# Patient Record
Sex: Female | Born: 1965 | Race: Black or African American | Hispanic: No | Smoking: Never smoker
Health system: Southern US, Community
[De-identification: ages and names within clinical notes are randomized; demographics above are authoritative.]

## PROBLEM LIST (undated history)

## (undated) DIAGNOSIS — M329 Systemic lupus erythematosus, unspecified: Secondary | ICD-10-CM

## (undated) DIAGNOSIS — I10 Essential (primary) hypertension: Secondary | ICD-10-CM

## (undated) DIAGNOSIS — M797 Fibromyalgia: Secondary | ICD-10-CM

## (undated) HISTORY — PX: ABDOMINAL HYSTERECTOMY: SHX81

## (undated) HISTORY — PX: ANKLE SURGERY: SHX546

## (undated) HISTORY — PX: CHOLECYSTECTOMY: SHX55

## (undated) HISTORY — PX: OTHER SURGICAL HISTORY: SHX169

---

## 1988-06-06 DIAGNOSIS — M329 Systemic lupus erythematosus, unspecified: Secondary | ICD-10-CM

## 1988-06-06 DIAGNOSIS — IMO0002 Reserved for concepts with insufficient information to code with codable children: Secondary | ICD-10-CM

## 1988-06-06 HISTORY — DX: Systemic lupus erythematosus, unspecified: M32.9

## 1988-06-06 HISTORY — DX: Reserved for concepts with insufficient information to code with codable children: IMO0002

## 2004-11-11 ENCOUNTER — Emergency Department: Payer: Self-pay | Admitting: Unknown Physician Specialty

## 2005-07-20 ENCOUNTER — Emergency Department: Payer: Self-pay | Admitting: Emergency Medicine

## 2006-01-27 ENCOUNTER — Ambulatory Visit: Payer: Self-pay

## 2006-02-21 ENCOUNTER — Ambulatory Visit: Payer: Self-pay | Admitting: Internal Medicine

## 2006-02-28 ENCOUNTER — Ambulatory Visit: Payer: Self-pay | Admitting: Specialist

## 2006-12-20 ENCOUNTER — Ambulatory Visit: Payer: Self-pay | Admitting: Pain Medicine

## 2007-01-07 ENCOUNTER — Emergency Department: Payer: Self-pay | Admitting: Internal Medicine

## 2007-01-10 ENCOUNTER — Ambulatory Visit: Payer: Self-pay

## 2007-06-16 ENCOUNTER — Emergency Department: Payer: Self-pay | Admitting: Emergency Medicine

## 2008-03-18 ENCOUNTER — Ambulatory Visit: Payer: Self-pay | Admitting: Family Medicine

## 2008-05-20 ENCOUNTER — Emergency Department: Payer: Self-pay | Admitting: Emergency Medicine

## 2008-07-13 ENCOUNTER — Emergency Department: Payer: Self-pay | Admitting: Internal Medicine

## 2008-12-31 ENCOUNTER — Emergency Department: Payer: Self-pay | Admitting: Unknown Physician Specialty

## 2009-02-24 ENCOUNTER — Ambulatory Visit: Payer: Self-pay | Admitting: Family Medicine

## 2010-03-19 ENCOUNTER — Emergency Department: Payer: Self-pay | Admitting: Internal Medicine

## 2010-04-13 ENCOUNTER — Inpatient Hospital Stay: Payer: Self-pay | Admitting: Unknown Physician Specialty

## 2010-04-21 LAB — PATHOLOGY REPORT

## 2011-05-20 ENCOUNTER — Emergency Department: Payer: Self-pay | Admitting: Emergency Medicine

## 2011-07-09 ENCOUNTER — Emergency Department: Payer: Self-pay | Admitting: Emergency Medicine

## 2011-07-29 ENCOUNTER — Emergency Department: Payer: Self-pay | Admitting: Emergency Medicine

## 2011-11-15 ENCOUNTER — Emergency Department: Payer: Self-pay | Admitting: Emergency Medicine

## 2011-12-11 ENCOUNTER — Ambulatory Visit: Payer: Self-pay | Admitting: Obstetrics

## 2012-02-24 ENCOUNTER — Inpatient Hospital Stay: Payer: Self-pay | Admitting: Internal Medicine

## 2012-02-24 LAB — COMPREHENSIVE METABOLIC PANEL
Albumin: 3.3 g/dL — ABNORMAL LOW (ref 3.4–5.0)
Alkaline Phosphatase: 97 U/L (ref 50–136)
Anion Gap: 10 (ref 7–16)
BUN: 7 mg/dL (ref 7–18)
Bilirubin,Total: 1.5 mg/dL — ABNORMAL HIGH (ref 0.2–1.0)
Calcium, Total: 9 mg/dL (ref 8.5–10.1)
Co2: 26 mmol/L (ref 21–32)
Creatinine: 0.77 mg/dL (ref 0.60–1.30)
EGFR (African American): >60
EGFR (Non-African Amer.): >60
Osmolality: 277 (ref 275–301)
Potassium: 3.6 mmol/L (ref 3.5–5.1)
Sodium: 140 mmol/L (ref 136–145)
Total Protein: 8.2 g/dL (ref 6.4–8.2)

## 2012-02-24 LAB — CBC
HCT: 39.4 % (ref 35.0–47.0)
HGB: 13.2 g/dL (ref 12.0–16.0)
MCH: 32.9 pg (ref 26.0–34.0)
MCHC: 33.6 g/dL (ref 32.0–36.0)
MCV: 98 fL (ref 80–100)
Platelet: 190 10*3/uL (ref 150–440)
RBC: 4.01 10*6/uL (ref 3.80–5.20)
WBC: 3.5 10*3/uL — ABNORMAL LOW (ref 3.6–11.0)

## 2012-02-24 LAB — URINALYSIS, COMPLETE
Blood: NEGATIVE
Ketone: NEGATIVE
Nitrite: NEGATIVE
Protein: 100
Specific Gravity: 1.044 (ref 1.003–1.030)
Squamous Epithelial: 21
WBC UR: 18 /HPF (ref 0–5)

## 2012-02-24 LAB — ACETAMINOPHEN LEVEL: Acetaminophen: 23 ug/mL

## 2012-02-24 LAB — LIPASE, BLOOD: Lipase: 389 U/L (ref 73–393)

## 2012-02-25 LAB — CBC WITH DIFFERENTIAL/PLATELET
Eosinophil %: 0 %
HGB: 10.9 g/dL — ABNORMAL LOW (ref 12.0–16.0)
Lymphocyte %: 12.1 %
MCH: 33.8 pg (ref 26.0–34.0)
Monocyte #: 0.1 x10 3/mm — ABNORMAL LOW (ref 0.2–0.9)
Monocyte %: 5.4 %
Neutrophil %: 82.2 %
Platelet: 116 10*3/uL — ABNORMAL LOW (ref 150–440)
WBC: 1.7 10*3/uL — CL (ref 3.6–11.0)

## 2012-02-25 LAB — COMPREHENSIVE METABOLIC PANEL
Albumin: 2.5 g/dL — ABNORMAL LOW (ref 3.4–5.0)
Alkaline Phosphatase: 68 U/L (ref 50–136)
Bilirubin,Total: 0.9 mg/dL (ref 0.2–1.0)
Calcium, Total: 7.7 mg/dL — ABNORMAL LOW (ref 8.5–10.1)
Glucose: 88 mg/dL (ref 65–99)
SGOT(AST): >2000 U/L — ABNORMAL HIGH (ref 15–37)

## 2012-02-25 LAB — RAPID HIV-1/2 QL/CONFIRM: HIV-1/2,Rapid Ql: NEGATIVE

## 2012-02-26 LAB — HEPATIC FUNCTION PANEL A (ARMC)
Albumin: 2 g/dL — ABNORMAL LOW (ref 3.4–5.0)
Alkaline Phosphatase: 64 U/L (ref 50–136)
SGOT(AST): 544 U/L — ABNORMAL HIGH (ref 15–37)
SGPT (ALT): 334 U/L — ABNORMAL HIGH (ref 12–78)
Total Protein: 5.5 g/dL — ABNORMAL LOW (ref 6.4–8.2)

## 2012-02-26 LAB — CBC WITH DIFFERENTIAL/PLATELET
HCT: 29.1 % — ABNORMAL LOW (ref 35.0–47.0)
HGB: 10 g/dL — ABNORMAL LOW (ref 12.0–16.0)
MCH: 33.6 pg (ref 26.0–34.0)
MCV: 98 fL (ref 80–100)
Platelet: 99 10*3/uL — ABNORMAL LOW (ref 150–440)
RBC: 2.97 10*6/uL — ABNORMAL LOW (ref 3.80–5.20)
Segmented Neutrophils: 76 %
WBC: 1 10*3/uL — CL (ref 3.6–11.0)

## 2012-02-26 LAB — PROTIME-INR
INR: 1.2
Prothrombin Time: 15.4 secs — ABNORMAL HIGH (ref 11.5–14.7)

## 2012-02-26 LAB — URINE CULTURE

## 2012-02-26 LAB — POTASSIUM: Potassium: 2.9 mmol/L — ABNORMAL LOW (ref 3.5–5.1)

## 2012-02-27 LAB — CBC WITH DIFFERENTIAL/PLATELET
Basophil #: 0 10*3/uL (ref 0.0–0.1)
Basophil %: 0.2 %
Eosinophil %: 0 %
HCT: 28.7 % — ABNORMAL LOW (ref 35.0–47.0)
HGB: 9.6 g/dL — ABNORMAL LOW (ref 12.0–16.0)
Lymphocyte %: 19.5 %
MCHC: 33.4 g/dL (ref 32.0–36.0)
MCV: 99 fL (ref 80–100)
Monocyte %: 10.9 %
Neutrophil #: 1 10*3/uL — ABNORMAL LOW (ref 1.4–6.5)
Platelet: 88 10*3/uL — ABNORMAL LOW (ref 150–440)
RDW: 15.2 % — ABNORMAL HIGH (ref 11.5–14.5)
WBC: 1.4 10*3/uL — CL (ref 3.6–11.0)

## 2012-02-27 LAB — COMPREHENSIVE METABOLIC PANEL
Albumin: 2.1 g/dL — ABNORMAL LOW (ref 3.4–5.0)
Anion Gap: 9 (ref 7–16)
BUN: 2 mg/dL — ABNORMAL LOW (ref 7–18)
Bilirubin,Total: 0.4 mg/dL (ref 0.2–1.0)
Chloride: 113 mmol/L — ABNORMAL HIGH (ref 98–107)
Creatinine: 0.56 mg/dL — ABNORMAL LOW (ref 0.60–1.30)
EGFR (African American): >60
Potassium: 3.8 mmol/L (ref 3.5–5.1)
SGOT(AST): 204 U/L — ABNORMAL HIGH (ref 15–37)
SGPT (ALT): 233 U/L — ABNORMAL HIGH (ref 12–78)
Sodium: 145 mmol/L (ref 136–145)
Total Protein: 5.7 g/dL — ABNORMAL LOW (ref 6.4–8.2)

## 2012-02-27 LAB — FOLATE: Folic Acid: 10 ng/mL (ref 3.1–100.0)

## 2012-02-27 LAB — PROTIME-INR: Prothrombin Time: 13.1 secs (ref 11.5–14.7)

## 2012-02-28 LAB — CBC WITH DIFFERENTIAL/PLATELET
Eosinophil #: 0 10*3/uL (ref 0.0–0.7)
Eosinophil %: 0 %
HCT: 31.4 % — ABNORMAL LOW (ref 35.0–47.0)
Lymphocyte #: 0.2 10*3/uL — ABNORMAL LOW (ref 1.0–3.6)
Lymphocyte %: 9 %
MCH: 32.8 pg (ref 26.0–34.0)
MCV: 99 fL (ref 80–100)
Monocyte #: 0.1 x10 3/mm — ABNORMAL LOW (ref 0.2–0.9)
Monocyte %: 4.1 %
Neutrophil #: 2 10*3/uL (ref 1.4–6.5)
Neutrophil %: 86.9 %
Platelet: 122 10*3/uL — ABNORMAL LOW (ref 150–440)
RDW: 14.7 % — ABNORMAL HIGH (ref 11.5–14.5)

## 2012-02-28 LAB — HEPATIC FUNCTION PANEL A (ARMC)
Albumin: 2.6 g/dL — ABNORMAL LOW (ref 3.4–5.0)
Alkaline Phosphatase: 116 U/L (ref 50–136)
Bilirubin, Direct: 0.2 mg/dL (ref 0.00–0.20)
Bilirubin,Total: 0.4 mg/dL (ref 0.2–1.0)
Total Protein: 6.3 g/dL — ABNORMAL LOW (ref 6.4–8.2)

## 2012-02-28 LAB — WBCS, STOOL

## 2012-02-29 LAB — COMPREHENSIVE METABOLIC PANEL
Albumin: 2.4 g/dL — ABNORMAL LOW (ref 3.4–5.0)
Alkaline Phosphatase: 93 U/L (ref 50–136)
Anion Gap: 8 (ref 7–16)
BUN: 4 mg/dL — ABNORMAL LOW (ref 7–18)
Creatinine: 0.66 mg/dL (ref 0.60–1.30)
EGFR (Non-African Amer.): >60
Glucose: 114 mg/dL — ABNORMAL HIGH (ref 65–99)
Potassium: 4.1 mmol/L (ref 3.5–5.1)
SGPT (ALT): 146 U/L — ABNORMAL HIGH (ref 12–78)
Sodium: 143 mmol/L (ref 136–145)
Total Protein: 6.1 g/dL — ABNORMAL LOW (ref 6.4–8.2)

## 2012-02-29 LAB — CBC WITH DIFFERENTIAL/PLATELET
Basophil #: 0 10*3/uL (ref 0.0–0.1)
Basophil %: 0.1 %
Eosinophil %: 0 %
HCT: 31 % — ABNORMAL LOW (ref 35.0–47.0)
Lymphocyte %: 5.3 %
MCH: 32.8 pg (ref 26.0–34.0)
Monocyte #: 0.5 x10 3/mm (ref 0.2–0.9)
Monocyte %: 6.8 %
Neutrophil %: 87.8 %
Platelet: 147 10*3/uL — ABNORMAL LOW (ref 150–440)

## 2012-03-02 LAB — CULTURE, BLOOD (SINGLE)

## 2012-03-13 ENCOUNTER — Observation Stay: Payer: Self-pay | Admitting: Internal Medicine

## 2012-03-13 LAB — SALICYLATE LEVEL: Salicylates, Serum: <1.7 mg/dL

## 2012-03-13 LAB — URINALYSIS, COMPLETE
Glucose,UR: NEGATIVE mg/dL (ref 0–75)
Nitrite: NEGATIVE
Ph: 5 (ref 4.5–8.0)
Protein: 30
RBC,UR: <1 /HPF (ref 0–5)
Specific Gravity: 1.046 (ref 1.003–1.030)
WBC UR: 2 /HPF (ref 0–5)

## 2012-03-13 LAB — COMPREHENSIVE METABOLIC PANEL
Albumin: 3.5 g/dL (ref 3.4–5.0)
BUN: 4 mg/dL — ABNORMAL LOW (ref 7–18)
EGFR (African American): >60
Glucose: 83 mg/dL (ref 65–99)
Potassium: 4.1 mmol/L (ref 3.5–5.1)
Sodium: 143 mmol/L (ref 136–145)
Total Protein: 7.8 g/dL (ref 6.4–8.2)

## 2012-03-13 LAB — CBC
HGB: 12.5 g/dL (ref 12.0–16.0)
MCH: 33.2 pg (ref 26.0–34.0)
MCV: 97 fL (ref 80–100)
RBC: 3.76 10*6/uL — ABNORMAL LOW (ref 3.80–5.20)

## 2012-03-13 LAB — DRUG SCREEN, URINE
Amphetamines, Ur Screen: NEGATIVE (ref ?–1000)
Barbiturates, Ur Screen: NEGATIVE (ref ?–200)
Benzodiazepine, Ur Scrn: NEGATIVE (ref ?–200)
Cannabinoid 50 Ng, Ur ~~LOC~~: NEGATIVE (ref ?–50)
Methadone, Ur Screen: NEGATIVE (ref ?–300)
Opiate, Ur Screen: NEGATIVE (ref ?–300)
Phencyclidine (PCP) Ur S: NEGATIVE (ref ?–25)
Tricyclic, Ur Screen: NEGATIVE (ref ?–1000)

## 2012-03-13 LAB — PROTIME-INR: Prothrombin Time: 12.8 secs (ref 11.5–14.7)

## 2012-03-13 LAB — ACETAMINOPHEN LEVEL: Acetaminophen: 18 ug/mL

## 2012-03-14 LAB — CBC WITH DIFFERENTIAL/PLATELET
Eosinophil %: 0.2 %
HGB: 11 g/dL — ABNORMAL LOW (ref 12.0–16.0)
Lymphocyte #: 0.5 10*3/uL — ABNORMAL LOW (ref 1.0–3.6)
Lymphocyte %: 19.6 %
MCV: 99 fL (ref 80–100)
Monocyte %: 11.2 %
Platelet: 168 10*3/uL (ref 150–440)
RBC: 3.34 10*6/uL — ABNORMAL LOW (ref 3.80–5.20)
RDW: 13 % (ref 11.5–14.5)
WBC: 2.6 10*3/uL — ABNORMAL LOW (ref 3.6–11.0)

## 2012-03-14 LAB — LIPASE, BLOOD: Lipase: 247 U/L (ref 73–393)

## 2012-03-14 LAB — COMPREHENSIVE METABOLIC PANEL
Anion Gap: 10 (ref 7–16)
BUN: 2 mg/dL — ABNORMAL LOW (ref 7–18)
Bilirubin,Total: 0.9 mg/dL (ref 0.2–1.0)
Calcium, Total: 7.4 mg/dL — ABNORMAL LOW (ref 8.5–10.1)
Chloride: 108 mmol/L — ABNORMAL HIGH (ref 98–107)
Co2: 23 mmol/L (ref 21–32)
Creatinine: 0.84 mg/dL (ref 0.60–1.30)
EGFR (African American): >60
EGFR (Non-African Amer.): >60
Osmolality: 276 (ref 275–301)
Potassium: 3.1 mmol/L — ABNORMAL LOW (ref 3.5–5.1)

## 2012-03-28 ENCOUNTER — Emergency Department: Payer: Self-pay | Admitting: Emergency Medicine

## 2012-04-03 ENCOUNTER — Ambulatory Visit: Payer: Self-pay | Admitting: Pain Medicine

## 2012-05-23 ENCOUNTER — Ambulatory Visit: Payer: Self-pay | Admitting: Pain Medicine

## 2012-12-11 DIAGNOSIS — M797 Fibromyalgia: Secondary | ICD-10-CM | POA: Insufficient documentation

## 2012-12-11 DIAGNOSIS — M329 Systemic lupus erythematosus, unspecified: Secondary | ICD-10-CM | POA: Insufficient documentation

## 2012-12-11 DIAGNOSIS — D61818 Other pancytopenia: Secondary | ICD-10-CM | POA: Insufficient documentation

## 2013-01-10 DIAGNOSIS — M2550 Pain in unspecified joint: Secondary | ICD-10-CM | POA: Insufficient documentation

## 2013-03-01 ENCOUNTER — Emergency Department: Payer: Self-pay | Admitting: Emergency Medicine

## 2013-03-01 LAB — CBC WITH DIFFERENTIAL/PLATELET
Basophil #: 0 10*3/uL (ref 0.0–0.1)
Basophil %: 0.4 %
Eosinophil #: 0 10*3/uL (ref 0.0–0.7)
Eosinophil %: 0.3 %
HCT: 33.6 % — ABNORMAL LOW (ref 35.0–47.0)
HGB: 11.7 g/dL — ABNORMAL LOW (ref 12.0–16.0)
Lymphocyte #: 0.5 10*3/uL — ABNORMAL LOW (ref 1.0–3.6)
Lymphocyte %: 21 %
MCH: 38 pg — ABNORMAL HIGH (ref 26.0–34.0)
MCHC: 34.8 g/dL (ref 32.0–36.0)
MCV: 109 fL — ABNORMAL HIGH (ref 80–100)
Monocyte #: 0.3 x10 3/mm (ref 0.2–0.9)
Monocyte %: 10.7 %
Neutrophil #: 1.8 10*3/uL (ref 1.4–6.5)
Neutrophil %: 67.6 %
Platelet: 175 10*3/uL (ref 150–440)
RBC: 3.07 10*6/uL — ABNORMAL LOW (ref 3.80–5.20)
RDW: 13.2 % (ref 11.5–14.5)
WBC: 2.6 10*3/uL — ABNORMAL LOW (ref 3.6–11.0)

## 2013-03-01 LAB — COMPREHENSIVE METABOLIC PANEL
Albumin: 3.7 g/dL (ref 3.4–5.0)
Alkaline Phosphatase: 80 U/L (ref 50–136)
Anion Gap: 6 — ABNORMAL LOW (ref 7–16)
BUN: 9 mg/dL (ref 7–18)
Bilirubin,Total: 1.1 mg/dL — ABNORMAL HIGH (ref 0.2–1.0)
Calcium, Total: 8.5 mg/dL (ref 8.5–10.1)
Chloride: 105 mmol/L (ref 98–107)
Co2: 26 mmol/L (ref 21–32)
Creatinine: 0.93 mg/dL (ref 0.60–1.30)
EGFR (African American): >60
EGFR (Non-African Amer.): >60
Glucose: 93 mg/dL (ref 65–99)
Osmolality: 272 (ref 275–301)
Potassium: 3.8 mmol/L (ref 3.5–5.1)
SGOT(AST): 27 U/L (ref 15–37)
SGPT (ALT): 19 U/L (ref 12–78)
Sodium: 137 mmol/L (ref 136–145)
Total Protein: 7.8 g/dL (ref 6.4–8.2)

## 2013-03-01 LAB — LIPASE, BLOOD: Lipase: 256 U/L (ref 73–393)

## 2013-04-29 DIAGNOSIS — E538 Deficiency of other specified B group vitamins: Secondary | ICD-10-CM | POA: Insufficient documentation

## 2013-07-02 IMAGING — CT CT ABD-PELV W/ CM
1 of 2 series · 15 of 32 positions shown, 19 images · non-contrast
Comparison: none

REASON FOR EXAM: (1) severe abd pain, vomiting; (2) severe abd pain,
vomiting
COMMENTS:

[Series 2: soft tissue · axial · 0.69mm/px · z∈[-456,-38]mm · 15 of 153 slices shown, 19 images]
[im 7/153  soft-tissue]
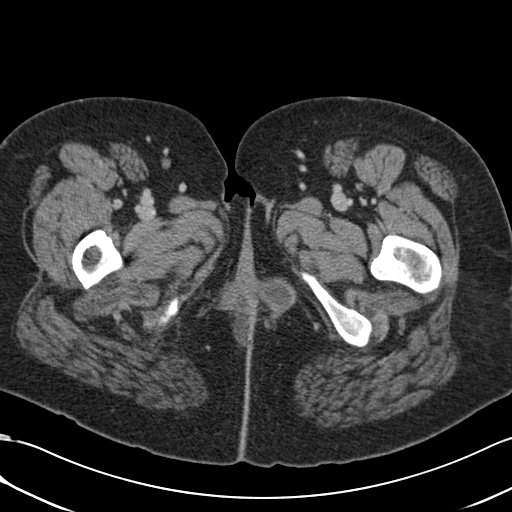
[im 7/153  bone]
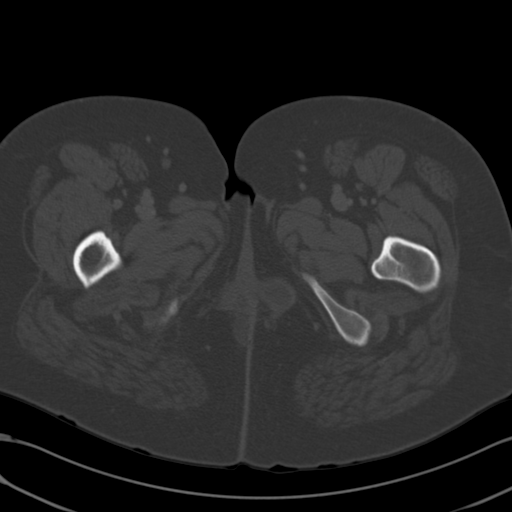
[im 19/153  soft-tissue]
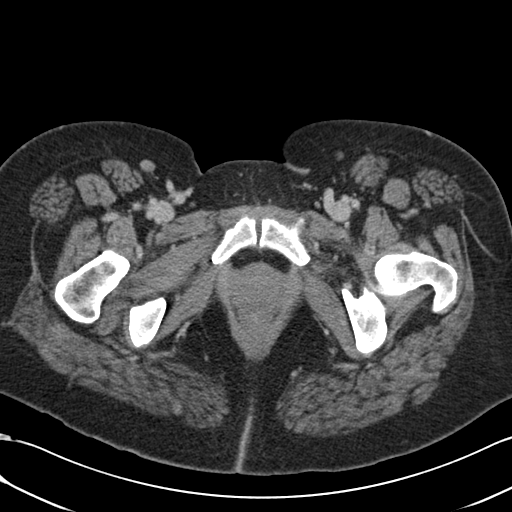
[im 31/153  soft-tissue]
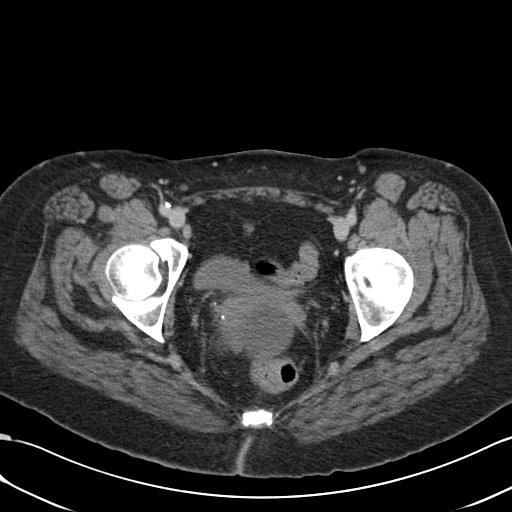
[im 43/153  soft-tissue]
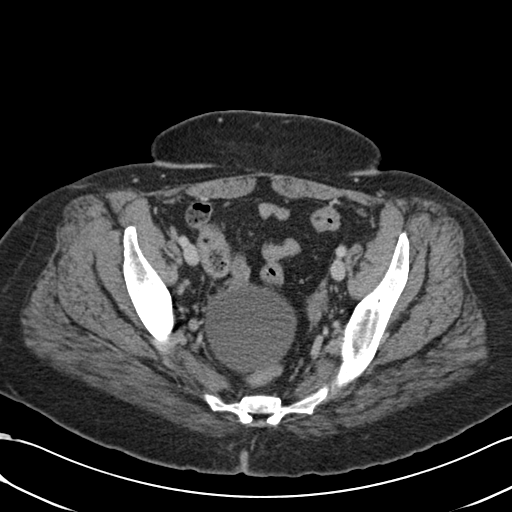
[im 55/153  soft-tissue]
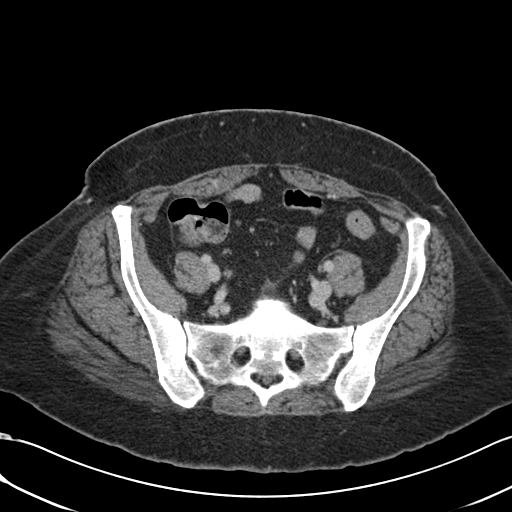
[im 67/153  soft-tissue]
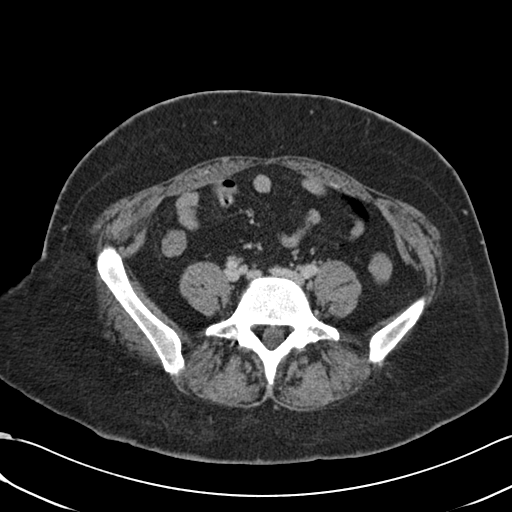
[im 80/153  soft-tissue]
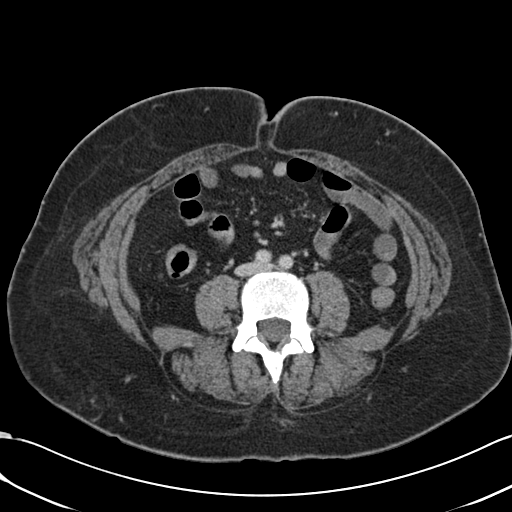
[im 86/153  soft-tissue]
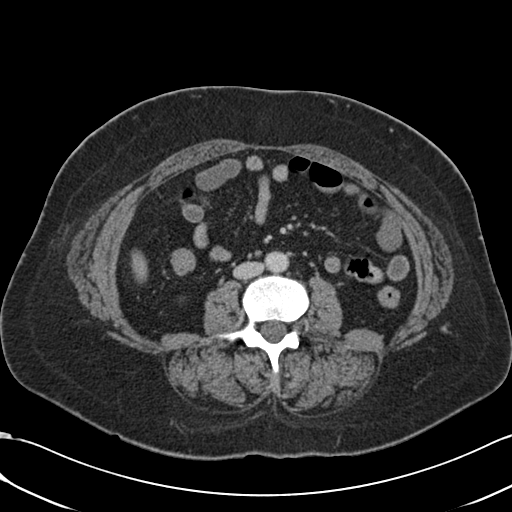
[im 98/153  soft-tissue]
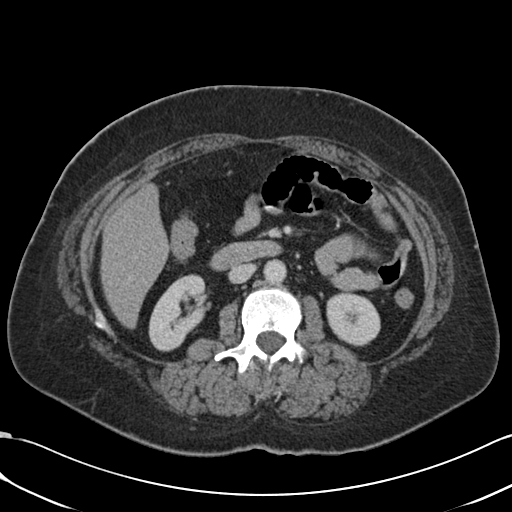
[im 98/153  bone]
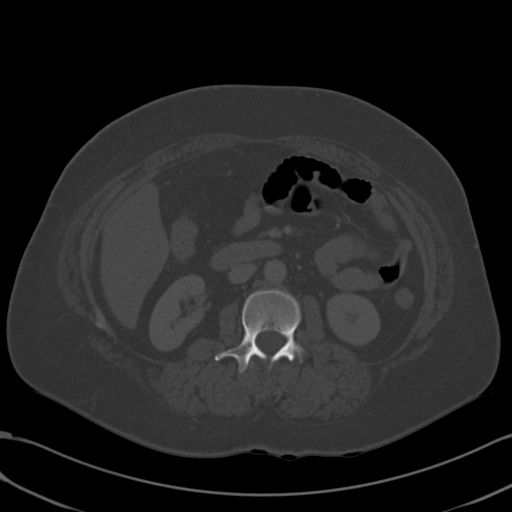
[im 110/153  soft-tissue]
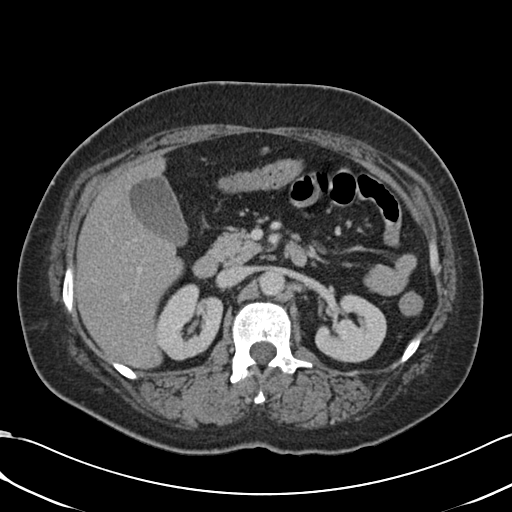
[im 122/153  soft-tissue]
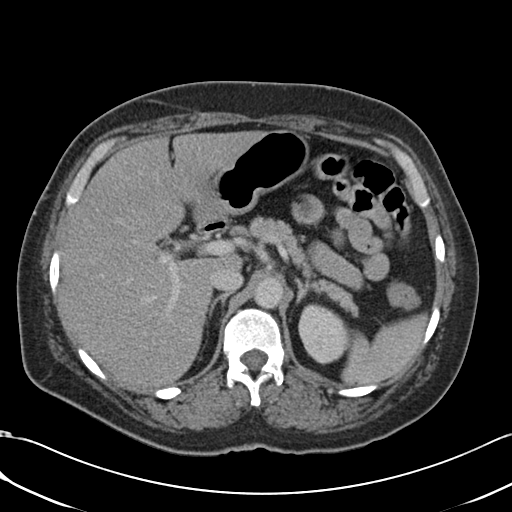
[im 128/153  lung]
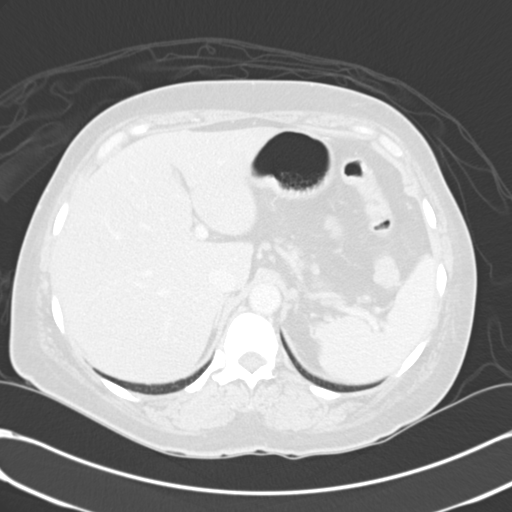
[im 134/153  soft-tissue]
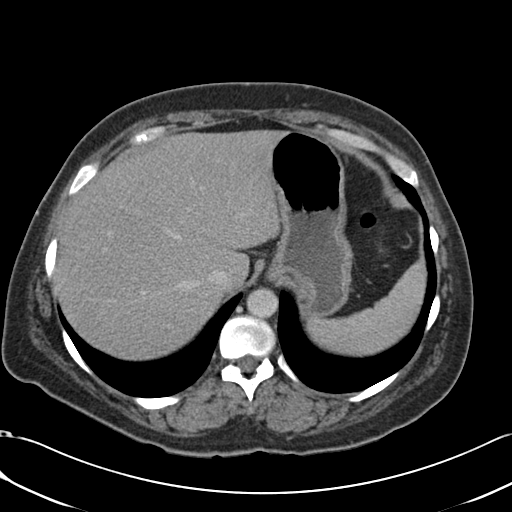
[im 134/153  lung]
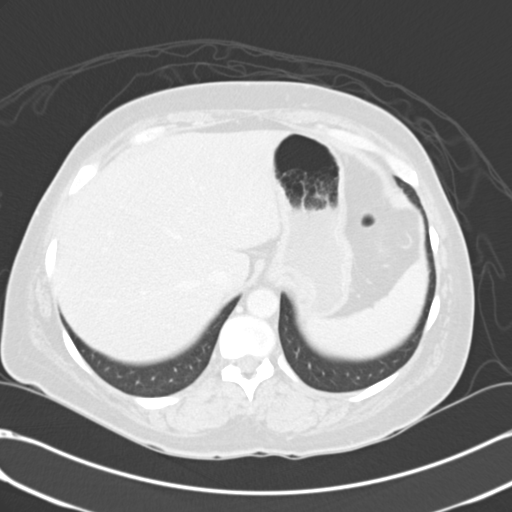
[im 140/153  lung]
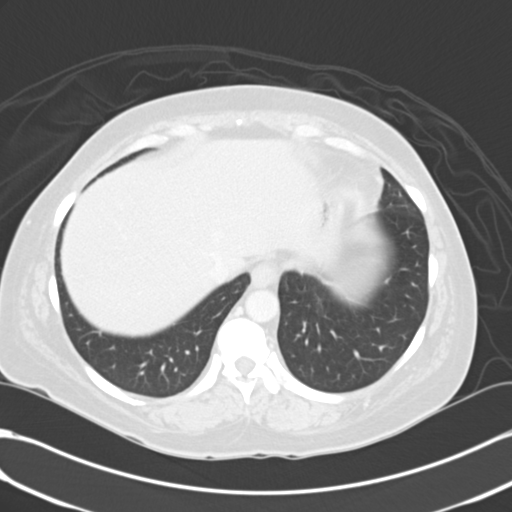
[im 146/153  soft-tissue]
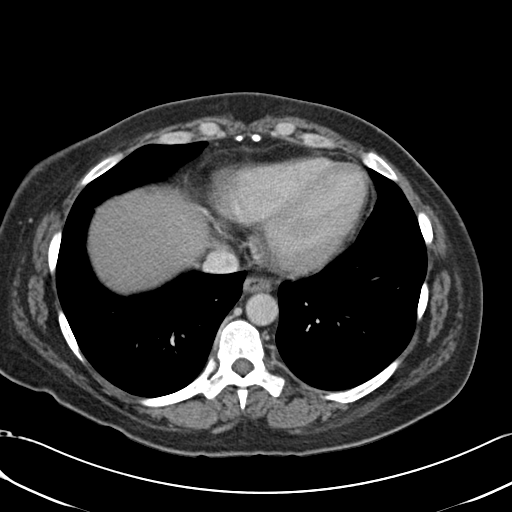
[im 146/153  lung]
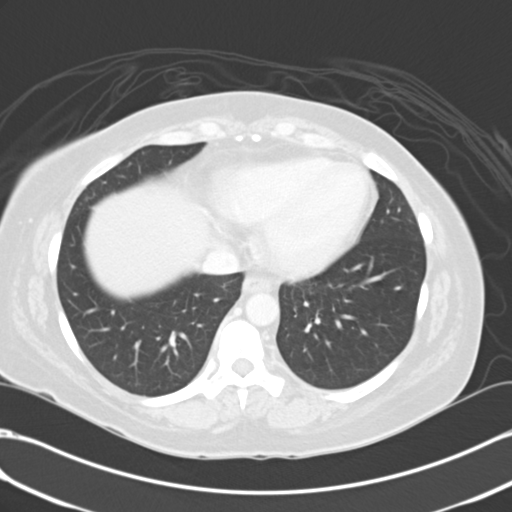

[15 of 32 positions shown; findings below may reference images not displayed]

PROCEDURE:     CT  - CT ABDOMEN / PELVIS  W  - March 13, 2012  [DATE]

RESULT:     CT of the abdomen and pelvis is performed with 85 mL of
Jsovue-WMM iodinated intravenous contrast. Images are reconstructed at 3 mm
slice thickness in the axial plane. Syngo Via is utilized for further
assessment with multiplanar reconstruction. There is previous study dated 13 April, 2010.

There is a fluid collection with a Hounsfield reading of 7 in the region of
the uterus anterior to the rectum measuring 7.75 x 6.52 x 5.71 cm. There is
no definite hematocrit effect. No air is seen within this fluid collection.
The appendix is normal. No abnormal bowel distention is present. Small
stones are seen in the dependent portion the gallbladder. The liver, spleen,
pancreas, adrenal glands and kidneys appear to be unremarkable. No
adenopathy is evident. No pneumoperitoneum or pneumatosis is evident 3 small
fat filled umbilical hernia is present. The urinary bladder contains a
minimal amount of urine. Bony structures appear unremarkable.
IMPRESSION: Midline fluid collection the prerectal region. No air-fluid
level is seen. No thickened wall is seen to suggest an abscess. Pelvic
ultrasound may be beneficial for further clarification. Rib there is
reportedly previous hysterectomy. Normal-appearing appendix is seen adjacent
to this. Small fat filled umbilical hernia present. Cholelithiasis is
demonstrated. The lung bases appear clear.

[REDACTED]

## 2013-07-08 DIAGNOSIS — I10 Essential (primary) hypertension: Secondary | ICD-10-CM | POA: Insufficient documentation

## 2013-07-08 DIAGNOSIS — N644 Mastodynia: Secondary | ICD-10-CM | POA: Insufficient documentation

## 2013-10-09 DIAGNOSIS — F4310 Post-traumatic stress disorder, unspecified: Secondary | ICD-10-CM | POA: Insufficient documentation

## 2013-12-16 ENCOUNTER — Emergency Department: Payer: Self-pay | Admitting: Emergency Medicine

## 2013-12-23 DIAGNOSIS — G4700 Insomnia, unspecified: Secondary | ICD-10-CM | POA: Insufficient documentation

## 2014-05-04 ENCOUNTER — Inpatient Hospital Stay: Payer: Self-pay | Admitting: Orthopedic Surgery

## 2014-05-04 LAB — URINALYSIS, COMPLETE
BILIRUBIN, UR: NEGATIVE
BLOOD: NEGATIVE
Bacteria: NONE SEEN
GLUCOSE, UR: NEGATIVE mg/dL (ref 0–75)
KETONE: NEGATIVE
NITRITE: NEGATIVE
PROTEIN: NEGATIVE
Ph: 7 (ref 4.5–8.0)
RBC,UR: 7 /HPF (ref 0–5)
Specific Gravity: 1.01 (ref 1.003–1.030)
WBC UR: <1 /HPF (ref 0–5)

## 2014-05-04 LAB — BASIC METABOLIC PANEL
ANION GAP: 8 (ref 7–16)
BUN: 13 mg/dL (ref 7–18)
Calcium, Total: 8.4 mg/dL — ABNORMAL LOW (ref 8.5–10.1)
Chloride: 109 mmol/L — ABNORMAL HIGH (ref 98–107)
Co2: 23 mmol/L (ref 21–32)
Creatinine: 0.9 mg/dL (ref 0.60–1.30)
EGFR (African American): >60
EGFR (Non-African Amer.): >60
GLUCOSE: 98 mg/dL (ref 65–99)
OSMOLALITY: 279 (ref 275–301)
Potassium: 4.5 mmol/L (ref 3.5–5.1)
Sodium: 140 mmol/L (ref 136–145)

## 2014-05-04 LAB — CBC
HCT: 33.9 % — ABNORMAL LOW (ref 35.0–47.0)
HGB: 11.2 g/dL — ABNORMAL LOW (ref 12.0–16.0)
MCH: 30.1 pg (ref 26.0–34.0)
MCHC: 33 g/dL (ref 32.0–36.0)
MCV: 91 fL (ref 80–100)
Platelet: 222 10*3/uL (ref 150–440)
RBC: 3.72 10*6/uL — ABNORMAL LOW (ref 3.80–5.20)
RDW: 12.3 % (ref 11.5–14.5)
WBC: 6.1 10*3/uL (ref 3.6–11.0)

## 2014-05-04 LAB — ETHANOL: Ethanol: <3 mg/dL

## 2014-05-04 LAB — PROTIME-INR
INR: 0.9
PROTHROMBIN TIME: 12.2 s (ref 11.5–14.7)

## 2014-05-08 LAB — PLATELET COUNT: Platelet: 234 10*3/uL (ref 150–440)

## 2014-05-08 LAB — BASIC METABOLIC PANEL
Anion Gap: 8 (ref 7–16)
BUN: 5 mg/dL — AB (ref 7–18)
CHLORIDE: 108 mmol/L — AB (ref 98–107)
CREATININE: 1.02 mg/dL (ref 0.60–1.30)
Calcium, Total: 7.9 mg/dL — ABNORMAL LOW (ref 8.5–10.1)
Co2: 24 mmol/L (ref 21–32)
EGFR (African American): >60
EGFR (Non-African Amer.): >60
Glucose: 124 mg/dL — ABNORMAL HIGH (ref 65–99)
OSMOLALITY: 278 (ref 275–301)
Potassium: 3.5 mmol/L (ref 3.5–5.1)
Sodium: 140 mmol/L (ref 136–145)

## 2014-05-08 LAB — HEMOGLOBIN: HGB: 9.8 g/dL — ABNORMAL LOW (ref 12.0–16.0)

## 2014-09-23 NOTE — Consult Note (Signed)
PATIENT NAME:  Ana [name], CAPERS MR#:  [id number] DATE OF BIRTH:  1966-03-13  DATE OF CONSULTATION:  02/25/2012  REFERRING PHYSICIAN:   CONSULTING PHYSICIAN:  Lupita Dawn. Genee Rann, MD  REASON FOR REFERRAL: Abnormal liver enzymes.   DESCRIPTION: The patient is a 49 year old black female with a known history of lupus and fibromyalgia causing diffuse body aches and abdominal pains. Because of insurance the patient has seen various different doctors in different locations. Unfortunately, she stopped taking lupus medication several months ago. She does not know the names of the medications. Also, because of the pain, she started taking her at least 2 grams of Tylenol every six hours for the past four months because of fibromyalgia pain. The patient now comes in with at least several days of low abdominal pain associated with nausea and diarrhea and fevers. The patient was found to have very abnormal liver enzymes which are getting worse. I was consulted because of this.   The patient has chronic history of diarrhea and abdominal pain in the past. She was hospitalized back in 2011 where she had GI work-up. Ultrasound at that time showed gallstones without evidence of cholecystitis. Colonoscopy was normal with normal biopsies. When she came in this time ultrasound again showed gallstones but no signs of cholecystitis. She has decreased appetite and has lost about 30 pounds over the last few months. Yesterday temperature was up to 101. She is complaining of some dark cloudy urine as well. Other complaints include headaches and dizziness as well as muscle tenderness.   PAST MEDICAL HISTORY:  1. Lupus. 2. Fibromyalgia.  3. Hypertension. 4. Hyperlipidemia.   SOCIAL HISTORY: She denies any drinking or smoking or using illicit drugs.   FAMILY HISTORY: Notable for diabetes in her mother.   PAST SURGICAL HISTORY: There is no prior surgical history.  MEDICATIONS: The only medication she takes at home is Tylenol  Extra-Strength 500 mg four tablets every six hours as needed.   ALLERGIES: She has no known drug allergies.   REVIEW OF SYMPTOMS: Again, she has had fevers and chills and headaches and dizziness and generalized weakness. There are no visual or hearing changes. There is no coughing or shortness of breath. There is no chest pain or palpitations. The GI symptoms have been described already. The patient denies any gross hematochezia or melena. She has had some dark urine but no dysuria. Her muscles ache all over.  PHYSICAL EXAMINATION:   GENERAL: The patient is in no acute distress.   VITAL SIGNS: She was afebrile. Vital signs were stable. Initial blood pressure was 134/66.   HEENT: Normocephalic, atraumatic head. Pupils are equally reactive. Throat was clear.   NECK: Supple.   CARDIAC: Regular rhythm and rate without murmurs.   RESPIRATORY: Lungs are clear bilaterally. She has diffuse tenderness, but is mostly in the lower abdomen, not in the right upper quadrant area. There is no rebound or guarding.   EXTREMITIES: No clubbing, cyanosis, or edema.   SKIN: Negative.   NEUROLOGICAL: Examination was nonfocal.  LABORATORY, DIAGNOSTIC AND RADIOLOGIC DATA: Ultrasound showed multiple gallstones and some sludge, but again there is no evidence of acute cholecystitis.   Urinalysis showed 18 WBCs, nitrites were negative.   Sodium 140, potassium 3.6, chloride 104, CO2 26, BUN 7, and creatinine 0.77. Bilirubin 1.5, AST 1676 and ALT 342,000. Alkaline phosphatase is normal at 97. White count is 3.5, hemoglobin 13.2, and platelet count is 190. This morning her liver enzymes are worse. AST is greater than 2000 and  ALT is 701. Lipase was 389. Tylenol level was 23.   ASSESSMENT AND PLAN: This is a patient with acute hepatitis. AST or ALT is greater than 1000 we need to be worried about toxins from medications. Even though the Tylenol level was within normal limits, she has been taking high doses of  Tylenol for the past four months, therefore, we should be worried about Tylenol overdose. I agree with starting the patient on acetylcysteine drip. Would also need to rule out other causes such as viral hepatitis or even autoimmune hepatitis in light of her lupus history. It is important that we monitor her liver enzymes closely on a daily basis. I will also recommend checking daily pro time level as well as platelet count. We also need to monitor her mental status closely. If these functions worsen over the next few days, I would recommend that she be transferred to [facility] hepatology division for further evaluation. Thank you for the referral.  ____________________________ Lupita Dawn. Candace Cruise, MD pyo:slb D: 02/26/2012 08:30:00 ET T: 02/26/2012 12:18:00 ET JOB#: [id number]  cc: Lupita Dawn. Candace Cruise, MD, <Dictator> Lupita Dawn Braylynn Lewing MD ELECTRONICALLY SIGNED 02/27/2012 11:02

## 2014-09-23 NOTE — Consult Note (Signed)
PATIENT NAME:  Ana [name], Ana [name] MR#:  [id number] DATE OF BIRTH:  [birth year]  DATE OF CONSULTATION:  02/27/2012  REFERRING PHYSICIAN:  Dr Karsten Fells.   CONSULTING PHYSICIAN:  Emmaline Kluver., MD PERSONAL PHYSICIAN: Ashok Norris, MD   REASON FOR CONSULTATION: Lupus.   HISTORY OF PRESENT ILLNESS: A 49 year old [place] American female. She has had prior discoid lupus and longstanding alopecia. She was told at age 30 with hair loss, fever, and positive ANA, she had lupus. She said she was placed on some medicines at that time, may be Plaquenil and methotrexate but not Imuran or Cytoxan. She did not have definite kidney disease. She had been followed at [facility]. She had been on chronic analgesics. In direct questioning she thinks she has heard of CellCept, and she used to be on Plaquenil but taken off that. She was on prednisone for years. She used see Dr. Jerrol Banana, was transferred to Internal Medicine upon his retirement. She has not taken prednisone for a good 3 months or more, does not remember the other drugs she was on except analgesics. She has recently had nausea, vomiting, diarrhea, lost 30 pounds of weight, some episodic fever. She has had prior workup for abnormal liver functions and prior workup for abnormal blood counts by her report. She has had recent hospitalization with nausea and vomiting. Last GOT was greater than 2000. Creatinine was normal. CRP mildly elevated. Pro time normal. When she came in her white count was 3500; it has dropped to 1400, and hemoglobin has dropped to 9.6, platelets have dropped to 88,000. She has been on antibiotics here. Anti-DNA negative.  C4 normal C3 of 68.  FANA positive. CRP 30. Gallbladder ultrasound showed gallstones and possible fatty liver. She has 100 mg/dL protein in the urine. Seen has seen Dr. Holley Raring in the past for "urinary tract infections." She has some muscle aches. No definite recent skin rash, shortness of breath, chest pain, or joint swelling.   PAST  MEDICAL HISTORY:  Depression, lupus, bipolar, hysterectomy, C-section.   MEDICATIONS:  Was initially given acetylcysteine drip for possible Tylenol toxicity. She is on Cipro, Flagyl, morphine, is now eating some, no vomiting, had fever when she came in, now normal. She was placed on 10 mg of prednisone. She had not had any recurrent fevers.   SOCIAL HISTORY: Denies significant cigarettes or alcohol.   FAMILY HISTORY: Negative for the connective tissue diseases.   REVIEW OF SYSTEMS: As above.   PHYSICAL EXAMINATION:  GENERAL: Pleasant female in no acute distress.   VITAL SIGNS: Temperature 98, pulse 77, respiratory rate 20, 143/94, oxygen 97.   SKIN: With alopecia. No active discoid lesions of the face. No telangiectasias of the hands. No malar rash. Oropharynx clear.   NECK: No thyromegaly.   CHEST: Clear.   HEART: No murmur.   ABDOMEN: Diffuse abdominal tenderness, positive bowel sounds.   EXTREMITIES: No significant edema.   SKELETAL: Hands without synovitis. Elbows move well. Small cool knee effusions. Hips move well. Walks without cane or walker.   PSYCHIATRIC: Normal mentation.   IMPRESSION:  1. Prior history of lupus. Not sure that this current illness is that. Her DNA is negative. C3, C4 are normal. CRP is not that high. She was not on chronic prednisone.  2. Possibly recent weight loss contributed to by been relatively addisonian given her prior history of chronic steroids and then being off of them.  3. Abdominal pain with transaminitis. Ultrasound shows fatty liver that she said she has had  abnormal liver functions before but obviously had an acute insult with a transaminase greater than 2000, now coming down to normal.  4. Pancytopenia. She said she has had trouble with her blood counts before, do not know whether this is new or old. If this were immune, she might respond on steroids. If this is a burned out marrow from prior drugs or some other drug medicine toxicity  then she could have other hematologic diseases.     PLAN: Agree with transfer to [facility]. Would boost the prednisone at 60. Watch her heme counts.  If she stays here and does not come up we have Hematology see. Sent other labs, serum protein electrophoresis, B12, folate, haptoglobin, LDH, Coombs.    Thank you.    ____________________________ Emmaline Kluver., MD gwk:vtd D: 02/27/2012 14:00:42 ET T: 02/27/2012 14:17:14 ET JOB#: [id number]  cc: Emmaline Kluver., MD, <Dictator> Ashok Norris, MD Lupita Dawn. Candace Cruise, MD Ovidio Hanger MD ELECTRONICALLY SIGNED 02/27/2012 18:14

## 2014-09-23 NOTE — Discharge Summary (Signed)
PATIENT NAME:  Ana Ana [name], Ana Ana [name] MR#:  [id number] DATE OF BIRTH:  [birth year]  DATE OF ADMISSION:  03/13/2012 DATE OF DISCHARGE:  03/15/2012  DISCHARGE DIAGNOSES:  1. Mild pancreatitis resolved.  2. Fibromyalgia. 3. Systemic lupus erythematosus.  4. Hypertension.  5. Chronic pain syndrome. 6. History of overuse of Tylenol.   DISPOSITION: The patient is being discharged home. Outpatient follow-up for the patient at [facility] with Dr. Dwyane Dee and follow-up with pain management clinic has been made for the patient. The patient has been given the address, phone number, appointment times and dates for both the appointments.   DIET: Low sodium, low fat, low cholesterol.   ACTIVITY: As tolerated.   DISCHARGE MEDICATIONS:  1. Prednisone 10 mg daily.  2. Oxycodone 10 mg q.6 hours p.r.n.  3. Tramadol 50 mg every six hours p.r.n.  4. Promethazine 25 mg every six hours p.r.n.   RESULTS: CT of the abdomen showed no acute abnormalities No evidence of pancreatitis. Tylenol and salicylate levels were within normal. Urinalysis showed no evidence of infection. CBC showed white count of 3.3 to 2.6, hemoglobin 12.6 to 11, platelet count normal. Lipase 695 on admission, 247 by the time of discharge. AST 46, normal by the time of discharge. Urine drug screen negative. The rest of BMP normal.   HOSPITAL COURSE: The patient is Ana [name] 49 year old female with past medical history of hypertension, not on any medications, SLE, fibromyalgia and depression who had Ana [name] prolonged hospitalization in September 2013 for Tylenol overdose related to chronic ingestion of high doses of Tylenol. She has history of lupus and fibromyalgia and had stopped following at [facility] several months ago because she was not getting any prescription for narcotic pain medications. She was overtreating herself at that time with Tylenol. During that hospitalization in September she had received Mucomyst. She was also pancytopenic, possibly felt to  be due to her underlying lupus. Dr. Jefm [name] had seen her during that admission and had recommended treatment with steroids and tapering the dose to prednisone 10 mg which she was supposed to continue. The patient appears to have stopped taking that medication as an outpatient. She was also advised to follow-up at [facility] for her complicated medical problems, but did not do so. During this admission she presented with abdominal pain and reported fever. Throughout the hospitalization she has remained afebrile. She was found to found to have mildly elevated lipase and admitted as acute pancreatitis. Within 24 hours her lipase had normalized. Her CT scan was normal. Her LFTs were normal. She had no abdominal pain, nausea, vomiting or fever. Her diet was advanced which she was tolerating without any difficulty. Her blood pressure was stable off any medications. The patient has been restarted on prednisone and advised to take that. The patient acknowledged that she had pain control issues. She went to different hospitals whenever she ran out of her narcotic pain medications and possibly went into withdrawal. She is interested in following up at pain management clinic and having outpatient control of her chronic pain issues. She reported that she was trying to get an appointment at [facility], but was unable to do so. During this admission the pain clinic has been contacted and an appointment for the patient with Dr. Dossie Arbour at the pain clinic has been made. Ana [name] follow-up appointment for the patient to see Dr. Dwyane Dee at [facility] has also been made. The patient has been given details about the appointment dates, times, addresses and phone  numbers of the clinic and advised to be compliant with the medications. She has been given Ana [name] prescription for narcotic pain medications until she can follow up at the pain management clinic on 10/21 at 8:00 Ana [name].m.   TIME SPENT: 45 minutes.     ____________________________ Cherre Huger, MD sp:ap D: 03/15/2012 12:24:29 ET T: 03/15/2012 13:42:17 ET JOB#: [id number]  cc: Cherre Huger, MD, <Dictator> Dr. Dwyane Dee, [facility] Cherre Huger MD ELECTRONICALLY SIGNED 03/16/2012 12:08

## 2014-09-23 NOTE — H&P (Signed)
PATIENT NAME:  Ana [name], BRANSCOM MR#:  [id number] DATE OF BIRTH:  August 27, 1965  DATE OF ADMISSION:  02/24/2012  PRIMARY CARE PHYSICIAN: None local. REFERRING PHYSICIAN: Dr Bonney Roussel.  CHIEF COMPLAINT: Abdominal pain, nausea, vomiting for 2 months, worsening for 4 days.   HISTORY OF PRESENT ILLNESS: A 49 year old [place] American female with a history of hypertension, hyperlipidemia, fibromyalgia who presented to the ED with abdominal pain, nausea, and vomiting for the past 2 months and worsening for the past 4 days. The patient complains of abdominal pain, which is the lower part, aching, constant, radiation to the back associated with nausea, vomiting for the past 2 months. In addition, she has diarrhea which is loose multiple times a day for the past 2 months. She said that she is unable to eat, and lost weight for about 30 pounds. She also has a fever, chills. Yesterday temperature was 101. She says that she has dark cloudy urine. She also complain of headache, dizziness, muscle tenderness.  PAST MEDICAL HISTORY: Hypertension, hyperlipidemia, lupus, fibromyalgia, depression, obesity.   SOCIAL HISTORY: Denies any smoking or drinking or illicit drugs.   FAMILY HISTORY:  Mother has diabetes. No heart attack, stroke, or hypertension or cancer in her family.   PAST SURGICAL HISTORY: No.    HOME MEDICATIONS:  Tylenol Caplets Extra Strength 500 mg p.o. 4 tablets every 6 hours p.r.n.   ALLERGIES: No.    REVIEW OF SYSTEMS: CONSTITUTIONAL: The patient has a fever, chills, headache, dizziness, generalized weakness. EYES: No double vision or blurred vision. ENT: No epistaxis, postnasal drip, or slurred speech. RESPIRATORY: No cough, sputum, shortness of breath, or hematemesis. CARDIOVASCULAR: No chest pain, palpitation, orthopnea, or nocturnal dyspnea. No leg edema. EYES: Positive for abdominal pain, nausea, vomiting, diarrhea. No melena or bloody stool. GENITOURINARY: No dysuria, hematuria, or incontinence,  but has dark urine, cloudy urine. ENDOCRINE: No polyuria, polydipsia. HEMATOLOGY: No easy bruising or bleeding. MUSCULOSKELETAL: Has some muscle pain. No leg edema. NEUROLOGY: No syncope, loss of consciousness, or seizure. SKIN: No rash or jaundice.   PHYSICAL EXAMINATION:  VITAL SIGNS: Temperature 99, blood pressure 134/66, pulse 96, O2 saturation 99% in room air.   GENERAL: The patient is alert, awake, oriented, in no acute distress.   HEENT: Pupils round, equal, reactive to light and accommodation.    NECK: Supple. No JVD or carotid bruit. No adenopathy. No thyromegaly. Moist oral mucosa. Clear oropharynx.   CARDIOVASCULAR: S1, S2, regular rate, rhythm. No murmurs or gallops.   PULMONARY: Bilateral air entry. No wheezing or rales.   ABDOMEN: Soft. No distention. Has diffuse tenderness. No rigidity. No rebound. No organomegaly.   EXTREMITIES: No edema, clubbing, or cyanosis but has extremity tenderness on the back tenderness all over. Strong bilateral pedal pulses.   SKIN: No rash or jaundice.   NEUROLOGY: Alert and oriented x3. No focal deficit. Power 5/5. Sensation intact. Deep tendon reflexes 2+.   STUDIES: Ultrasound of abdomen showed multiple gallstones present as well as sludge.  No  evidence of acute cholecystitis. Urinalysis showed WBC 18, RBCs 3, nitrite is negative. CBC showed WBC 3.5, hemoglobin 13.2, platelets 190,000. Glucose 99, BUN 7, creatinine 0.77. Electrolytes normal. Bilirubin 1.5, SGPT 342, SGOT 1676, lipase 389. Acetaminophen 23.   IMPRESSION:  1. Abdominal pain with nausea, vomiting, diarrhea, unknown etiology. Need to rule out colitis.  2. Urinary tract infection.  3. Abnormal liver function tests.  4. Cholelithiasis.   5. Hypertension, controlled.  6. Fibromyalgia. 7. Lupus.   8. Hyperlipidemia.  9.  Obesity.   PLAN OF TREATMENT:  1. The patient will be admitted to the medical floor. We will start clear liquid, and we will get a GI consult and follow up  complete metabolic panel, hepatitis panel, HIV test, stool culture and sensitivity, stool Clostridium difficile, and stool ova and parasites.  2. We will give morphine and Zofran p.r.n. for abdominal pain, nausea, and vomiting.  3. Will give Cipro, Flagyl to treat urinary tract infection and a possible colitis and follow up a urine culture.  4. Gastrointestinal and deep vein thrombosis prophylaxis. Discussed the patient's situation and plan of treatment with the patient.   TIME SPENT: About 58 minutes.   ____________________________ Demetrios Loll, MD qc:vtd D: 02/24/2012 19:53:20 ET T: 02/25/2012 07:50:10 ET JOB#: [id number]  cc: Demetrios Loll, MD, <Dictator> Demetrios Loll MD ELECTRONICALLY SIGNED 02/27/2012 13:04

## 2014-09-23 NOTE — Consult Note (Signed)
Pt seen and examined. Full consult to follow.Pt with lupus and fibromyalgia. No lupus meds for several months. Taking 2g tylenol q 6hrs x 4 months for fibromyalgia pain. Now with low abd pain, nausea, diarrhea, fever, etc. LFT high and getting worse. Pt with chronic  hx of diarrhea and abd pain. W/U in 2011 incl U/S showing gallstones. Colonoscopy in 2011 was normal with normal biopsies. U/S again shows gallstones but no evidence of cholecystitis. Agree with blood cx and stool cx. Agree with braod spectrum Abx for now. Agree with acetylcysteine drip for tylenol overdose, even if tylenol level normal. R/O other causes, such as viral hepatitis, autoimmune hepatitis, lupus flare, etc. For moniter LFT closely, daily protime, and platelet count. Also, moniter mental status closely as well. If these functions worsen over next few days, then would recommend transfer to [facility] hepatology division. Will follow. Thanks.    Electronic Signatures: Verdie Shire (MD) (Signed on 21-Sep-13 10:20)  Authored   Last Updated: 22-Sep-13 10:25 by Verdie Shire (MD)

## 2014-09-23 NOTE — Consult Note (Signed)
Brief Consult Note: Patient was seen by consultant.   Consult note dictated.   Discussed with Attending MD.   Comments: 1.cytopenia, may be chronic vs acute, immmune vs marrow failure. claims to have had abnl blood counts before  2 . transaminits, prior hx of abnl lfts , ? prior bx  3. chronic steriods but off x 3 mo. some of recent wt loss and GI sx may have been addisons  4 hx SLE,current neg ANTIDNA, she is unsure of prior meds but off plaquenil and immunosuppression for some time         rec 1 labs sent, boost prednisone to 60 mg qd and watch her counts, agree with transfer a fup with [facility], if stays here and counts stay low, rec heme opinion.  Electronic Signatures: Leeanne Mannan., Eugene Gavia (MD)  (Signed 23-Sep-13 13:53)  Authored: Brief Consult Note   Last Updated: 23-Sep-13 13:53 by Leeanne Mannan., Eugene Gavia (MD)

## 2014-09-23 NOTE — Consult Note (Signed)
Chief Complaint:   Subjective/Chief Complaint Overall better. Low dose prednisone started yest for poss lupus flare. LFT improving.   VITAL SIGNS/ANCILLARY NOTES: **Vital Signs.:   23-Sep-13 05:32   Vital Signs Type Routine   Temperature Temperature (F) 98   Celsius 36.6   Temperature Source Oral   Pulse Pulse 77   Respirations Respirations 20   Systolic BP Systolic BP 579   Diastolic BP (mmHg) Diastolic BP (mmHg) 94   Mean BP 110   Pulse Ox % Pulse Ox % 97   Pulse Ox Activity Level  At rest   Oxygen Delivery Room Air/ 21 %   Brief Assessment:   Cardiac Regular    Respiratory clear BS    Gastrointestinal RUQ area less tender   Lab Results: Hepatic:  23-Sep-13 04:51    Bilirubin, Total 0.4   Alkaline Phosphatase 107   SGPT (ALT)  233   SGOT (AST)  204   Total Protein, Serum  5.7   Albumin, Serum  2.1  Routine Chem:  23-Sep-13 04:51    Result Comment wbc - CRITICAL VALUE PREVIOUSLY NOTIFIED. wbc - RESULTS VERIFIED BY REPEAT TESTING. differential - DUE TO THE LOW WBC, THE INSTRUMENT DIFF  - CANNOT BE CONFIRMED BY MANUAL DIFF AND  - IS REPORTED PRIMARILY TO IDENTIFY CELL  - TYPES PRESENT.  Result(s) reported on 27 Feb 2012 at 06:16AM.   Glucose, Serum  102   BUN  2   Creatinine (comp)  0.56   Sodium, Serum 145   Potassium, Serum 3.8   Chloride, Serum  113   CO2, Serum 23   Calcium (Total), Serum  7.7   Osmolality (calc) 285   eGFR (African American) >60   eGFR (Non-African American) >60 (eGFR values <40m/min/1.73 m2 may be an indication of chronic kidney disease (CKD). Calculated eGFR is useful in patients with stable renal function. The eGFR calculation will not be reliable in acutely ill patients when serum creatinine is changing rapidly. It is not useful in  patients on dialysis. The eGFR calculation may not be applicable to patients at the low and high extremes of body sizes, pregnant women, and vegetarians.)   Anion Gap 9   Magnesium, Serum  1.7  (1.8-2.4 THERAPEUTIC RANGE: 4-7 mg/dL TOXIC: > 10 mg/dL  -----------------------)  Routine Coag:  23-Sep-13 04:51    Prothrombin 13.1   INR 1.0 (INR reference interval applies to patients on anticoagulant therapy. A single INR therapeutic range for coumarins is not optimal for all indications; however, the suggested range for most indications is 2.0 - 3.0. Exceptions to the INR Reference Range may include: Prosthetic heart valves, acute myocardial infarction, prevention of myocardial infarction, and combinations of aspirin and anticoagulant. The need for a higher or lower target INR must be assessed individually. Reference: The Pharmacology and Management of the Vitamin K  antagonists: the seventh ACCP Conference on Antithrombotic and Thrombolytic Therapy. CUXYBF.3832Sept:126 (3suppl): [id number] A HCT value >55% may artifactually increase the PT.  In one study,  the increase was an average of 25%. Reference:  "Effect on Routine and Special Coagulation Testing Values of Citrate Anticoagulant Adjustment in Patients with High HCT Values." American Journal of Clinical Pathology 2006;126:400-405.)  Routine Hem:  23-Sep-13 04:51    WBC (CBC)  1.4   RBC (CBC)  2.91   Hemoglobin (CBC)  9.6   Hematocrit (CBC)  28.7   Platelet Count (CBC)  88   MCV 99   MCH 33.0   MCHC 33.4  RDW  15.2   Neutrophil % 69.4   Lymphocyte % 19.5   Monocyte % 10.9   Eosinophil % 0.0   Basophil % 0.2   Neutrophil #  1.0   Lymphocyte #  0.3   Monocyte # 0.2   Eosinophil # 0.0   Basophil # 0.0   Assessment/Plan:  Assessment/Plan:   Assessment LFT elevation. improving. On prednisone for lupus.    Plan Rheum consult pending. Awaiting transfer to [facility]. I will be out at [facility] tomorrow but will check on Wed if patient still here. Thanks.   Electronic Signatures: Verdie Shire (MD)  (Signed 23-Sep-13 13:54)  Authored: Chief Complaint, VITAL SIGNS/ANCILLARY NOTES, Brief Assessment, Lab Results,  Assessment/Plan   Last Updated: 23-Sep-13 13:54 by Verdie Shire (MD)

## 2014-09-23 NOTE — Consult Note (Signed)
Chief Complaint:   Subjective/Chief Complaint Feels better. LFT finally improving. Lupus flare?   VITAL SIGNS/ANCILLARY NOTES: **Vital Signs.:   22-Sep-13 04:50   Vital Signs Type Routine   Temperature Temperature (F) 97.6   Celsius 36.4   Temperature Source Oral   Pulse Pulse 74   Respirations Respirations 18   Systolic BP Systolic BP 470   Diastolic BP (mmHg) Diastolic BP (mmHg) 80   Mean BP 97   Pulse Ox % Pulse Ox % 96   Pulse Ox Activity Level  At rest   Oxygen Delivery Room Air/ 21 %   Brief Assessment:   Cardiac Regular    Respiratory clear BS    Gastrointestinal upper abd tenderness   Lab Results: Hepatic:  22-Sep-13 02:49    Bilirubin, Total 0.7   Bilirubin, Direct  0.3 (Result(s) reported on 26 Feb 2012 at 07:59AM.)   Alkaline Phosphatase 64   SGPT (ALT)  334   SGOT (AST)  544   Total Protein, Serum  5.5   Albumin, Serum  2.0  General Ref:  22-Sep-13 02:49    Acetaminophen, Serum  5 (10-30 POTENTIALLY TOXIC:  > 200 mcg/mL  > 50 mcg/mL at 12 hr after  ingestion  > 300 mcg/mL at 4 hr after  ingestion)  Routine Chem:  22-Sep-13 02:49    Result Comment wbc - RESULTS VERIFIED BY REPEAT TESTING.  - CRITICAL VALUE PREVIOUSLY NOTIFIED.  Result(s) reported on 26 Feb 2012 at 07:59AM.   Potassium, Serum  2.9 (Result(s) reported on 26 Feb 2012 at 04:00AM.)  Routine Coag:  22-Sep-13 02:49    Prothrombin  15.4   INR 1.2 (INR reference interval applies to patients on anticoagulant therapy. A single INR therapeutic range for coumarins is not optimal for all indications; however, the suggested range for most indications is 2.0 - 3.0. Exceptions to the INR Reference Range may include: Prosthetic heart valves, acute myocardial infarction, prevention of myocardial infarction, and combinations of aspirin and anticoagulant. The need for a higher or lower target INR must be assessed individually. Reference: The Pharmacology and Management of the Vitamin K   antagonists: the seventh ACCP Conference on Antithrombotic and Thrombolytic Therapy. JGGEZ.6629 Sept:126 (3suppl): [id number]. A HCT value >55% may artifactually increase the PT.  In one study,  the increase was an average of 25%. Reference:  "Effect on Routine and Special Coagulation Testing Values of Citrate Anticoagulant Adjustment in Patients with High HCT Values." American Journal of Clinical Pathology 2006;126:400-405.)  Routine Hem:  22-Sep-13 02:49    WBC (CBC)  1.0   RBC (CBC)  2.97   Hemoglobin (CBC)  10.0   Hematocrit (CBC)  29.1   Platelet Count (CBC)  99   MCV 98   MCH 33.6   MCHC 34.3   RDW  14.8   Manual Diff MANUAL DIFF DONE  Result(s) reported on 26 Feb 2012 at 07:59AM.   Assessment/Plan:  Assessment/Plan:   Assessment LFT elevation- tylenol overdose, lupus flare, etc.?    Plan To be transferred to [facility] soon. Agree with low dose prednisone. Thanks   Electronic Signatures: Verdie Shire (MD)  (Signed 22-Sep-13 10:24)  Authored: Chief Complaint, VITAL SIGNS/ANCILLARY NOTES, Brief Assessment, Lab Results, Assessment/Plan   Last Updated: 22-Sep-13 10:24 by Verdie Shire (MD)

## 2014-09-23 NOTE — Consult Note (Signed)
PATIENT NAME:  Ana [name], Ana [name] MR#:  [id number] DATE OF BIRTH:  Mar 06, 1966  DATE OF CONSULTATION:  02/25/2012  REFERRING PHYSICIAN:   CONSULTING PHYSICIAN:  Lupita Dawn. Cliffard Hair, MD  ADDENDUM: Also in terms of recommendations, I agree with getting blood cultures and stool cultures. I agree with broad-spectrum antibiotics in case she has evidence of sepsis affecting her liver enzymes as well. The patient has already been started on some antibiotics.  ____________________________ Lupita Dawn. Ana Cruise, MD pyo:slb D: 02/26/2012 08:31:16 ET T: 02/26/2012 12:35:10 ET JOB#: [id number]  cc: Lupita Dawn. Ana Cruise, MD, <Dictator> Lupita Dawn Aryaa Bunting MD ELECTRONICALLY SIGNED 02/27/2012 11:02

## 2014-09-23 NOTE — H&P (Signed)
PATIENT NAME:  Ana [name], Ana [name] MR#:  [id number] DATE OF BIRTH:  21-Sep-1965  DATE OF ADMISSION:  03/13/2012  PRIMARY DOCTOR: None local.  ER PHYSICIAN: Dr. Lenise Arena    CHIEF COMPLAINT: Abdominal pain.   HISTORY OF PRESENT ILLNESS: The patient is a 49 year old female who was recently discharged from the hospital on September 25th with Tylenol overdose with elevated LFTs and abdominal pain who came here again because of abdominal pain, nausea, and vomiting. The patient told me that after the discharge on September 25th abdominal pain really didn't get better and she went to [facility] after two days and was in the ER for a day and discharged home with oxycodone. The patient took the oxycodone without much relief in the abdominal pain. Abdominal pain is mainly epigastric and right upper quadrant pain radiating to the back associated with some nausea and decreased p.o. intake. Abdominal pain is radiating down to the right groin and left groin. No relieving factors. Is aggravated by food intake. The patient also complains of loose stools, has no blood in the stool. Nausea and vomiting mainly whenever she eats and unable to keep anything down. The patient also complained of fever last night of 101.   PAST MEDICAL HISTORY:  1. Lupus.  2. Fibromyalgia.  3. Hypertension.  4. Depression.   5. Obesity.   SOCIAL HISTORY: No smoking. No drinking. No drugs. Lives with son.   FAMILY HISTORY: Mother had diabetes. No history of hypertension or strokes in the family.   PAST SURGICAL HISTORY: Cesarean section.   HOME MEDICATIONS: None. The patient used to take Tylenol Extra Strength 500 mg tablets 4 tablets every six hours p.r.n. but she stopped since last discharge.   REVIEW OF SYSTEMS: The patient complains of some fatigue, abdominal pain, unable to keep anything down. EYES: No blurred vision. ENT: No tinnitus. No epistaxis. No difficulty swallowing. RESPIRATORY: No cough. CARDIOVASCULAR: No  chest pain. GI: Has nausea, vomiting, abdominal pain. GU: No dysuria. ENDOCRINE: No polyuria or nocturia. HEMATOLOGIC: No anemia. MUSCULOSKELETAL: The patient had history of lupus and joint pains. NEUROLOGIC: No numbness or weakness. PSYCH: Has depression.   PHYSICAL EXAMINATION:   VITAL SIGNS: Temperature 98.8, pulse 92, respirations 20, blood pressure 159/120.  GENERAL: The patient is a well nourished female who is not in distress, answering questions appropriately.   HEENT: Head atraumatic, normocephalic. Pupils equal, reacting to light. Extraocular movements intact.   ENT: No tympanic membrane congestion. No turbinate hypertrophy. No oropharyngeal erythema.   NECK: Normal range of motion. No JVD. No carotid bruits.   CARDIOVASCULAR: S1, S2 regular. No murmurs.   LUNGS: Clear to auscultation. No wheeze. No rales.   ABDOMEN: The patient had midepigastric tenderness and right upper quadrant tenderness. Bowel sounds are present. No hernias. Midline incision present. The patient had Cesarean section before.   EXTREMITIES: No extremity edema. No cyanosis. No clubbing.   NEUROLOGICAL: The patient is alert, awake, oriented. Cranial nerves II through XII intact. Power 5/5 in upper and lower extremities. Sensation intact. Deep tendon reflexes 2+ bilaterally.   SKIN: No skin rashes.  LABORATORY, DIAGNOSTIC, AND RADIOLOGICAL DATA: CAT scan of the abdomen and pelvis showed midline fluid collection in the prerectal area. No air-fluid levels. No thickened wall is seen to suggest abscess. Pelvic ultrasound may be beneficial for clarification. Previous hysterectomy. Normal-appearing appendix. Small fat filled umbilical hernia. Cholelithiasis. Lung bases are clear. The patient has small stones present in the dependent portion of gallbladder. Liver, spleen, pancreas,  and adrenals are normal. No adenopathy. No pneumoperitoneum.   Electrolytes sodium 143, potassium 4.1, chloride 109, bicarb 24, BUN 4,  creatinine 0.83, glucose 83. LFTs within normal limits except AST slightly elevated at 46. WBC 3.3, hemoglobin 12.5, hematocrit 36.3, platelets 202. Lipase 695. Tylenol level 18. Salicylates less than 1.7.   ASSESSMENT AND PLAN:  1. The patient is a 49 year old female with abdominal pain, slightly elevated lipase, has evidence of mild acute pancreatitis. Even the CAT scan is normal. She has symptoms of nausea, abdominal pain going to the back along with unable to take anything by mouth. We are going to keep her on observation for overnight to give her some time for bowel rest and continue n.p.o. along with fluids, IV pain medicines, IV nausea medications, and see how she does. The patient has history of Tylenol toxicity with abnormal LFTs and was admitted in September but LFTs are much better this time. She is not abusing Tylenol anymore.  2. History of fibromyalgia. The patient is not on any medicines.  3. History of SLE. The patient is trying to get a dermatologist appointment. She is not on any supplements at this time. The patient was seen by Dr. Cristi Loron during the last admission. She also had abdominal ultrasound which showed fatty liver. The patient does have gallstones but no CBD dilatation.  4. Hypertension. Blood pressure is elevated here probably due to her pain but we're going to start her on beta-blockers. 5. History of Tylenol toxicity. She was on NAC drip last time. 6. History of lupus. The patient was seen by Dr. Jefm Bryant and she had extensive work-up with complement levels, anti-ds DNA, haptoglobin level and they were all negative except the ANA. The patient was empirically started on prednisone.   TIME SPENT: About 55 minutes on the history and physical. Counseled the patient again on overusing the Tylenol.   ____________________________ Epifanio Lesches, MD sk:drc D: 03/13/2012 16:23:19 ET T: 03/13/2012 17:05:47 ET JOB#: [id number] Epifanio Lesches MD ELECTRONICALLY  SIGNED 04/07/2012 22:44

## 2014-09-23 NOTE — Discharge Summary (Signed)
PATIENT NAME:  Ana Ana [name], Ana Ana [name] MR#:  [id number] DATE OF BIRTH:  February 05, 1966  DATE OF ADMISSION:  02/24/2012 DATE OF DISCHARGE:  02/29/2012  DIAGNOSES:  1. Possible Tylenol overdose due to chronic ingestion of  high doses of Tylenol. 2. Elevated liver function tests possibly due to Tylenol toxicity. 3. Abdominal pain, nausea and vomiting, resolved.  4. Possible urinary tract infection.  5. Lupus. 6. Hypokalemia.  7. Hypomagnesemia.  8. Chronic nausea, vomiting, diarrhea. 9. Fibromyalgia. 10. Depression.   DISPOSITION: The patient is being discharged home. She has been advised to follow-up with the physicians at [facility]   DIET: Regular.   ACTIVITY: As tolerated.   DISCHARGE MEDICATIONS:  Prednisone taper. At this end of this, she will continue prednisone 10 mg daily. Tramadol 50 mg every six hours p.r.n., oxycodone 5 mg every six hours p.r.n. Phenergan 25 mg every six hours p.r.n.   CONSULTANTS:  Hematology consultation with Dr. Jefm Bryant. GI consultation with Dr Candace Cruise.   LABORATORY, DIAGNOSTIC AND RADIOLOGICAL DATA: Abdominal ultrasound showed multiple gallstones and sludge, no evidence of cholecystitis. No CBD or intrahepatic ductal dilatation. Ana Ana [name], B and [name] were negative. Tylenol level was 23 on admission. HIV negative, antidouble stranded antibody negative, complement CH50, normal; complement C3 normal; complement C4 norma,  ESR elevated, ENA positive, antimitochondrial antibody negative. Anti-smooth muscle antibody negative. Stool for ova and parasite, none seen. Haptoglobin normal 152, protein electrophoresis showed no M spike. Vitamin B12 was normal. The patient's white count fluctuated from 1.0 to 7,  hemoglobin and 13.2 to 10.4, platelet count 88 to 190, initially the patient's AST was more than 2000. It was 51 by the time of discharge ALT was as high as 701. Normal by the time of discharge. Normal bilirubin, normal ALP. LDH elevated 313,  normal lipase, low potassium 2.9,  which was supplemented, low magnesium 1.7, which was supplemented. Normal renal function and electrolytes normal.   HOSPITAL COURSE: The patient is Ana Ana [name] female with past medical history of fibromyalgia, lupus, depression, hypertension,  chronic nausea, vomiting, and diarrhea who presented with fever, and abdominal pain. The patient normally is to follow-up at [facility] but stopped 2 to 3 months ago, she was not taking any medications for her lupus or fibromyalgia. It appears she may  have been on and some immunosuppressants in the past.   She started self-medicating herself with more than 6 grams of Tylenol for the last 2 to 3 months and presented with severely elevated liver function tests. Although her Tylenol level was normal, given her chronic overuse of Tylenol and elevated liver function tests, she was treated with NAC drip. Her Tylenol level was 23 and when it feels less than  five, her NAC drip was discontinued as per Poison Control's recommendation. The patient has been advised to read the labels of al the over the counter medications and do not take more than the prescribed amounts. She has been advised to withhold taking Tylenol. She had very elevated liver function tests which are possibly due to chronic Tylenol overdose or an overuse GI consultation was obtained. Ana panels were checked which were negative. Anti-smooth muscle antibody and antimitochondrial antibodies were also checked and were negative. With conservative management, the patient's LFTs started improving, her abdominal ultrasound showed some gallstones and sludge and fatty liver but no evidence of any cholecystitis.   When the patient initially presented she spiked Ana [name] fever of 103.1. Blood and cultures were negative. Urine culture showed around 2000 colonies  of gram-negative rods. The patient was empirically treated with Flagyl and Cipro and remained afebrile throughout thereafter. The patient was felt to have Ana [name]  deep lupus flare because she had fever and subsequently developed pancytopenia. Extensive work-up including complement levels, anti-double-stranded, CH50, haptoglobulin were all negative but ANA was positive and Ana [name] hematology consultation with Dr. Jefm Bryant was obtained and patient was empirically started on oral prednisone with improvement in her pancytopenia. Dr Jefm Bryant recommended tapering the dose of prednisone to 10 mg daily for the patient to follow at [facility]. The patient had chronic nausea, vomiting, and diarrhea. Her stool studies and [name]. difficile were negative. She had low potassium and magnesium which was supplemented. The patient was aggressively counseled about  avoiding overdose of over-the-counter medications and routine follow-up at [facility]  especially for her lupus and fibromyalgia. The patient specifically requested for Ana [name] prescription for narcotic analgesics for her fibromyalgia. She was discharged home in Ana [name] stable condition.   TIME SPENT: 45 minutes.    ____________________________ Cherre Huger, MD sp:ljs D: 02/29/2012 16:06:28 ET T: 03/01/2012 13:09:24 ET JOB#: [id number]  cc: Cherre Huger, MD, <Dictator> Cherre Huger MD ELECTRONICALLY SIGNED 03/01/2012 16:39

## 2014-09-27 NOTE — Discharge Summary (Signed)
PATIENT NAME:  Ana [name], Ana [name] A MR#:  [id number] DATE OF BIRTH:  [birth year]  DATE OF ADMISSION:  05/04/2014 DATE OF DISCHARGE:  05/08/2014   ADMITTING DIAGNOSIS:  Trimalleolar right ankle fracture.   DISCHARGE DIAGNOSIS:   Trimalleolar right ankle fracture.   ADMITTING PHYSICIAN:   Peterson Lombard, MD.  ATTENDING:  Laurice Record. Holley Bouche., MD.   HISTORY: The patient is a 49 year old African American female who fell out of bed on the day of admission injuring the right ankle. She was unable to ambulate. She presented to the Emergency Room where x-rays were obtained that showed a right ankle fracture with subluxation that was treated with a closed reduction and application of a posterior splint. There was no other injury recorded. The patient was admitted to the hospital for ORIF.   HOSPITAL COURSE, PROCEDURE: Open reduction and internal fixation of a right trimalleolar  ankle fracture.   ANESTHESIA: General.   IMPLANTS UTILIZED: Synthes 7-hole one-third tubular plate, seven 3.5 mm cortical screws, two 4.0 mm cannulated partially-threaded cancellous screw.   HOSPITAL COURSE: The patient tolerated the procedure very well. She had no complications. She was then taken to the PACU where she was stabilized and then transferred to the orthopedic floor. The patient began receiving anticoagulation therapy of Lovenox 30 mg subcutaneous every 12 hours from the day of admission. She was fitted with TED stockings on the nonoperative leg. Also fitted with PolarCare to the injured ankle. She was fitted with the AV-I compression foot pumps to the nonsurgical leg as well. The right lower extremity was elevated on 2 pillows. The patient's vital signs have been stable. She has been afebrile. Hemodynamically, she was stable. No transfusions were given. The patient's biggest complaint was pain issues. This, according to patient was secondary to her fibromyalgia. Prior to admission, she was taking  Percocet and this hindered  the pain control issues. The patient continues to complain of pain throughout the hospital course, even though she was sleeping. She rated her pain at a 10/10 at all times.   DISPOSITION: The patient is being discharged to home in improved stable condition. She has been nonweightbearing to the surgical leg. Elevate the right lower extremity. Continue using PolarCare around-the-clock until seen in the office. Elevate the heels off the bed. Recommend that she continue using her incentive spirometer q. 1 hour while awake. Encourage cough, deep breathing q. 2 hours while awake. She was placed on a regular diet. She will need to call the clinic for appointment in 7-10 days. Call the clinic sooner if any temperatures of 101.5 or excessive bleeding. The patient may resume her regular medication that she was on prior to admission. She was given 4 new prescriptions.  This consisted of Percocet 5-10 mg q. 4-6 hours p.r.n. for pain, OxyContin 10 mg 1 tablet q. 12 hours for 10 days. No refill will be given for this. Tramadol 50-100 mg q. 4-6 hours p.r.n. for pain and Lovenox 40 mg subcutaneously daily for 14 days, then discontinue and begin taking 181 mg enteric-coated aspirin.   PAST MEDICAL HISTORY:  Fibromyalgia, depression, asthma, Graham-Little syndrome, PTSD, hypertension, lupus.   ____________________________ Vance Peper, PA jrw:DT D: 05/08/2014 07:45:19 ET T: 05/08/2014 12:58:44 ET JOB#: [id number]  cc: Vance Peper, PA, <Dictator> Aerilynn Goin PA ELECTRONICALLY SIGNED 05/20/2014 7:59

## 2014-09-27 NOTE — H&P (Signed)
Subjective/Chief Complaint right ankle pain   History of Present Illness 45 F s/p fall out of bed with right ankle pain and deformity. No prior fractures, no prior orthopaedic surgery. She presented to the ED with family and was diagnosed with an ankle fracture/subluxation that was treated with closed reduction and splinting. She denies head injury, LOC, presyncope, chest pain.   Past History HTN lupus Fibromyalgia   Code Status Full Code   Past Med/Surgical Hx:  depression:   asthma:   Graham Little syndrome:   PTSD:   HTN:   fibramyalgia:   lupus:   Cesarean Section:   Hysterectomy - Partial:   Breast Surgery - left:   ALLERGIES:  No Known Allergies:    Medications plaquenel 200 mg BID Trazedone 300 mg QHS Promthazine 25 mg q6h prn nausea HCTZ 25 mg daily Topiramate 75 mg TID Omeprazole 40 mg Daily Baclofen 10 mg qid prn citalopram 40 mg daily fluticasone nasal spray prn   Family and Social History:  Family History Non-Contributory   Place of Living Home   Review of Systems:  Subjective/Chief Complaint right ankle pain   Fever/Chills No   Cough No   Sputum No   Abdominal Pain No   Diarrhea No   Constipation No   Nausea/Vomiting No   SOB/DOE No   Chest Pain No   Dysuria No   Tolerating Diet Yes   Medications/Allergies Reviewed Medications/Allergies reviewed   Physical Exam:  GEN no acute distress   HEENT PERRL, hearing intact to voice, moist oral mucosa   NECK supple  No masses  trachea midline   RESP normal resp effort  clear BS  no use of accessory muscles   CARD regular rate   ABD denies tenderness  soft   GU no superpubic tenderness   EXTR negative cyanosis/clubbing   SKIN normal to palpation   NEURO follows commands   PSYCH A+O to time, place, person   Additional Comments right lower extremity in short leg splint Ankle swelling present without visible blistering Able to dorsiflex and plantarflex great  toe Sensation intact in DPN/SPN/T nerve distributions dorsalis pedis pulse 2+ Brisk capillary refill   Lab Results: Routine Chem:  29-Nov-15 02:26   Glucose, Serum 98  BUN 13  Creatinine (comp) 0.90  Sodium, Serum 140  Potassium, Serum 4.5  Chloride, Serum  109  CO2, Serum 23  Calcium (Total), Serum  8.4  Anion Gap 8  Osmolality (calc) 279  eGFR (African American) >60  eGFR (Non-African American) >60 (eGFR values <4m/min/1.73 m2 may be an indication of chronic kidney disease (CKD). Calculated eGFR, using the MRDR Study equation, is useful in  patients with stable renal function. The eGFR calculation will not be reliable in acutely ill patients when serum creatinine is changing rapidly. It is not useful in patients on dialysis. The eGFR calculation may not be applicable to patients at the low and high extremes of body sizes, pregnant women, and vegetarians.)  Result Comment POTASSIUM/CREATININE/BUN - Slight hemolysis, interpret results with  - caution.  Result(s) reported on 04 May 2014 at 02:50AM.  Ethanol, S. < 3 (Result(s) reported on 04 May 2014 at 02:50AM.)  Routine Coag:  29-Nov-15 02:26   Prothrombin 12.2  INR 0.9 (INR reference interval applies to patients on anticoagulant therapy. A single INR therapeutic range for coumarins is not optimal for all indications; however, the suggested range for most indications is 2.0 - 3.0. Exceptions to the INR Reference Range may include: Prosthetic  heart valves, acute myocardial infarction, prevention of myocardial infarction, and combinations of aspirin and anticoagulant. The need for a higher or lower target INR must be assessed individually. Reference: The Pharmacology and Management of the Vitamin K  antagonists: the seventh ACCP Conference on Antithrombotic and Thrombolytic Therapy. NTIRW.4315 Sept:126 (3suppl): [id number]. A HCT value >55% may artifactually increase the PT.  In one study,  the increase was an average of  25%. Reference:  "Effect on Routine and Special Coagulation Testing Values of Citrate Anticoagulant Adjustment in Patients with High HCT Values." American Journal of Clinical Pathology 2006;126:400-405.)  Routine Hem:  29-Nov-15 02:26   WBC (CBC) 6.1  RBC (CBC)  3.72  Hemoglobin (CBC)  11.2  Hematocrit (CBC)  33.9  Platelet Count (CBC) 222 (Result(s) reported on 04 May 2014 at 02:40AM.)  MCV 91  MCH 30.1  MCHC 33.0  RDW 12.3   Radiology Results: XRay:    29-Nov-15 01:40, Ankle Right AP and Lateral  Ankle Right AP and Lateral  REASON FOR EXAM:    PAIN, DEFORMITY  COMMENTS:   Bedside (portable):Y    PROCEDURE: DXR - DXR ANKLE RIGHT AP AND LATERAL  - May 04 2014  1:40AM     CLINICAL DATA:  Injury while getting out of bed, trip and fall on  sheets.    EXAM:  RIGHT ANKLE - 2 VIEW    COMPARISON:  None.    FINDINGS:  Displaced distal fibular fracture lateral angulation of distal bony  fragments. Displaced medial malleolus fracture with lateral  displacement distal bony fragment. Possible nondisplaced posterior  malleolus fracture. Widened tibiotalar syndesmosis. Disrupted ankle  mortise, lateral displacement of the talar dome. No destructive bony  lesions. Mild soft tissue swelling without subcutaneous gas or  radiopaque foreign bodies.     IMPRESSION:  Displaced distal fibular fracture. Displaced medial malleolus  fracture. Possible nondisplaced posterior medial malleolus fracture.  Ankle subluxation with syndesmotic injury.      Electronically Signed    By: Elon Alas    On: 05/04/2014 02:04         VerifiedBy: Ricky Ala, M.D.,    29-Nov-15 03:08, Ankle Right AP and Lateral  Ankle Right AP and Lateral  REASON FOR EXAM:    POST-REDUCTION  COMMENTS:   Bedside (portable):Y    PROCEDURE: DXR - DXR ANKLE RIGHT AP AND LATERAL  - May 04 2014  3:08AM     CLINICAL DATA:  Postreduction, known ankle fracture.    EXAM:  RIGHT ANKLE - 2  VIEW    COMPARISON:  Ankle radiograph May 04, 2014 at 132 a.m.    FINDINGS:  Distal fibular comminuted fracture in alignment. Medial malleolus  fracture malalignment. Probable posterior malleolus fracture in  alignment. No dislocation. Interval placement fiberglass cast  obscures the fine bony detail.     IMPRESSION:  Improved alignment of ankle fractures without dislocation, in  fiberglass cast.      Electronically Signed    By: Elon Alas    On: 05/04/2014 03:20         Verified By: Ricky Ala, M.D.,    29-Nov-15 03:08, Chest Portable Single View  Chest Portable Single View  REASON FOR EXAM:    PRE-OP FOR ANKLE FRACTURE  COMMENTS:       PROCEDURE: DXR - DXR PORTABLE CHEST SINGLE VIEW  - May 04 2014  3:08AM     CLINICAL DATA:  Preoperative evaluation for ankle fracture.    EXAM:  PORTABLE  CHEST - 1 VIEW    COMPARISON:  None.    FINDINGS:  Cardiomediastinal silhouette is unremarkable. The lungs are clear  without pleural effusions or focal consolidations. Trachea projects  midline and there is no pneumothorax. Soft tissue planes and  included osseous structures are non-suspicious. Surgical clips in  LEFT breast.     IMPRESSION:  No acute cardiopulmonary process ; normal chest radiograph.      Electronically Signed    By: Elon Alas    On: 05/04/2014 03:17         Verified By: Ricky Ala, M.D.,    29-Nov-15 05:35, Ankle Right AP and Lateral  Ankle Right AP and Lateral  REASON FOR EXAM:    MORTISE VIEW  COMMENTS:   Bedside (portable):Y    PROCEDURE: DXR - DXR ANKLE RIGHT AP AND LATERAL  - May 04 2014  5:35AM     CLINICAL DATA:  Request further evaluation of known ankle fractures,  status post cast placement. Ankle mortise view requested. Follow-up  study.    EXAM:  RIGHT ANKLE - 2 VIEW    COMPARISON:  Right ankle radiographs performed earlier today at 2:59  a.m.  FINDINGS:  There is mild residual displacement of  the patient's medial  malleolar, posterior malleolar and distal fibular diaphyseal  fracture, with dorsal displacement of the slightly comminuted distal  fibular oblique fracture. The ankle mortise demonstrates grossly  normal width. No new fractures are seen.    Evaluation of the soft tissues is limited given the overlying cast.     IMPRESSION:  Mild residual displacement of the medial malleolar, posterior  malleolar and distal fibular diaphyseal fracture. The distal fibular  fracture is mildly comminuted, with approximately 7 mm of posterior  displacement.  Electronically Signed    By: Garald Balding M.D.    On: 05/04/2014 05:44         Verified By: JEFFREY . Radene Knee, M.D.,  [place]:    29-Nov-15 01:40, Ankle Right AP and Lateral  PACS Image    29-Nov-15 03:08, Ankle Right AP and Lateral  PACS Image    29-Nov-15 03:08, Chest Portable Single View  PACS Image    29-Nov-15 05:35, Ankle Right AP and Lateral  PACS Image    Assessment/Admission Diagnosis 53 F with right ankle fracture subluxation to be admitted for elevation, pain control and ORIF if swelling allows   Plan NPO/IVF, if swelling not significant will consider for immediate ORIF I discussed the risks, benefits and alternatives to surgical management of her injury, informed consent was obtained and witnessed Pain control Ice, elevation crutch training with PT Home medications resumed   Electronic Signatures: Peterson Lombard (MD)  (Signed [phone] 06:06)  Authored: CHIEF COMPLAINT and HISTORY, PAST MEDICAL/SURGIAL HISTORY, ALLERGIES, OTHER MEDICATIONS, FAMILY AND SOCIAL HISTORY, REVIEW OF SYSTEMS, PHYSICAL EXAM, LABS, Radiology, ASSESSMENT AND PLAN   Last Updated: 29-Nov-15 06:06 by Peterson Lombard (MD)

## 2014-09-27 NOTE — Op Note (Signed)
PATIENT NAME:  Ana [name], Ana [name] A MR#:  [id number] DATE OF BIRTH:  Nov 14, 1965  DATE OF PROCEDURE:  05/07/2014  PREOPERATIVE DIAGNOSIS: Right trimalleolar ankle fracture.   POSTOPERATIVE DIAGNOSIS: Right trimalleolar ankle fracture.   PROCEDURE PERFORMED: Open reduction and internal fixation of the medial and lateral malleolar fractures.   SURGEON: Skip Estimable, MD   ANESTHESIA: General.   ESTIMATED BLOOD LOSS: Minimal.   FLUIDS REPLACED: 700 mL of crystalloid.   TOURNIQUET TIME: 98 minutes.   DRAINS: None.   IMPLANTS UTILIZED: Synthes 7 hole one-third tubular plate, seven 3.5 mm cortical screws, and two 4.0 mm cannulated partially threaded cancellus screws.   INDICATIONS FOR SURGERY: The patient is a 49 year old female who fell and twisted her right ankle on in 05/04/2014. X-rays demonstrated a trimalleolar ankle fracture. Preliminary reduction was performed in the Emergency Department. However, it was felt that the swelling was too extensive to perform immediate fixation. The patient was admitted for pain control as well as use of ice and elevation to control swelling. After discussion of the risks and benefits of surgical intervention, the patient expressed understanding of the risks and benefits, and agreed with plans for surgical intervention.   PROCEDURE IN DETAIL: The patient was brought to the operating room and, after adequate general anesthesia was achieved, a tourniquet was placed on the patient's upper right thigh. The patient's right foot and ankle were cleaned and prepped with alcohol and DuraPrep and draped in the usual sterile fashion. A "timeout" was performed as per usual protocol. The right lower extremity was exsanguinated using an Esmarch, and the tourniquet was inflated to 300 mmHg. A lateral longitudinal incision was made in line with the distal fibula. Dissection was carried down to the fracture site along the distal fibula. Soft tissue was debrided from the fracture site  and provisional reduction of the distal fibular fracture was performed and maintained using bone reduction forceps with points. A 7 hole one-third tubular plate was contoured so as to be placed along the lateral aspect of the distal fibula. The plate was then secured with seven 3.5 mm cortical screws. Good alignment and fixation was achieved in this way and was confirmed in both AP and lateral planes using the FluoroScan. The wound was irrigated with copious amounts of normal saline with antibiotic solution. The wound was closed in layers using first #0 Vicryl followed by 2-0 Vicryl. Attention was then directed to the medial malleolus. The distal fragment was visualized on the C-arm and a posteromedial incision was made. The fracture site was identified and cleared of soft tissue debris and hematoma. The distal fragment was reduced using bone reduction forceps with points. Two 1.25 mm distally threaded guidewires were inserted in a retrograde fashion so as to stabilize the medial malleolus. Good position was noted in both AP and lateral planes. Two 4.0 mm cannulated partially threaded cancellus screws were then advanced over the guidewires with good compression of the fracture site noted. Again, excellent reduction was appreciated with good restoration of the ankle mortise. A guidewire was removed. The wound was irrigated with copious amounts of normal saline with antibiotic solution. Wound was closed in layers using first #0 Vicryl followed by 2-0 Vicryl. Skin was closed to both the medial and lateral incisions using skin staples; 10 mL of 0.25% Marcaine was injected along the incision sites. A sterile dressing was applied followed by application of a posterior splint. Tourniquet had been deflated after a total tourniquet time of 98 minutes.   The patient  tolerated the procedure well. She was transported to the recovery room in stable condition.    ____________________________ Laurice Record. Holley Bouche.,  MD jph:bm D: 05/08/2014 06:38:00 ET T: 05/08/2014 07:40:26 ET JOB#: [id number]  cc: Laurice Record. Holley Bouche., MD, <Dictator> JAMES P Holley Bouche MD ELECTRONICALLY SIGNED 05/09/2014 12:55

## 2014-10-09 ENCOUNTER — Emergency Department
Admission: EM | Admit: 2014-10-09 | Discharge: 2014-10-09 | Disposition: A | Discharge status: Home / Self Care | Payer: Medicare Other | Attending: Emergency Medicine | Admitting: Emergency Medicine

## 2014-10-09 ENCOUNTER — Emergency Department: Payer: Medicare Other

## 2014-10-09 ENCOUNTER — Encounter: Payer: Self-pay | Admitting: General Practice

## 2014-10-09 DIAGNOSIS — R071 Chest pain on breathing: Secondary | ICD-10-CM

## 2014-10-09 DIAGNOSIS — R079 Chest pain, unspecified: Secondary | ICD-10-CM | POA: Diagnosis not present

## 2014-10-09 HISTORY — DX: Systemic lupus erythematosus, unspecified: M32.9

## 2014-10-09 LAB — BRAIN NATRIURETIC PEPTIDE
B NATRIURETIC PEPTIDE 5: UNDETERMINED pg/mL (ref 0.0–100.0)
B Natriuretic Peptide: 18 pg/mL (ref 0.0–100.0)

## 2014-10-09 LAB — COMPREHENSIVE METABOLIC PANEL
ALK PHOS: 77 U/L (ref 38–126)
ALT: 10 U/L — ABNORMAL LOW (ref 14–54)
AST: 30 U/L (ref 15–41)
Albumin: 4.4 g/dL (ref 3.5–5.0)
Anion gap: 8 (ref 5–15)
BILIRUBIN TOTAL: 1 mg/dL (ref 0.3–1.2)
BUN: 9 mg/dL (ref 6–20)
CHLORIDE: 104 mmol/L (ref 101–111)
CO2: 25 mmol/L (ref 22–32)
CREATININE: 0.86 mg/dL (ref 0.44–1.00)
Calcium: 8.7 mg/dL — ABNORMAL LOW (ref 8.9–10.3)
GFR calc Af Amer: >60 mL/min (ref >60)
GFR calc non Af Amer: >60 mL/min (ref >60)
Glucose, Bld: 85 mg/dL (ref 65–99)
Potassium: 4.8 mmol/L (ref 3.5–5.1)
Sodium: 137 mmol/L (ref 135–145)
Total Protein: 8.3 g/dL — ABNORMAL HIGH (ref 6.5–8.1)

## 2014-10-09 LAB — CBC
HEMATOCRIT: 35.2 % (ref 35.0–47.0)
HEMOGLOBIN: 11.7 g/dL — AB (ref 12.0–16.0)
MCH: 29.8 pg (ref 26.0–34.0)
MCHC: 33.2 g/dL (ref 32.0–36.0)
MCV: 89.9 fL (ref 80.0–100.0)
Platelets: 276 10*3/uL (ref 150–440)
RBC: 3.92 MIL/uL (ref 3.80–5.20)
RDW: 14.9 % — ABNORMAL HIGH (ref 11.5–14.5)
WBC: 4 10*3/uL (ref 3.6–11.0)

## 2014-10-09 LAB — TROPONIN I: Troponin I: <0.03 ng/mL (ref <0.031)

## 2014-10-09 LAB — FIBRIN DERIVATIVES D-DIMER (ARMC ONLY): Fibrin derivatives D-dimer (ARMC): 271

## 2014-10-09 MED ORDER — KETOROLAC TROMETHAMINE 30 MG/ML IJ SOLN
30.0000 mg | Freq: Once | INTRAMUSCULAR | Status: AC
  Administered 2014-10-09: 30 mg via INTRAVENOUS

## 2014-10-09 MED ORDER — KETOROLAC TROMETHAMINE 30 MG/ML IJ SOLN
INTRAMUSCULAR | Status: AC
  Filled 2014-10-09: qty 1

## 2014-10-09 NOTE — ED Provider Notes (Signed)
[facility] Emergency Department Provider Note ____________________________________________  Time seen:550  I have reviewed the triage vital signs and the nursing notes.   HISTORY  Chief Complaint Chest Pain       HPI Ana [name] is a 49 y.o. female history obtained from EMS and patient EMS reports that patient told them that she been having chest pain for about a week or 2 got worse in the last week and really severe to last day patient tells me that she's had chest pain for 2 days yesterday and today comes and goes sharp stabbing pain very brief worse if she moves or breathes radiates up into the neck and down into the arm all of its sharp and stabbing nauseating does not come on with exertion associated makes her short of breath and nauseated she said it is very severe in nature patient reports she fractured her right ankle in December and then tripped and fell some time ago and it popped again today she is still wearing an ankle boot. The pain is not heavy or tight. Again the pain does not come on with exertion and lasts only one or 2 seconds at a time possibly less but recurs frequently. When the pain occurs it is so bad it takes her breath away. On discharge patient feels better    Past Medical History  Diagnosis Date  . Lupus 1990    There are no active problems to display for this patient.   No past surgical history on file.  No current outpatient prescriptions on file.  Allergies Review of patient's allergies indicates no known allergies.  No family history on file.  Social History History  Substance Use Topics  . Smoking status: Never Smoker   . Smokeless tobacco: Not on file  . Alcohol Use: 1.2 oz/week    2 Cans of beer per week    Review of Systems  Constitutional: Negative for fever. Eyes: Negative for visual changes. ENT: Negative for sore throat. Gastrointestinal: Negative for abdominal pain, vomiting and  diarrhea. Genitourinary: Negative for dysuria. Musculoskeletal: Negative for back pain. Skin: Negative for rash. Neurological: Negative for headaches, focal weakness or numbness. Hematological/Lymphatic:  Review of systems is otherwise negative except as noted in history of present illness  ____________________________________________   PHYSICAL EXAM:  VITAL SIGNS: ED Triage Vitals  Enc Vitals Group     BP 10/09/14 1753 175/99 mmHg     Pulse Rate 10/09/14 1753 73     Resp 10/09/14 1753 18     Temp 10/09/14 1753 98.4 F (36.9 C)     Temp src --      SpO2 10/09/14 1753 100 %     Weight 10/09/14 1753 170 lb (77.111 kg)     Height 10/09/14 1753 5\' 4"  (1.626 m)     Head Cir --      Peak Flow --      Pain Score 10/09/14 1754 8     Pain Loc --      Pain Edu? --      Excl. in [place]? --      Constitutional: Alert and oriented. Well appearing and in no distress. Eyes: Conjunctivae are normal. PERRL. Normal extraocular movements. ENT   Head: Normocephalic and atraumatic.   Nose: No congestion/rhinnorhea.   Mouth/Throat: Mucous membranes are moist.   Neck: No stridor. Hematological/Lymphatic/Immunilogical: No cervical lymphadenopathy. Cardiovascular: Normal rate, regular rhythm. Normal and symmetric distal pulses are present in all extremities. No murmurs, rubs, or  gallops. Respiratory: Normal respiratory effort without tachypnea nor retractions. Breath sounds are clear and equal bilaterally. No wheezes/rales/rhonchi. Gastrointestinal: Soft and nontender. No distention. No abdominal bruits. There is no CVA tenderness. Musculoskeletal: Extremities are normal except for the right ankle which is in the ankle boot there is no edema noted above the boot Neurologic:  Normal speech and language. No gross focal neurologic deficits are appreciated. Speech is normal. No gait instability. Skin:  Skin is warm, dry and intact. No rash noted. Psychiatric: Mood and affect are normal.  Speech and behavior are normal. Patient exhibits appropriate insight and judgment.  ____________________________________________    LABS (pertinent positives/negatives)  Troponin and repeat troponin are negative D dimer is negative and no significant lab findings  ____________________________________________   EKG  EKG shows normal sinus rhythm rate of 60 normal axis and nonspecific ST-T wave changes in V4 through 6 not seeing any other significant changes in this EKG which otherwise looks good  ____________________________________________    RADIOLOGY  History x-rays read as negative  ____________________________________________   PROCEDURES  Procedure(s) performed: None  Critical Care performed: No  ____________________________________________   INITIAL IMPRESSION / ASSESSMENT AND PLAN / ED COURSE  Pertinent labs & imaging results that were available during my care of the patient were reviewed by me and considered in my medical decision making (see chart for details).    ____________________________________________   FINAL CLINICAL IMPRESSION(S) / ED DIAGNOSES  Final diagnoses:  Chest pain made worse by breathing    Nena Polio, MD 10/09/14 2145

## 2014-10-09 NOTE — Discharge Instructions (Signed)
Please follow up with your doctor or [facility] or the [facility] or the [facility] or the [facility] or [facility] in the next few days. Please return if worse. Take motrin up to 3 of the over the counter pills 3 x a day with food for the next 3-4 days for the pain.  Chest Pain (Nonspecific) It is often hard to give a specific diagnosis for the cause of chest pain. There is always a chance that your pain could be related to something serious, such as a heart attack or a blood clot in the lungs. You need to follow up with your health care provider for further evaluation. CAUSES   Heartburn.  Pneumonia or bronchitis.  Anxiety or stress.  Inflammation around your heart (pericarditis) or lung (pleuritis or pleurisy).  A blood clot in the lung.  A collapsed lung (pneumothorax). It can develop suddenly on its own (spontaneous pneumothorax) or from trauma to the chest.  Shingles infection (herpes zoster virus). The chest wall is composed of bones, muscles, and cartilage. Any of these can be the source of the pain.  The bones can be bruised by injury.  The muscles or cartilage can be strained by coughing or overwork.  The cartilage can be affected by inflammation and become sore (costochondritis). DIAGNOSIS  Lab tests or other studies may be needed to find the cause of your pain. Your health care provider may have you take a test called an ambulatory electrocardiogram (ECG). An ECG records your heartbeat patterns over a 24-hour period. You may also have other tests, such as:  Transthoracic echocardiogram (TTE). During echocardiography, sound waves are used to evaluate how blood flows through your heart.  Transesophageal echocardiogram (TEE).  Cardiac monitoring. This allows your health care provider to monitor your heart rate and rhythm in real time.  Holter monitor. This is a portable device that records your heartbeat and can help diagnose heart  arrhythmias. It allows your health care provider to track your heart activity for several days, if needed.  Stress tests by exercise or by giving medicine that makes the heart beat faster. TREATMENT   Treatment depends on what may be causing your chest pain. Treatment may include:  Acid blockers for heartburn.  Anti-inflammatory medicine.  Pain medicine for inflammatory conditions.  Antibiotics if an infection is present.  You may be advised to change lifestyle habits. This includes stopping smoking and avoiding alcohol, caffeine, and chocolate.  You may be advised to keep your head raised (elevated) when sleeping. This reduces the chance of acid going backward from your stomach into your esophagus. Most of the time, nonspecific chest pain will improve within 2-3 days with rest and mild pain medicine.  HOME CARE INSTRUCTIONS   If antibiotics were prescribed, take them as directed. Finish them even if you start to feel better.  For the next few days, avoid physical activities that bring on chest pain. Continue physical activities as directed.  Do not use any tobacco products, including cigarettes, chewing tobacco, or electronic cigarettes.  Avoid drinking alcohol.  Only take medicine as directed by your health care provider.  Follow your health care provider's suggestions for further testing if your chest pain does not go away.  Keep any follow-up appointments you made. If you do not go to an appointment, you could develop lasting (chronic) problems with pain. If there is any problem keeping an appointment, call to reschedule. SEEK MEDICAL CARE IF:   Your chest pain  does not go away, even after treatment.  You have a rash with blisters on your chest.  You have a fever. SEEK IMMEDIATE MEDICAL CARE IF:   You have increased chest pain or pain that spreads to your arm, neck, jaw, back, or abdomen.  You have shortness of breath.  You have an increasing cough, or you cough up  blood.  You have severe back or abdominal pain.  You feel nauseous or vomit.  You have severe weakness.  You faint.  You have chills. This is an emergency. Do not wait to see if the pain will go away. Get medical help at once. Call your local emergency services (911 in [place] Do not drive yourself to the hospital. MAKE SURE YOU:   Understand these instructions.  Will watch your condition.  Will get help right away if you are not doing well or get worse. Document Released: 03/02/2005 Document Revised: 05/28/2013 Document Reviewed: 12/27/2007 [facility] Patient Information 2015 [place], [place]. This information is not intended to replace advice given to you by your health care provider. Make sure you discuss any questions you have with your health care provider.

## 2014-10-09 NOTE — ED Notes (Signed)
Pt. Arrived to ed from home via ems. Reports pt has been experiencing sharp center chest pain over the last few days. Pt reports over the last two days Pain has increase. Pt verbalized SOB, N/V with pain. Alert and oriented x 3.  Pain described as sharp/stabbing pain. Nitro x three administered by EMS and 324mg  of ASA.

## 2014-12-24 DIAGNOSIS — F329 Major depressive disorder, single episode, unspecified: Secondary | ICD-10-CM | POA: Insufficient documentation

## 2014-12-24 DIAGNOSIS — F32A Depression, unspecified: Secondary | ICD-10-CM | POA: Insufficient documentation

## 2015-02-22 ENCOUNTER — Encounter: Payer: Self-pay | Admitting: Emergency Medicine

## 2015-02-22 ENCOUNTER — Emergency Department
Admission: EM | Admit: 2015-02-22 | Discharge: 2015-02-22 | Disposition: A | Discharge status: Home / Self Care | Payer: Medicare Other | Attending: Emergency Medicine | Admitting: Emergency Medicine

## 2015-02-22 DIAGNOSIS — H1033 Unspecified acute conjunctivitis, bilateral: Secondary | ICD-10-CM | POA: Diagnosis not present

## 2015-02-22 DIAGNOSIS — H5713 Ocular pain, bilateral: Secondary | ICD-10-CM | POA: Diagnosis present

## 2015-02-22 DIAGNOSIS — Z79899 Other long term (current) drug therapy: Secondary | ICD-10-CM | POA: Insufficient documentation

## 2015-02-22 DIAGNOSIS — H10023 Other mucopurulent conjunctivitis, bilateral: Secondary | ICD-10-CM

## 2015-02-22 HISTORY — DX: Fibromyalgia: M79.7

## 2015-02-22 MED ORDER — FLUORESCEIN SODIUM 1 MG OP STRP
1.0000 | ORAL_STRIP | Freq: Once | OPHTHALMIC | Status: AC
  Administered 2015-02-22: 1 via OPHTHALMIC
  Filled 2015-02-22: qty 1

## 2015-02-22 MED ORDER — HYDROCODONE-ACETAMINOPHEN 5-325 MG PO TABS: 1.0000 | ORAL_TABLET | ORAL | Status: DC | PRN

## 2015-02-22 MED ORDER — TETRACAINE HCL 0.5 % OP SOLN
1.0000 [drp] | Freq: Once | OPHTHALMIC | Status: AC
  Administered 2015-02-22: 1 [drp] via OPHTHALMIC
  Filled 2015-02-22: qty 2

## 2015-02-22 MED ORDER — MOXIFLOXACIN HCL 0.5 % OP SOLN
1.0000 [drp] | Freq: Three times a day (TID) | OPHTHALMIC | Status: DC
  Administered 2015-02-22: 1 [drp] via OPHTHALMIC
  Filled 2015-02-22: qty 3

## 2015-02-22 MED ORDER — EYE WASH OPHTH SOLN
1.0000 [drp] | OPHTHALMIC | Status: DC | PRN
  Filled 2015-02-22: qty 118

## 2015-02-22 MED ORDER — HYDROCODONE-ACETAMINOPHEN 5-325 MG PO TABS
2.0000 | ORAL_TABLET | Freq: Once | ORAL | Status: AC
  Administered 2015-02-22: 2 via ORAL
  Filled 2015-02-22: qty 2

## 2015-02-22 NOTE — Discharge Instructions (Signed)
Bacterial Conjunctivitis °Bacterial conjunctivitis (commonly called pink eye) is redness, soreness, or puffiness (inflammation) of the white part of your eye. It is caused by a germ called bacteria. These germs can easily spread from person to person (contagious). Your eye often will become red or pink. Your eye may also become irritated, watery, or have a thick discharge.  °HOME CARE  °· Apply a cool, clean washcloth over closed eyelids. Do this for 10-20 minutes, 3-4 times a day while you have pain. °· Gently wipe away any fluid coming from the eye with a warm, wet washcloth or cotton ball. °· Wash your hands often with soap and water. Use paper towels to dry your hands. °· Do not share towels or washcloths. °· Change or wash your pillowcase every day. °· Do not use eye makeup until the infection is gone. °· Do not use machines or drive if your vision is blurry. °· Stop using contact lenses. Do not use them again until your doctor says it is okay. °· Do not touch the tip of the eye drop bottle or medicine tube with your fingers when you put medicine on the eye. °GET HELP RIGHT AWAY IF:  °· Your eye is not better after 3 days of starting your medicine. °· You have a yellowish fluid coming out of the eye. °· You have more pain in the eye. °· Your eye redness is spreading. °· Your vision becomes blurry. °· You have a fever or lasting symptoms for more than 2-3 days. °· You have a fever and your symptoms suddenly get worse. °· You have pain in the face. °· Your face gets red or puffy (swollen). °MAKE SURE YOU:  °· Understand these instructions. °· Will watch this condition. °· Will get help right away if you are not doing well or get worse. °Document Released: 03/01/2008 Document Revised: 05/09/2012 Document Reviewed: 01/27/2012 °ExitCare® Patient Information ©2015 [employer]. This information is not intended to replace advice given to you by your health care provider. Make sure you discuss any questions you have  with your health care provider. ° °

## 2015-02-22 NOTE — ED Provider Notes (Signed)
[facility] Emergency Department Provider Note  ____________________________________________  Time seen: Approximately 2:40 PM  I have reviewed the triage vital signs and the nursing notes.   HISTORY  Chief Complaint Eye Pain   HPI Ana [name] is a 49 y.o. female is here with complaint of eye pain. Patient reports that her left eye began with pain and redness possibly 3 days ago. She states that there was some clear drainage from it but never any crusting or matting of the lashes. This morning she woke with her right eye during the same with redness and pain. She states she is very light sensitive. She does wear contacts and takes them out each night. She states that she cleans them with a solution as she was instructed. She denies any problems with her contacts. She has not seen an ophthalmologist or optometrist since living in this area. She states that this happened one time before and was told by the ER doctor that it was a bacterial infection. She states the symptoms were very similar to what she is experiencing now. She denies knowing anyone in her family who has glaucoma. She denies any injury or known foreign body. She states that for the last several days she has been using Visine to flush her eyes out. She said family members look at her eyes trying to find a foreign body. Pain is constant and is currently a 10 out of 10.   Past Medical History  Diagnosis Date  . Lupus 1990  . Fibromyalgia     There are no active problems to display for this patient.   Past Surgical History  Procedure Laterality Date  . Cholecystectomy    . Right ankle surgery    . Cesarean section      Current Outpatient Rx  Name  Route  Sig  Dispense  Refill  . gabapentin (NEURONTIN) 300 MG capsule   Oral   Take 300 mg by mouth 2 (two) times daily.         Marland Kitchen HYDROcodone-acetaminophen (NORCO/VICODIN) 5-325 MG per tablet   Oral   Take 1 tablet by mouth every 4 (four)  hours as needed for moderate pain.   20 tablet   0   . oxyCODONE (OXY IR/ROXICODONE) 5 MG immediate release tablet   Oral   Take 5 mg by mouth every 12 (twelve) hours as needed for severe pain.           Allergies Review of patient's allergies indicates no known allergies.  No family history on file.  Social History Social History  Substance Use Topics  . Smoking status: Never Smoker   . Smokeless tobacco: None  . Alcohol Use: 1.2 oz/week    2 Cans of beer per week    Review of Systems Constitutional: No fever/chills Eyes: Bilateral eye pain and redness ENT: No sore throat. Cardiovascular: Denies chest pain. Respiratory: Denies shortness of breath. Gastrointestinal:   No nausea, no vomiting.  .Musculoskeletal: Negative for back pain. Skin: Negative for rash. Neurological: Negative for headaches, focal weakness or numbness.  10-point ROS otherwise negative.  ____________________________________________   PHYSICAL EXAM:  VITAL SIGNS: ED Triage Vitals  Enc Vitals Group     BP 02/22/15 1415 158/95 mmHg     Pulse Rate 02/22/15 1415 97     Resp 02/22/15 1415 18     Temp 02/22/15 1415 98.2 F (36.8 C)     Temp Source 02/22/15 1415 Oral     SpO2 02/22/15  1415 100 %     Weight 02/22/15 1415 180 lb (81.647 kg)     Height 02/22/15 1415 5\' 4"  (1.626 m)     Head Cir --      Peak Flow --      Pain Score 02/22/15 1419 10     Pain Loc --      Pain Edu? --      Excl. in [place]? --     Constitutional: Alert and oriented. Well appearing and in no acute distress. Eyes:  PERRL. EOMI. right sclera is mildly injected, patient is extremely sensitive to light. Left eye also mildly injected. There is no drainage noted from either eye. Head: Atraumatic. Nose: No congestion/rhinnorhea. Neck: No stridor.   Cardiovascular: Normal rate, regular rhythm. Grossly normal heart sounds.  Good peripheral circulation. Respiratory: Normal respiratory effort.  No retractions. Lungs  CTAB. Gastrointestinal: Soft and nontender. No distention.  Musculoskeletal: No lower extremity tenderness nor edema.  No joint effusions. Neurologic:  Normal speech and language. No gross focal neurologic deficits are appreciated. No gait instability. Skin:  Skin is warm, dry and intact. No rash noted. Psychiatric: Mood and affect are normal. Speech and behavior are normal.  ____________________________________________   LABS (all labs ordered are listed, but only abnormal results are displayed)  Labs Reviewed - No data to display  PROCEDURES  Procedure(s) performed: Tetracaine was placed in both eyes. Initial inspection did not show any foreign body bilaterally. Lids were inverted with no foreign body seen there. fluroscene dye was placed in each eye without corneal uptake. No foreign body noted. Both eyes were irrigated with eyewash. Tono-Pen measured 20 and 18. Dr. Jimmye Norman was present at this time and also examined eyes.  Critical Care performed: No  ____________________________________________   INITIAL IMPRESSION / ASSESSMENT AND PLAN / ED COURSE  Pertinent labs & imaging results that were available during my care of the patient were reviewed by me and considered in my medical decision making (see chart for details).  Patient was placed on Vigamox ophthalmic solution with instructions to begin using it immediately 1 drop each eye twice a day. She is to call [facility] first thing in the morning for recheck of her eyes. She is also discharged with a prescription for Norco as needed for pain. ____________________________________________   FINAL CLINICAL IMPRESSION(S) / ED DIAGNOSES  Final diagnoses:  Acute bacterial conjunctivitis, bilateral      Johnn Hai, PA-C 02/22/15 [place], MD 02/25/15 [phone]

## 2015-02-22 NOTE — ED Notes (Addendum)
Pt reports left eye pain and redness that started 3 days ago and right eye pain and redness that started this morning. Light sensitivity and clear drainage per pt

## 2015-04-24 ENCOUNTER — Emergency Department
Admission: EM | Admit: 2015-04-24 | Discharge: 2015-04-24 | Disposition: A | Discharge status: Home / Self Care | Payer: Medicare Other | Attending: Emergency Medicine | Admitting: Emergency Medicine

## 2015-04-24 DIAGNOSIS — R110 Nausea: Secondary | ICD-10-CM | POA: Diagnosis not present

## 2015-04-24 DIAGNOSIS — F419 Anxiety disorder, unspecified: Secondary | ICD-10-CM | POA: Insufficient documentation

## 2015-04-24 DIAGNOSIS — R109 Unspecified abdominal pain: Secondary | ICD-10-CM | POA: Insufficient documentation

## 2015-04-24 DIAGNOSIS — R5383 Other fatigue: Secondary | ICD-10-CM | POA: Diagnosis present

## 2015-04-24 LAB — CBC
HEMATOCRIT: 40.2 % (ref 35.0–47.0)
HEMOGLOBIN: 13.2 g/dL (ref 12.0–16.0)
MCH: 29.3 pg (ref 26.0–34.0)
MCHC: 32.9 g/dL (ref 32.0–36.0)
MCV: 89.2 fL (ref 80.0–100.0)
Platelets: 252 10*3/uL (ref 150–440)
RBC: 4.5 MIL/uL (ref 3.80–5.20)
RDW: 12.8 % (ref 11.5–14.5)
WBC: 4.3 10*3/uL (ref 3.6–11.0)

## 2015-04-24 LAB — COMPREHENSIVE METABOLIC PANEL
ALBUMIN: 4.1 g/dL (ref 3.5–5.0)
ALT: 22 U/L (ref 14–54)
ANION GAP: 9 (ref 5–15)
AST: 23 U/L (ref 15–41)
Alkaline Phosphatase: 62 U/L (ref 38–126)
BUN: 15 mg/dL (ref 6–20)
CHLORIDE: 96 mmol/L — AB (ref 101–111)
CO2: 32 mmol/L (ref 22–32)
Calcium: 8.6 mg/dL — ABNORMAL LOW (ref 8.9–10.3)
Creatinine, Ser: 0.98 mg/dL (ref 0.44–1.00)
GFR calc non Af Amer: >60 mL/min (ref >60)
Glucose, Bld: 93 mg/dL (ref 65–99)
Potassium: 3.8 mmol/L (ref 3.5–5.1)
Sodium: 137 mmol/L (ref 135–145)
Total Bilirubin: 0.6 mg/dL (ref 0.3–1.2)
Total Protein: 7.7 g/dL (ref 6.5–8.1)

## 2015-04-24 LAB — LIPASE, BLOOD: Lipase: 25 U/L (ref 11–51)

## 2015-04-24 MED ORDER — PROMETHAZINE HCL 25 MG PO TABS
25.0000 mg | ORAL_TABLET | Freq: Once | ORAL | Status: AC
  Administered 2015-04-24: 25 mg via ORAL
  Filled 2015-04-24: qty 1

## 2015-04-24 MED ORDER — DIAZEPAM 5 MG PO TABS
5.0000 mg | ORAL_TABLET | Freq: Once | ORAL | Status: AC
  Administered 2015-04-24: 5 mg via ORAL
  Filled 2015-04-24: qty 1

## 2015-04-24 MED ORDER — DIAZEPAM 5 MG PO TABS: 5.0000 mg | ORAL_TABLET | Freq: Three times a day (TID) | ORAL | Status: DC | PRN

## 2015-04-24 MED ORDER — ONDANSETRON 4 MG PO TBDP
4.0000 mg | ORAL_TABLET | Freq: Once | ORAL | Status: AC
  Administered 2015-04-24: 4 mg via ORAL
  Filled 2015-04-24: qty 1

## 2015-04-24 MED ORDER — PROMETHAZINE HCL 25 MG PO TABS: 25.0000 mg | ORAL_TABLET | Freq: Four times a day (QID) | ORAL | Status: DC | PRN

## 2015-04-24 NOTE — Discharge Instructions (Signed)
Abdominal Pain, Adult Many things can cause abdominal pain. Usually, abdominal pain is not caused by a disease and will improve without treatment. It can often be observed and treated at home. Your health care provider will do a physical exam and possibly order blood tests and X-rays to help determine the seriousness of your pain. However, in many cases, more time must pass before a clear cause of the pain can be found. Before that point, your health care provider may not know if you need more testing or further treatment. HOME CARE INSTRUCTIONS Monitor your abdominal pain for any changes. The following actions may help to alleviate any discomfort you are experiencing:  Only take over-the-counter or prescription medicines as directed by your health care provider.  Do not take laxatives unless directed to do so by your health care provider.  Try a clear liquid diet (broth, tea, or water) as directed by your health care provider. Slowly move to a bland diet as tolerated. SEEK MEDICAL CARE IF:  You have unexplained abdominal pain.  You have abdominal pain associated with nausea or diarrhea.  You have pain when you urinate or have a bowel movement.  You experience abdominal pain that wakes you in the night.  You have abdominal pain that is worsened or improved by eating food.  You have abdominal pain that is worsened with eating fatty foods.  You have a fever. SEEK IMMEDIATE MEDICAL CARE IF:  Your pain does not go away within 2 hours.  You keep throwing up (vomiting).  Your pain is felt only in portions of the abdomen, such as the right side or the left lower portion of the abdomen.  You pass bloody or black tarry stools. MAKE SURE YOU:  Understand these instructions.  Will watch your condition.  Will get help right away if you are not doing well or get worse.   This information is not intended to replace advice given to you by your health care provider. Make sure you discuss  any questions you have with your health care provider.   Document Released: 03/02/2005 Document Revised: 02/11/2015 Document Reviewed: 01/30/2013 Elsevier Interactive Patient Education 2016 Elsevier Inc.  Panic Attacks Panic attacks are sudden, short-livedsurges of severe anxiety, fear, or discomfort. They may occur for no reason when you are relaxed, when you are anxious, or when you are sleeping. Panic attacks may occur for a number of reasons:   Healthy people occasionally have panic attacks in extreme, life-threatening situations, such as war or natural disasters. Normal anxiety is a protective mechanism of the body that helps [place] react to danger (fight or flight response).  Panic attacks are often seen with anxiety disorders, such as panic disorder, social anxiety disorder, generalized anxiety disorder, and phobias. Anxiety disorders cause excessive or uncontrollable anxiety. They may interfere with your relationships or other life activities.  Panic attacks are sometimes seen with other mental illnesses, such as depression and posttraumatic stress disorder.  Certain medical conditions, prescription medicines, and drugs of abuse can cause panic attacks. SYMPTOMS  Panic attacks start suddenly, peak within 20 minutes, and are accompanied by four or more of the following symptoms:  Pounding heart or fast heart rate (palpitations).  Sweating.  Trembling or shaking.  Shortness of breath or feeling smothered.  Feeling choked.  Chest pain or discomfort.  Nausea or strange feeling in your stomach.  Dizziness, light-headedness, or feeling like you will faint.  Chills or hot flushes.  Numbness or tingling in your lips or hands  and feet.  Feeling that things are not real or feeling that you are not yourself.  Fear of losing control or going crazy.  Fear of dying. Some of these symptoms can mimic serious medical conditions. For example, you may think you are having a heart  attack. Although panic attacks can be very scary, they are not life threatening. DIAGNOSIS  Panic attacks are diagnosed through an assessment by your health care provider. Your health care provider will ask questions about your symptoms, such as where and when they occurred. Your health care provider will also ask about your medical history and use of alcohol and drugs, including prescription medicines. Your health care provider may order blood tests or other studies to rule out a serious medical condition. Your health care provider may refer you to a mental health professional for further evaluation. TREATMENT   Most healthy people who have one or two panic attacks in an extreme, life-threatening situation will not require treatment.  The treatment for panic attacks associated with anxiety disorders or other mental illness typically involves counseling with a mental health professional, medicine, or a combination of both. Your health care provider will help determine what treatment is best for you.  Panic attacks due to physical illness usually go away with treatment of the illness. If prescription medicine is causing panic attacks, talk with your health care provider about stopping the medicine, decreasing the dose, or substituting another medicine.  Panic attacks due to alcohol or drug abuse go away with abstinence. Some adults need professional help in order to stop drinking or using drugs. HOME CARE INSTRUCTIONS   Take all medicines as directed by your health care provider.   Schedule and attend follow-up visits as directed by your health care provider. It is important to keep all your appointments. SEEK MEDICAL CARE IF:  You are not able to take your medicines as prescribed.  Your symptoms do not improve or get worse. SEEK IMMEDIATE MEDICAL CARE IF:   You experience panic attack symptoms that are different than your usual symptoms.  You have serious thoughts about hurting yourself or  others.  You are taking medicine for panic attacks and have a serious side effect. MAKE SURE YOU:  Understand these instructions.  Will watch your condition.  Will get help right away if you are not doing well or get worse.   This information is not intended to replace advice given to you by your health care provider. Make sure you discuss any questions you have with your health care provider.   Document Released: 05/23/2005 Document Revised: 05/28/2013 Document Reviewed: 01/04/2013 Elsevier Interactive Patient Education [employer].

## 2015-04-24 NOTE — ED Provider Notes (Addendum)
[facility] Emergency Department Provider Note     Time seen: ----------------------------------------- 4:40 PM on 04/24/2015 -----------------------------------------    I have reviewed the triage vital signs and the nursing notes.   HISTORY  Chief Complaint Fatigue    HPI Ana [name] is a 49 y.o. female who presents to ER for generalized weakness, shakiness today. Patient states she has history of lupus and sometimes she has these symptoms, states she did not eat today. Patient also complaining of intermittent abdominal pain and nausea that started today. Her doctor has prescribed omeprazole for the symptoms. Patient has not started taking BuSpar or Effexor yet.   Past Medical History  Diagnosis Date  . Lupus ([place]) 1990  . Fibromyalgia     There are no active problems to display for this patient.   Past Surgical History  Procedure Laterality Date  . Cholecystectomy    . Right ankle surgery    . Cesarean section    . Abdominal hysterectomy      Allergies Mirtazapine  Social History Social History  Substance Use Topics  . Smoking status: Never Smoker   . Smokeless tobacco: None  . Alcohol Use: 1.2 oz/week    2 Cans of beer per week    Review of Systems Constitutional: Negative for fever. Eyes: Negative for visual changes. ENT: Negative for sore throat. Cardiovascular: Negative for chest pain. Respiratory: Negative for shortness of breath. Gastrointestinal: Positive for abdominal pain and nausea Genitourinary: Negative for dysuria. Musculoskeletal: Negative for back pain. Skin: Negative for rash. Neurological: Negative for headaches, positive for weakness  10-point ROS otherwise negative.  ____________________________________________   PHYSICAL EXAM:  VITAL SIGNS: ED Triage Vitals  Enc Vitals Group     BP 04/24/15 1557 130/86 mmHg     Pulse Rate 04/24/15 1557 108     Resp 04/24/15 1557 16     Temp 04/24/15 1557 98.8  F (37.1 C)     Temp Source 04/24/15 1557 Oral     SpO2 04/24/15 1557 98 %     Weight 04/24/15 1557 180 lb (81.647 kg)     Height 04/24/15 1557 5\' 4"  (1.626 m)     Head Cir --      Peak Flow --      Pain Score 04/24/15 1557 9     Pain Loc --      Pain Edu? --      Excl. in [place]? --     Constitutional: Alert and oriented. Well appearing and in no distress. Eyes: Conjunctivae are normal. PERRL. Normal extraocular movements. ENT   Head: Normocephalic and atraumatic.   Nose: No congestion/rhinnorhea.   Mouth/Throat: Mucous membranes are moist.   Neck: No stridor. Cardiovascular: Normal rate, regular rhythm. Normal and symmetric distal pulses are present in all extremities. No murmurs, rubs, or gallops. Respiratory: Normal respiratory effort without tachypnea nor retractions. Breath sounds are clear and equal bilaterally. No wheezes/rales/rhonchi. Gastrointestinal: Soft and nontender. No distention. No abdominal bruits.  Musculoskeletal: Nontender with normal range of motion in all extremities. No joint effusions.  No lower extremity tenderness nor edema. Neurologic:  Normal speech and language. No gross focal neurologic deficits are appreciated. Speech is normal. No gait instability. Skin:  Skin is warm, dry and intact. No rash noted. Psychiatric: Mood and affect are normal. Speech and behavior are normal. Patient exhibits appropriate insight and judgment. ____________________________________________  ED COURSE:  Pertinent labs & imaging results that were available during my care of the patient were  reviewed by me and considered in my medical decision making (see chart for details). Patient is in no acute distress, will check basic labs, give oral Zofran and reevaluate. ____________________________________________    LABS (pertinent positives/negatives)  Labs Reviewed  COMPREHENSIVE METABOLIC PANEL - Abnormal; Notable for the following:    Chloride 96 (*)    Calcium  8.6 (*)    All other components within normal limits  LIPASE, BLOOD  CBC  URINALYSIS COMPLETEWITH MICROSCOPIC ([facility] ONLY)   ____________________________________________  FINAL ASSESSMENT AND PLAN  Abdominal pain, nausea  Plan: Patient with labs and imaging as dictated above. Patient is in no acute distress, likely combination of either generalized anxiety, panic attacks or fibromyalgia. Patient be discharged with antiemetics and encouraged close follow-up with her primary care doctor.   Earleen Newport, MD   Earleen Newport, MD 04/24/15 [place], MD 04/24/15 [phone]

## 2015-04-24 NOTE — ED Notes (Addendum)
Pt c/o eye redness, feeling generalized weakness, shaky today.. States she has a hx of lupus and sometimes has these sx.. States she did eat today.. Pt also c/o intermittent abd pain with nausea that started today

## 2015-08-03 ENCOUNTER — Emergency Department
Admission: EM | Admit: 2015-08-03 | Discharge: 2015-08-03 | Disposition: A | Discharge status: Home / Self Care | Payer: Medicare Other | Attending: Emergency Medicine | Admitting: Emergency Medicine

## 2015-08-03 ENCOUNTER — Encounter: Payer: Self-pay | Admitting: Emergency Medicine

## 2015-08-03 ENCOUNTER — Emergency Department: Payer: Medicare Other

## 2015-08-03 DIAGNOSIS — R51 Headache: Secondary | ICD-10-CM | POA: Insufficient documentation

## 2015-08-03 DIAGNOSIS — Z79899 Other long term (current) drug therapy: Secondary | ICD-10-CM | POA: Diagnosis not present

## 2015-08-03 DIAGNOSIS — M542 Cervicalgia: Secondary | ICD-10-CM | POA: Diagnosis present

## 2015-08-03 DIAGNOSIS — Z7952 Long term (current) use of systemic steroids: Secondary | ICD-10-CM | POA: Insufficient documentation

## 2015-08-03 DIAGNOSIS — M436 Torticollis: Secondary | ICD-10-CM

## 2015-08-03 MED ORDER — KETOROLAC TROMETHAMINE 30 MG/ML IJ SOLN
INTRAMUSCULAR | Status: AC
  Administered 2015-08-03: 30 mg via INTRAMUSCULAR
  Filled 2015-08-03: qty 1

## 2015-08-03 MED ORDER — KETOROLAC TROMETHAMINE 30 MG/ML IJ SOLN
30.0000 mg | Freq: Once | INTRAMUSCULAR | Status: AC
  Administered 2015-08-03: 30 mg via INTRAMUSCULAR

## 2015-08-03 MED ORDER — TRAMADOL HCL 50 MG PO TABS: 50.0000 mg | ORAL_TABLET | Freq: Four times a day (QID) | ORAL | Status: DC | PRN

## 2015-08-03 NOTE — Discharge Instructions (Signed)
Acute Torticollis °Torticollis is a condition in which the muscles of the neck tighten (contract) abnormally, causing the neck to twist and the head to move into an unnatural position. Torticollis that develops suddenly is called acute torticollis. If torticollis becomes chronic and is left untreated, the face and neck can become deformed. °CAUSES °This condition may be caused by: °· Sleeping in an awkward position (common). °· Extending or twisting the neck muscles beyond their normal position. °· Infection. °In some cases, the cause may not be known. °SYMPTOMS °Symptoms of this condition include: °· An unnatural position of the head. °· Neck pain. °· A limited ability to move the neck. °· Twisting of the neck to one side. °DIAGNOSIS °This condition is diagnosed with a physical exam. You may also have imaging tests, such as an X-ray, CT scan, or MRI. °TREATMENT °Treatment for this condition involves trying to relax the neck muscles. It may include: °· Medicines or shots. °· Physical therapy. °· Surgery. This may be done in severe cases. °HOME CARE INSTRUCTIONS °· Take medicines only as directed by your health care provider. °· Do stretching exercises and massage your neck as directed by your health care provider. °· Keep all follow-up visits as directed by your health care provider. This is important. °SEEK MEDICAL CARE IF: °· You develop a fever. °SEEK IMMEDIATE MEDICAL CARE IF: °· You develop difficulty breathing. °· You develop noisy breathing (stridor). °· You start drooling. °· You have trouble swallowing or have pain with swallowing. °· You develop numbness or weakness in your hands or feet. °· You have changes in your speech, understanding, or vision. °· Your pain gets worse. °  °This information is not intended to replace advice given to you by your health care provider. Make sure you discuss any questions you have with your health care provider. °  °Document Released: 05/20/2000 Document Revised:  10/07/2014 Document Reviewed: 05/19/2014 °Elsevier Interactive Patient Education ©2016 Elsevier Inc. ° °

## 2015-08-03 NOTE — ED Provider Notes (Signed)
[facility] Emergency Department Provider Note  ____________________________________________  Time seen: On arrival  I have reviewed the triage vital signs and the nursing notes.   HISTORY  Chief Complaint Neck Pain    HPI KAILEE GARNO is a 50 y.o. female who presents with complaints of primarily left greater than right neck pain which is radiating into her left shoulder. She reports it also travels into the back of her head. She think she may have a tension headache. She put the pain is worse with rotating her head. She denies injury or fall. No fevers or chills. No rash. No focal deficits. Patient apparently has a history of lupus and fibromyalgia    Past Medical History  Diagnosis Date  . Lupus ([place]) 1990  . Fibromyalgia     There are no active problems to display for this patient.   Past Surgical History  Procedure Laterality Date  . Cholecystectomy    . Right ankle surgery    . Cesarean section    . Abdominal hysterectomy      Current Outpatient Rx  Name  Route  Sig  Dispense  Refill  . busPIRone (BUSPAR) 10 MG tablet   Oral   Take 10 mg by mouth 3 (three) times daily.         . diazepam (VALIUM) 5 MG tablet   Oral   Take 1 tablet (5 mg total) by mouth every 8 (eight) hours as needed for anxiety.   20 tablet   0   . gabapentin (NEURONTIN) 300 MG capsule   Oral   Take 300 mg by mouth 2 (two) times daily.         . hydrochlorothiazide (HYDRODIURIL) 25 MG tablet   Oral   Take 25 mg by mouth daily.         Marland Kitchen HYDROcodone-acetaminophen (NORCO/VICODIN) 5-325 MG per tablet   Oral   Take 1 tablet by mouth every 4 (four) hours as needed for moderate pain.   20 tablet   0   . lisinopril (PRINIVIL,ZESTRIL) 10 MG tablet   Oral   Take 10 mg by mouth daily.         Marland Kitchen omeprazole (PRILOSEC) 40 MG capsule   Oral   Take 200 mg by mouth daily.         . OXcarbazepine (TRILEPTAL) 150 MG tablet   Oral   Take 150 mg by mouth 2  (two) times daily.         Marland Kitchen oxyCODONE (OXY IR/ROXICODONE) 5 MG immediate release tablet   Oral   Take 5 mg by mouth every 12 (twelve) hours as needed for severe pain.         . predniSONE (DELTASONE) 10 MG tablet   Oral   Take 10 mg by mouth.         . promethazine (PHENERGAN) 25 MG tablet   Oral   Take 1 tablet (25 mg total) by mouth every 6 (six) hours as needed for nausea or vomiting.   20 tablet   0   . venlafaxine (EFFEXOR) 100 MG tablet   Oral   Take 150 mg by mouth daily.           Allergies Mirtazapine  No family history on file.  Social History Social History  Substance Use Topics  . Smoking status: Never Smoker   . Smokeless tobacco: None  . Alcohol Use: 1.2 oz/week    2 Cans of beer per week  Review of Systems  Constitutional: Negative for fever. Eyes: Negative for visual changes. ENT: Negative for sore throat   Genitourinary: Negative for incontinence Musculoskeletal: As above for neck pain Skin: Negative for rash. Neurological: Negative for  focal weakness   ____________________________________________   PHYSICAL EXAM:  VITAL SIGNS: ED Triage Vitals  Enc Vitals Group     BP 08/03/15 1117 164/99 mmHg     Pulse Rate 08/03/15 1117 83     Resp 08/03/15 1117 20     Temp 08/03/15 1117 97.7 F (36.5 C)     Temp Source 08/03/15 1117 Oral     SpO2 08/03/15 1117 100 %     Weight 08/03/15 1117 175 lb (79.379 kg)     Height 08/03/15 1117 5\' 4"  (1.626 m)     Head Cir --      Peak Flow --      Pain Score 08/03/15 1118 10     Pain Loc --      Pain Edu? --      Excl. in [place]? --     Constitutional: Alert and oriented. Well appearing and in no distress. Eyes: Conjunctivae are normal.  ENT   Head: Normocephalic and atraumatic.   Mouth/Throat: Mucous membranes are moist. Normal Cardiovascular: Normal rate, regular rhythm.  Respiratory: Normal respiratory effort without tachypnea nor retractions.  Gastrointestinal: Soft and  non-tender in all quadrants. No distention. There is no CVA tenderness. Musculoskeletal: Nontender with normal range of motion in all extremities. Tenderness to palpation along bilateral trapezius muscles. No vertebral tenderness to palpation. Neurologic:  Normal speech and language. No gross focal neurologic deficits are appreciated. Normal strength in the upper extremities Skin:  Skin is warm, dry and intact. No rash noted. Psychiatric: Mood and affect are normal. Patient exhibits appropriate insight and judgment.  ____________________________________________    LABS (pertinent positives/negatives)  Labs Reviewed - No data to display  ____________________________________________     ____________________________________________    RADIOLOGY I have personally reviewed any xrays that were ordered on this patient:  CT head and cervical spine unremarkable ____________________________________________   PROCEDURES  Procedure(s) performed: none   ____________________________________________   INITIAL IMPRESSION / ASSESSMENT AND PLAN / ED COURSE  Pertinent labs & imaging results that were available during my care of the patient were reviewed by me and considered in my medical decision making (see chart for details).  Patient overall well-appearing. Suspect trapezius muscle sprain/wry neck  Imaging is unremarkable, patient received Toradol IM with mild improvement in discomfort. I'll discharge her with tramadol that she is requesting something stronger but I think that is not appropriate for this complaint  ____________________________________________   FINAL CLINICAL IMPRESSION(S) / ED DIAGNOSES  Final diagnoses:  Torticollis, acute     Lavonia Drafts, MD 08/03/15 [phone]

## 2015-08-03 NOTE — ED Notes (Addendum)
Patient to ER for c/o left neck and shoulder pain. Patient states pain radiates up to back of head. Patient on cell phone during beginning of triage. Patient states she is unable to remove her jacket due to level of pain. Pain is worsened with movement as staff removes her arm from jacket.  Patient reports distant history of HTN, but was taken off of medications.

## 2015-08-06 ENCOUNTER — Emergency Department: Payer: Medicare Other

## 2015-08-06 ENCOUNTER — Observation Stay
Admission: EM | Admit: 2015-08-06 | Discharge: 2015-08-08 | Disposition: A | Discharge status: Home / Self Care | Payer: Medicare Other | Attending: Internal Medicine | Admitting: Internal Medicine

## 2015-08-06 ENCOUNTER — Encounter: Payer: Self-pay | Admitting: *Deleted

## 2015-08-06 DIAGNOSIS — M50223 Other cervical disc displacement at C6-C7 level: Secondary | ICD-10-CM | POA: Diagnosis not present

## 2015-08-06 DIAGNOSIS — M542 Cervicalgia: Secondary | ICD-10-CM | POA: Insufficient documentation

## 2015-08-06 DIAGNOSIS — Z833 Family history of diabetes mellitus: Secondary | ICD-10-CM | POA: Diagnosis not present

## 2015-08-06 DIAGNOSIS — Z9889 Other specified postprocedural states: Secondary | ICD-10-CM | POA: Diagnosis not present

## 2015-08-06 DIAGNOSIS — H0520 Unspecified exophthalmos: Secondary | ICD-10-CM | POA: Diagnosis not present

## 2015-08-06 DIAGNOSIS — Z79899 Other long term (current) drug therapy: Secondary | ICD-10-CM | POA: Insufficient documentation

## 2015-08-06 DIAGNOSIS — M797 Fibromyalgia: Secondary | ICD-10-CM | POA: Diagnosis not present

## 2015-08-06 DIAGNOSIS — Z9049 Acquired absence of other specified parts of digestive tract: Secondary | ICD-10-CM | POA: Diagnosis not present

## 2015-08-06 DIAGNOSIS — Z8043 Family history of malignant neoplasm of testis: Secondary | ICD-10-CM | POA: Diagnosis not present

## 2015-08-06 DIAGNOSIS — R531 Weakness: Principal | ICD-10-CM | POA: Insufficient documentation

## 2015-08-06 DIAGNOSIS — K029 Dental caries, unspecified: Secondary | ICD-10-CM | POA: Insufficient documentation

## 2015-08-06 DIAGNOSIS — M79602 Pain in left arm: Secondary | ICD-10-CM | POA: Diagnosis not present

## 2015-08-06 DIAGNOSIS — R0789 Other chest pain: Secondary | ICD-10-CM | POA: Insufficient documentation

## 2015-08-06 DIAGNOSIS — Z79891 Long term (current) use of opiate analgesic: Secondary | ICD-10-CM | POA: Insufficient documentation

## 2015-08-06 DIAGNOSIS — I10 Essential (primary) hypertension: Secondary | ICD-10-CM | POA: Diagnosis not present

## 2015-08-06 DIAGNOSIS — M50222 Other cervical disc displacement at C5-C6 level: Secondary | ICD-10-CM | POA: Insufficient documentation

## 2015-08-06 DIAGNOSIS — M329 Systemic lupus erythematosus, unspecified: Secondary | ICD-10-CM | POA: Insufficient documentation

## 2015-08-06 DIAGNOSIS — R29898 Other symptoms and signs involving the musculoskeletal system: Secondary | ICD-10-CM

## 2015-08-06 DIAGNOSIS — R51 Headache: Secondary | ICD-10-CM | POA: Diagnosis not present

## 2015-08-06 DIAGNOSIS — M7989 Other specified soft tissue disorders: Secondary | ICD-10-CM | POA: Diagnosis not present

## 2015-08-06 DIAGNOSIS — M25512 Pain in left shoulder: Secondary | ICD-10-CM | POA: Insufficient documentation

## 2015-08-06 DIAGNOSIS — Z7952 Long term (current) use of systemic steroids: Secondary | ICD-10-CM | POA: Insufficient documentation

## 2015-08-06 DIAGNOSIS — E876 Hypokalemia: Secondary | ICD-10-CM | POA: Insufficient documentation

## 2015-08-06 DIAGNOSIS — Z90710 Acquired absence of both cervix and uterus: Secondary | ICD-10-CM | POA: Insufficient documentation

## 2015-08-06 DIAGNOSIS — Z888 Allergy status to other drugs, medicaments and biological substances status: Secondary | ICD-10-CM | POA: Insufficient documentation

## 2015-08-06 DIAGNOSIS — R112 Nausea with vomiting, unspecified: Secondary | ICD-10-CM | POA: Insufficient documentation

## 2015-08-06 HISTORY — DX: Essential (primary) hypertension: I10

## 2015-08-06 LAB — SEDIMENTATION RATE: SED RATE: 21 mm/h — AB (ref 0–20)

## 2015-08-06 LAB — CBC WITH DIFFERENTIAL/PLATELET
Basophils Absolute: 0 10*3/uL (ref 0–0.1)
Basophils Relative: 1 %
EOS ABS: 0.2 10*3/uL (ref 0–0.7)
Eosinophils Relative: 6 %
HEMATOCRIT: 35.5 % (ref 35.0–47.0)
HEMOGLOBIN: 12.1 g/dL (ref 12.0–16.0)
LYMPHS ABS: 0.7 10*3/uL — AB (ref 1.0–3.6)
Lymphocytes Relative: 23 %
MCH: 30.3 pg (ref 26.0–34.0)
MCHC: 34.1 g/dL (ref 32.0–36.0)
MCV: 88.8 fL (ref 80.0–100.0)
MONOS PCT: 12 %
Monocytes Absolute: 0.4 10*3/uL (ref 0.2–0.9)
NEUTROS ABS: 1.8 10*3/uL (ref 1.4–6.5)
NEUTROS PCT: 58 %
Platelets: 182 10*3/uL (ref 150–440)
RBC: 3.99 MIL/uL (ref 3.80–5.20)
RDW: 14.2 % (ref 11.5–14.5)
WBC: 3.1 10*3/uL — AB (ref 3.6–11.0)

## 2015-08-06 LAB — TROPONIN I: Troponin I: <0.03 ng/mL (ref <0.031)

## 2015-08-06 LAB — COMPREHENSIVE METABOLIC PANEL
ALT: 16 U/L (ref 14–54)
ANION GAP: 9 (ref 5–15)
AST: 17 U/L (ref 15–41)
Albumin: 3.8 g/dL (ref 3.5–5.0)
Alkaline Phosphatase: 52 U/L (ref 38–126)
BUN: 7 mg/dL (ref 6–20)
CHLORIDE: 105 mmol/L (ref 101–111)
CO2: 25 mmol/L (ref 22–32)
CREATININE: 0.66 mg/dL (ref 0.44–1.00)
Calcium: 8.7 mg/dL — ABNORMAL LOW (ref 8.9–10.3)
Glucose, Bld: 86 mg/dL (ref 65–99)
POTASSIUM: 3.5 mmol/L (ref 3.5–5.1)
SODIUM: 139 mmol/L (ref 135–145)
Total Bilirubin: 0.4 mg/dL (ref 0.3–1.2)
Total Protein: 7.4 g/dL (ref 6.5–8.1)

## 2015-08-06 LAB — URINALYSIS COMPLETE WITH MICROSCOPIC (ARMC ONLY)
Bilirubin Urine: NEGATIVE
Glucose, UA: NEGATIVE mg/dL
Hgb urine dipstick: NEGATIVE
Leukocytes, UA: NEGATIVE
NITRITE: POSITIVE — AB
PH: 6 (ref 5.0–8.0)
PROTEIN: NEGATIVE mg/dL
RBC / HPF: NONE SEEN RBC/hpf (ref 0–5)
SPECIFIC GRAVITY, URINE: 1.024 (ref 1.005–1.030)

## 2015-08-06 MED ORDER — PENTAFLUOROPROP-TETRAFLUOROETH EX AERO
INHALATION_SPRAY | CUTANEOUS | Status: AC
  Filled 2015-08-06: qty 30

## 2015-08-06 MED ORDER — ONDANSETRON HCL 4 MG/2ML IJ SOLN
4.0000 mg | Freq: Once | INTRAMUSCULAR | Status: AC
  Administered 2015-08-06: 4 mg via INTRAVENOUS
  Filled 2015-08-06: qty 2

## 2015-08-06 MED ORDER — PENTAFLUOROPROP-TETRAFLUOROETH EX AERO
INHALATION_SPRAY | Freq: Once | CUTANEOUS | Status: AC
  Administered 2015-08-06: 19:00:00 via TOPICAL

## 2015-08-06 MED ORDER — HYDROMORPHONE HCL 1 MG/ML IJ SOLN
0.5000 mg | Freq: Once | INTRAMUSCULAR | Status: AC
  Administered 2015-08-06: 0.5 mg via INTRAVENOUS
  Filled 2015-08-06: qty 1

## 2015-08-06 MED ORDER — GADOBENATE DIMEGLUMINE 529 MG/ML IV SOLN
15.0000 mL | Freq: Once | INTRAVENOUS | Status: AC | PRN
  Administered 2015-08-06: 15 mL via INTRAVENOUS

## 2015-08-06 NOTE — ED Notes (Signed)
Pt returned from MRI °

## 2015-08-06 NOTE — ED Notes (Signed)
States neck stiffness shooting up her neck and down her back, states she was seen in Er for same complaint last week but now has chest tightness and left arm swelling

## 2015-08-06 NOTE — ED Notes (Signed)
SOC machine set up in the room.

## 2015-08-06 NOTE — ED Notes (Signed)
Pt refused IV insertion and states stopped while this RN was trying to insert IV. Pt requesting another provider to insert IV. Pt informed that it might take while before another provider comes to establish IV line.

## 2015-08-06 NOTE — ED Provider Notes (Addendum)
[facility] Emergency Department Provider Note  ____________________________________________  Time seen: Approximately 11:21 PM  I have reviewed the triage vital signs and the nursing notes.   HISTORY  Chief Complaint Neck Pain and Headache    HPI Ana [name] is a 50 y.o. female who reports several weeks of headache on the left side of the head radiating down into the neck and now progressing into the left shoulder and the left side of her chest. The pain is severe and constant worse with movement patient says this made her arm weak and she can't move her arm or grip with her left arm anymore. His been gradually progressing for weeks. Patient was here recently and had a head CT and neck CT which were normal. Patient denies any fever chills nausea vomiting or diarrhea. Patient is not short of breath. Patient says she has lupus and fibromyalgia. Patient reports she has some vague numb feeling in the area of the pain.   Past Medical History  Diagnosis Date  . Lupus ([place]) 1990  . Fibromyalgia     There are no active problems to display for this patient.   Past Surgical History  Procedure Laterality Date  . Cholecystectomy    . Right ankle surgery    . Cesarean section    . Abdominal hysterectomy      Current Outpatient Rx  Name  Route  Sig  Dispense  Refill  . busPIRone (BUSPAR) 10 MG tablet   Oral   Take 10 mg by mouth 3 (three) times daily.         . diazepam (VALIUM) 5 MG tablet   Oral   Take 1 tablet (5 mg total) by mouth every 8 (eight) hours as needed for anxiety.   20 tablet   0   . gabapentin (NEURONTIN) 300 MG capsule   Oral   Take 300 mg by mouth 2 (two) times daily.         . hydrochlorothiazide (HYDRODIURIL) 25 MG tablet   Oral   Take 25 mg by mouth daily.         Marland Kitchen HYDROcodone-acetaminophen (NORCO/VICODIN) 5-325 MG per tablet   Oral   Take 1 tablet by mouth every 4 (four) hours as needed for moderate pain.   20  tablet   0   . lisinopril (PRINIVIL,ZESTRIL) 10 MG tablet   Oral   Take 10 mg by mouth daily.         Marland Kitchen omeprazole (PRILOSEC) 40 MG capsule   Oral   Take 200 mg by mouth daily.         . OXcarbazepine (TRILEPTAL) 150 MG tablet   Oral   Take 150 mg by mouth 2 (two) times daily.         Marland Kitchen oxyCODONE (OXY IR/ROXICODONE) 5 MG immediate release tablet   Oral   Take 5 mg by mouth every 12 (twelve) hours as needed for severe pain.         . predniSONE (DELTASONE) 10 MG tablet   Oral   Take 10 mg by mouth.         . promethazine (PHENERGAN) 25 MG tablet   Oral   Take 1 tablet (25 mg total) by mouth every 6 (six) hours as needed for nausea or vomiting.   20 tablet   0   . traMADol (ULTRAM) 50 MG tablet   Oral   Take 1 tablet (50 mg total) by mouth every 6 (six) hours  as needed.   20 tablet   0   . venlafaxine (EFFEXOR) 100 MG tablet   Oral   Take 150 mg by mouth daily.           Allergies Mirtazapine  History reviewed. No pertinent family history.  Social History Social History  Substance Use Topics  . Smoking status: Never Smoker   . Smokeless tobacco: None  . Alcohol Use: 1.2 oz/week    2 Cans of beer per week    Review of Systems Constitutional: No fever/chills Eyes: No visual changes. ENT: No sore throat. Cardiovascular: See history of present illness Respiratory: Denies shortness of breath. Gastrointestinal: No abdominal pain.  No nausea, no vomiting.  No diarrhea.  No constipation. Genitourinary: Negative for dysuria. Musculoskeletal: Negative for back pain. Skin: Negative for rash. Neurological: See history of present illness  10-point ROS otherwise negative.  ____________________________________________   PHYSICAL EXAM:  VITAL SIGNS: ED Triage Vitals  Enc Vitals Group     BP 08/06/15 1432 165/103 mmHg     Pulse Rate 08/06/15 1432 84     Resp 08/06/15 1432 18     Temp 08/06/15 1432 98.7 F (37.1 C)     Temp Source 08/06/15  1432 Oral     SpO2 08/06/15 1432 99 %     Weight 08/06/15 1432 175 lb (79.379 kg)     Height 08/06/15 1432 5\' 4"  (1.626 m)     Head Cir --      Peak Flow --      Pain Score 08/06/15 1433 10     Pain Loc --      Pain Edu? --      Excl. in [place]? --     Constitutional: Alert and oriented. Eyes: Conjunctivae are normal. PERRL. EOMI. Head: Atraumatic. Nose: No congestion/rhinnorhea. Mouth/Throat: Mucous membranes are moist.  Oropharynx non-erythematous. Neck: No stridor. Patient complains of pain on palpation left side of the head neck and shoulder. Cardiovascular: Normal rate, regular rhythm. Grossly normal heart sounds.  Good peripheral circulation. Respiratory: Normal respiratory effort.  No retractions. Lungs CTAB. Gastrointestinal: Soft and nontender. No distention. No abdominal bruits. No CVA tenderness. Musculoskeletal: No lower extremity tenderness nor edema.  No joint effusions. Left arm is tender to palpation and patient reports week. Patient is unable to squeeze my hand with the left hand.  Neurologic:  Normal speech and language. Please see above description of arm weakness cranial nerves II through XII are intact motor and right side in the legs seem to be normal. Skin:  Skin is warm, dry and intact. No rash noted.   ____________________________________________   LABS (all labs ordered are listed, but only abnormal results are displayed)  Labs Reviewed  COMPREHENSIVE METABOLIC PANEL - Abnormal; Notable for the following:    Calcium 8.7 (*)    All other components within normal limits  CBC WITH DIFFERENTIAL/PLATELET - Abnormal; Notable for the following:    WBC 3.1 (*)    Lymphs Abs 0.7 (*)    All other components within normal limits  URINALYSIS COMPLETEWITH MICROSCOPIC ([facility] ONLY) - Abnormal; Notable for the following:    Color, Urine YELLOW (*)    APPearance HAZY (*)    Ketones, ur 1+ (*)    Nitrite POSITIVE (*)    Bacteria, UA RARE (*)    Squamous Epithelial /  LPF 0-5 (*)    All other components within normal limits  SEDIMENTATION RATE - Abnormal; Notable for the following:    Sed  Rate 21 (*)    All other components within normal limits  TROPONIN I  C-REACTIVE PROTEIN   ____________________________________________  EKG  EKG read and interpreted by me shows normal sinus rhythm rate of 80 left axis mild diffuse ST-T wave flattening similar to 228 and 08/03/2015 ____________________________________________  RADIOLOGY  MRI of the head and neck by radiology shows no acute problems however there is a lot of interference from her at all here ____________________________________________   PROCEDURES  We will obtain a neurology associated consult since she has his arm problem and Dr. Marcelene Butte will make final disposition based on the suggestions of a neurology consult  ____________________________________________   INITIAL IMPRESSION / [place] / ED COURSE  Pertinent labs & imaging results that were available during my care of the patient were reviewed by me and considered in my medical decision making (see chart for details).   ____________________________________________   FINAL CLINICAL IMPRESSION(S) / ED DIAGNOSES  Final diagnoses:  Left arm weakness      Nena Polio, MD 08/06/15 2329  (See neurology sees patient and recommends MRI brachial plexus and bedside neurological exam he recommends admission to do these.  Nena Polio, MD 08/06/15 2350  Real diagnosis his inability to move arm  Nena Polio, MD 08/06/15 [phone]

## 2015-08-06 NOTE — ED Notes (Signed)
Attempted IV x2 without success  

## 2015-08-06 NOTE — ED Notes (Signed)
Patient transported to MRI 

## 2015-08-07 ENCOUNTER — Encounter: Payer: Self-pay | Admitting: Internal Medicine

## 2015-08-07 ENCOUNTER — Observation Stay: Payer: Medicare Other

## 2015-08-07 DIAGNOSIS — R29898 Other symptoms and signs involving the musculoskeletal system: Secondary | ICD-10-CM | POA: Diagnosis not present

## 2015-08-07 DIAGNOSIS — R531 Weakness: Secondary | ICD-10-CM | POA: Diagnosis not present

## 2015-08-07 LAB — CBC
HCT: 36.9 % (ref 35.0–47.0)
Hemoglobin: 12.3 g/dL (ref 12.0–16.0)
MCH: 29.6 pg (ref 26.0–34.0)
MCHC: 33.4 g/dL (ref 32.0–36.0)
MCV: 88.5 fL (ref 80.0–100.0)
PLATELETS: 173 10*3/uL (ref 150–440)
RBC: 4.16 MIL/uL (ref 3.80–5.20)
RDW: 14.1 % (ref 11.5–14.5)
WBC: 2.5 10*3/uL — AB (ref 3.6–11.0)

## 2015-08-07 LAB — BASIC METABOLIC PANEL
Anion gap: 7 (ref 5–15)
BUN: 5 mg/dL — AB (ref 6–20)
CALCIUM: 8.4 mg/dL — AB (ref 8.9–10.3)
CO2: 29 mmol/L (ref 22–32)
CREATININE: 0.71 mg/dL (ref 0.44–1.00)
Chloride: 103 mmol/L (ref 101–111)
GFR calc Af Amer: >60 mL/min (ref >60)
GLUCOSE: 86 mg/dL (ref 65–99)
Potassium: 3 mmol/L — ABNORMAL LOW (ref 3.5–5.1)
SODIUM: 139 mmol/L (ref 135–145)

## 2015-08-07 LAB — MAGNESIUM: MAGNESIUM: 1.7 mg/dL (ref 1.7–2.4)

## 2015-08-07 LAB — C-REACTIVE PROTEIN: CRP: <0.5 mg/dL (ref <1.0)

## 2015-08-07 LAB — CREATININE, SERUM: CREATININE: 0.74 mg/dL (ref 0.44–1.00)

## 2015-08-07 MED ORDER — POTASSIUM CHLORIDE CRYS ER 20 MEQ PO TBCR
20.0000 meq | EXTENDED_RELEASE_TABLET | Freq: Once | ORAL | Status: AC
  Administered 2015-08-07: 20 meq via ORAL
  Filled 2015-08-07: qty 1

## 2015-08-07 MED ORDER — HYDROCHLOROTHIAZIDE 25 MG PO TABS: 25.0000 mg | ORAL_TABLET | Freq: Every day | ORAL | Status: DC

## 2015-08-07 MED ORDER — OXCARBAZEPINE 150 MG PO TABS: 150.0000 mg | ORAL_TABLET | Freq: Two times a day (BID) | ORAL | Status: DC

## 2015-08-07 MED ORDER — ACETAMINOPHEN 650 MG RE SUPP: 650.0000 mg | Freq: Four times a day (QID) | RECTAL | Status: DC | PRN

## 2015-08-07 MED ORDER — ACETAMINOPHEN 325 MG PO TABS: 650.0000 mg | ORAL_TABLET | Freq: Four times a day (QID) | ORAL | Status: DC | PRN

## 2015-08-07 MED ORDER — PANTOPRAZOLE SODIUM 40 MG PO TBEC
40.0000 mg | DELAYED_RELEASE_TABLET | Freq: Every day | ORAL | Status: DC
  Administered 2015-08-07: 10:00:00 40 mg via ORAL
  Filled 2015-08-07 (×2): qty 1

## 2015-08-07 MED ORDER — ONDANSETRON HCL 4 MG/2ML IJ SOLN
4.0000 mg | Freq: Four times a day (QID) | INTRAMUSCULAR | Status: DC | PRN
  Administered 2015-08-07: 4 mg via INTRAVENOUS
  Filled 2015-08-07: qty 2

## 2015-08-07 MED ORDER — SODIUM CHLORIDE 0.9% FLUSH
3.0000 mL | Freq: Two times a day (BID) | INTRAVENOUS | Status: DC
  Administered 2015-08-07 (×3): 3 mL via INTRAVENOUS

## 2015-08-07 MED ORDER — SODIUM CHLORIDE 0.9 % IV SOLN: 250.0000 mL | INTRAVENOUS | Status: DC | PRN

## 2015-08-07 MED ORDER — DIAZEPAM 5 MG PO TABS: 5.0000 mg | ORAL_TABLET | Freq: Three times a day (TID) | ORAL | Status: DC | PRN

## 2015-08-07 MED ORDER — GABAPENTIN 300 MG PO CAPS: 300.0000 mg | ORAL_CAPSULE | Freq: Two times a day (BID) | ORAL | Status: DC

## 2015-08-07 MED ORDER — BUSPIRONE HCL 10 MG PO TABS: 10.0000 mg | ORAL_TABLET | Freq: Three times a day (TID) | ORAL | Status: DC

## 2015-08-07 MED ORDER — SODIUM CHLORIDE 0.9% FLUSH: 3.0000 mL | INTRAVENOUS | Status: DC | PRN

## 2015-08-07 MED ORDER — LISINOPRIL 10 MG PO TABS: 10.0000 mg | ORAL_TABLET | Freq: Every day | ORAL | Status: DC

## 2015-08-07 MED ORDER — OXCARBAZEPINE 300 MG PO TABS
300.0000 mg | ORAL_TABLET | Freq: Two times a day (BID) | ORAL | Status: DC
  Administered 2015-08-07: 20:00:00 300 mg via ORAL
  Filled 2015-08-07 (×3): qty 1

## 2015-08-07 MED ORDER — CARBAMAZEPINE 200 MG PO TABS: 200.0000 mg | ORAL_TABLET | Freq: Two times a day (BID) | ORAL | Status: DC

## 2015-08-07 MED ORDER — PREDNISONE 10 MG PO TABS: 10.0000 mg | ORAL_TABLET | Freq: Every day | ORAL | Status: DC

## 2015-08-07 MED ORDER — BACLOFEN 10 MG PO TABS
20.0000 mg | ORAL_TABLET | Freq: Three times a day (TID) | ORAL | Status: DC
  Administered 2015-08-07 (×3): 20 mg via ORAL
  Filled 2015-08-07 (×4): qty 2

## 2015-08-07 MED ORDER — ONDANSETRON HCL 4 MG PO TABS
4.0000 mg | ORAL_TABLET | Freq: Four times a day (QID) | ORAL | Status: DC | PRN
  Administered 2015-08-07: 4 mg via ORAL
  Filled 2015-08-07: qty 1

## 2015-08-07 MED ORDER — HYDROMORPHONE HCL 1 MG/ML IJ SOLN
1.0000 mg | INTRAMUSCULAR | Status: DC | PRN
  Administered 2015-08-07 – 2015-08-08 (×7): 1 mg via INTRAVENOUS
  Filled 2015-08-07 (×7): qty 1

## 2015-08-07 MED ORDER — VENLAFAXINE HCL 75 MG PO TABS: 150.0000 mg | ORAL_TABLET | Freq: Every day | ORAL | Status: DC

## 2015-08-07 MED ORDER — HYDROCODONE-ACETAMINOPHEN 5-325 MG PO TABS
1.0000 | ORAL_TABLET | ORAL | Status: DC | PRN
  Administered 2015-08-07: 04:00:00 1 via ORAL
  Filled 2015-08-07: qty 1

## 2015-08-07 MED ORDER — HYDROXYCHLOROQUINE SULFATE 200 MG PO TABS
200.0000 mg | ORAL_TABLET | Freq: Two times a day (BID) | ORAL | Status: DC
  Administered 2015-08-07 (×2): 200 mg via ORAL
  Filled 2015-08-07 (×4): qty 1

## 2015-08-07 MED ORDER — OXYCODONE HCL 5 MG PO TABS
5.0000 mg | ORAL_TABLET | Freq: Two times a day (BID) | ORAL | Status: DC | PRN
  Administered 2015-08-07: 22:00:00 5 mg via ORAL
  Filled 2015-08-07: qty 1

## 2015-08-07 MED ORDER — ENOXAPARIN SODIUM 40 MG/0.4ML ~~LOC~~ SOLN
40.0000 mg | Freq: Every day | SUBCUTANEOUS | Status: DC
  Administered 2015-08-07 (×2): 40 mg via SUBCUTANEOUS
  Filled 2015-08-07 (×2): qty 0.4

## 2015-08-07 NOTE — Progress Notes (Signed)
[facility] [place] NAME: Ana [name]    MR#:  [id number]  DATE OF BIRTH:  1966/04/14  SUBJECTIVE: Waited for left-sided neck pain radiating down to the left hand and numbness of the left hand. Patient also has nausea and vomiting. Getting IV pain medication.   CHIEF COMPLAINT:   Chief Complaint  Patient presents with  . Neck Pain  . Headache    REVIEW OF SYSTEMS:    Review of Systems  Constitutional: Negative for fever and chills.  HENT: Negative for hearing loss.   Eyes: Negative for blurred vision, double vision and photophobia.  Respiratory: Negative for cough, hemoptysis and shortness of breath.   Cardiovascular: Negative for palpitations, orthopnea and leg swelling.  Gastrointestinal: Negative for vomiting, abdominal pain and diarrhea.  Genitourinary: Negative for dysuria and urgency.  Musculoskeletal: Positive for myalgias and neck pain.       Pain on neck side,,going down on left hand,  Skin: Negative for rash.  Neurological: Negative for dizziness, focal weakness, seizures, weakness and headaches.  Psychiatric/Behavioral: Negative for memory loss. The patient does not have insomnia.     Nutrition:  Tolerating Diet: Tolerating PT:      DRUG ALLERGIES:   Allergies  Allergen Reactions  . Mirtazapine Other (See Comments)    hallucinations    VITALS:  Blood pressure 154/89, pulse 68, temperature 98.3 F (36.8 C), temperature source Oral, resp. rate 20, height 5\' 4"  (1.626 m), weight 79.833 kg (176 lb), SpO2 99 %.  PHYSICAL EXAMINATION:   Physical Exam  GENERAL:  50 y.o.-year-old patient lying in the bed with no acute distress.  EYES: Pupils equal, round, reactive to light and accommodation. No scleral icterus. Extraocular muscles intact.  HEENT: Head atraumatic, normocephalic. Oropharynx and nasopharynx clear.  NECK:  Supple, no jugular venous distention. No thyroid enlargement, no tenderness.  LUNGS:  Normal breath sounds bilaterally, no wheezing, rales,rhonchi or crepitation. No use of accessory muscles of respiration.  CARDIOVASCULAR: S1, S2 normal. No murmurs, rubs, or gallops.  ABDOMEN: Soft, nontender, nondistended. Bowel sounds present. No organomegaly or mass.  EXTREMITIES: No pedal edema, cyanosis, or clubbing.  NEUROLOGIC: Cranial nerves II through XII are intact. Muscle strength 5/5 in all extremities. Sensation intact. Gait not checked. Unable to do full neurological examination (because she has a lot of pain in the left hand. PSYCHIATRIC: The patient is alert and oriented x 3.  SKIN: No obvious rash, lesion, or ulcer.    LABORATORY PANEL:   CBC  Recent Labs Lab 08/07/15 0425  WBC 2.5*  HGB 12.3  HCT 36.9  PLT 173   ------------------------------------------------------------------------------------------------------------------  Chemistries   Recent Labs Lab 08/06/15 1856 08/07/15 0425  NA 139 139  K 3.5 3.0*  CL 105 103  CO2 25 29  GLUCOSE 86 86  BUN 7 5*  CREATININE 0.66 0.71  0.74  CALCIUM 8.7* 8.4*  AST 17  --   ALT 16  --   ALKPHOS 52  --   BILITOT 0.4  --    ------------------------------------------------------------------------------------------------------------------  Cardiac Enzymes  Recent Labs Lab 08/06/15 1856  TROPONINI <0.03   ------------------------------------------------------------------------------------------------------------------  RADIOLOGY:  Mr Jodene Nam Head Wo Contrast  08/06/2015  CLINICAL DATA:  Neck pain shooting up neck, similar symptoms last week. Chest tightness and LEFT arm swelling. History of lupus. EXAM: MRI HEAD WITHOUT AND WITH CONTRAST MRA HEAD WITHOUT CONTRAST TECHNIQUE: Multiplanar, multiecho pulse sequences of the brain and surrounding structures were obtained without and  with intravenous contrast. Angiographic images of the head were obtained using MRA technique without contrast. CONTRAST:  29mL MULTIHANCE  GADOBENATE DIMEGLUMINE 529 MG/ML IV SOLN COMPARISON:  CT head August 03, 2015 FINDINGS: MRI HEAD FINDINGS Severe susceptibility artifact attributed to hair clips seen on prior CT localizer. The ventricles and sulci are normal for patient's age. No abnormal parenchymal signal, mass lesions, mass effect. No abnormal intracranial enhancement the lateral cerebrum obscured by susceptibility artifact. No reduced diffusion to suggest acute ischemia though artifact distortion severely limits assessment. 2 mm LEFT basal ganglia perivascular space. No susceptibility artifact to suggest hemorrhage markedly limited by artifact . No abnormal extra-axial fluid collections. No extra-axial masses though, contrast enhanced sequences would be more sensitive. Normal major intracranial vascular flow voids seen at the skull base. Ocular globes and orbital contents are unremarkable though not tailored for evaluation. No abnormal sellar expansion. No suspicious calvarial bone marrow signal. Craniocervical junction maintained. Visualized paranasal sinuses and mastoid air cells are well-aerated. MRA HEAD FINDINGS Limited by motion and susceptibility artifact attributed air clips, resulted in duplicated appearance of the vessels. Anterior circulation: Normal flow related enhancement of the included cervical, petrous, cavernous and supraclinoid internal carotid arteries. Patent anterior communicating artery. Normal flow related enhancement of the anterior and middle cerebral arteries, including distal segments. No large vessel occlusion, high-grade stenosis, abnormal luminal irregularity, aneurysm. Posterior circulation: Codominant vertebral arteries. Basilar artery is patent, with normal flow related enhancement of the main branch vessels. Normal flow related enhancement of the posterior cerebral arteries. No large vessel occlusion, high-grade stenosis, abnormal luminal irregularity, aneurysm. IMPRESSION: MRI HEAD: Hair artifact, otherwise  negative MRI of the brain with and without contrast. MRA HEAD: Hair artifact, otherwise negative MRA of the brain. Electronically Signed   By: Elon Alas M.D.   On: 08/06/2015 22:13   Mr Jeri Cos [id number] Contrast  08/06/2015  CLINICAL DATA:  Neck pain shooting up neck, similar symptoms last week. Chest tightness and LEFT arm swelling. History of lupus. EXAM: MRI HEAD WITHOUT AND WITH CONTRAST MRA HEAD WITHOUT CONTRAST TECHNIQUE: Multiplanar, multiecho pulse sequences of the brain and surrounding structures were obtained without and with intravenous contrast. Angiographic images of the head were obtained using MRA technique without contrast. CONTRAST:  12mL MULTIHANCE GADOBENATE DIMEGLUMINE 529 MG/ML IV SOLN COMPARISON:  CT head August 03, 2015 FINDINGS: MRI HEAD FINDINGS Severe susceptibility artifact attributed to hair clips seen on prior CT localizer. The ventricles and sulci are normal for patient's age. No abnormal parenchymal signal, mass lesions, mass effect. No abnormal intracranial enhancement the lateral cerebrum obscured by susceptibility artifact. No reduced diffusion to suggest acute ischemia though artifact distortion severely limits assessment. 2 mm LEFT basal ganglia perivascular space. No susceptibility artifact to suggest hemorrhage markedly limited by artifact . No abnormal extra-axial fluid collections. No extra-axial masses though, contrast enhanced sequences would be more sensitive. Normal major intracranial vascular flow voids seen at the skull base. Ocular globes and orbital contents are unremarkable though not tailored for evaluation. No abnormal sellar expansion. No suspicious calvarial bone marrow signal. Craniocervical junction maintained. Visualized paranasal sinuses and mastoid air cells are well-aerated. MRA HEAD FINDINGS Limited by motion and susceptibility artifact attributed air clips, resulted in duplicated appearance of the vessels. Anterior circulation: Normal flow related  enhancement of the included cervical, petrous, cavernous and supraclinoid internal carotid arteries. Patent anterior communicating artery. Normal flow related enhancement of the anterior and middle cerebral arteries, including distal segments. No large vessel occlusion, high-grade stenosis, abnormal luminal irregularity,  aneurysm. Posterior circulation: Codominant vertebral arteries. Basilar artery is patent, with normal flow related enhancement of the main branch vessels. Normal flow related enhancement of the posterior cerebral arteries. No large vessel occlusion, high-grade stenosis, abnormal luminal irregularity, aneurysm. IMPRESSION: MRI HEAD: Hair artifact, otherwise negative MRI of the brain with and without contrast. MRA HEAD: Hair artifact, otherwise negative MRA of the brain. Electronically Signed   By: Elon Alas M.D.   On: 08/06/2015 22:13     ASSESSMENT AND PLAN:   Principal Problem:   Left arm weakness Active Problems:   Lupus (HCC)   1.left arm weakness and left sided  Neck pain.likel y cervical neuralgia;MRI Of brachial plexus,neurology consult; Pt can be discharged  Home if okay with neuro and work up with neuro as an out pt  2. Systemic lupus erythematosus: controlled continue Plaquenil. 3. Hypokalemia;due to poor by mouth intake and nausea vomiting: Replace the potassium., Recheck.   All the records are reviewed and case discussed with Care Management/Social Workerr. Management plans discussed with the patient, family and they are in agreement.  CODE STATUS: Full  TOTAL TIME TAKING CARE OF THIS PATIENT: 20 minutes.   POSSIBLE D/C IN  1-2DAYS, DEPENDING ON CLINICAL CONDITION.   Epifanio Lesches M.D on 08/07/2015 at 10:19 AM  Between 7am to 6pm - Pager - [phone]  After 6pm go to [email or web address] - password [email or web address] [facility]  Office  [phone]  CC: Primary care physician; [facility]

## 2015-08-07 NOTE — Clinical Social Work Note (Signed)
Clinical Social Work Assessment  Patient Details  Name: Ana [name] MRN: [id number] Date of Birth: September 29, 1965  Date of referral:  08/07/15               Reason for consult:  Transportation                Permission sought to share information with:    Permission granted to share information::     Name::        Agency::     Relationship::     Contact Information:     Housing/Transportation Living arrangements for the past 2 months:  Apartment Source of Information:  Patient Patient Interpreter Needed:  None Criminal Activity/Legal Involvement Pertinent to Current Situation/Hospitalization:    Significant Relationships:  Adult Children, Parents Lives with:  Self Do you feel safe going back to the place where you live?  Yes Need for family participation in patient care:  No (Coment)  Care giving concerns:  With transportation to her appointments   Social Worker assessment / plan:  Holiday representative (CSW) consult for transportation concerns.   Patient was alert, oriented and engaged in conversation with CSW.  CSW introduced self and explained role of CSW department Patient currently lives alone her support system are friends and family that assist with taking her to her appointments.  Patient states they may not always be able to take her so she wanted to find other transportation.    CSW reviewed local transportation resources and provided patient with a list and phone numbers.    Employment status:  Disabled (Comment on whether or not currently receiving Disability) Insurance information:  Medicare, Medicaid In [place] PT Recommendations:  Not assessed at this time Information / Referral to community resources:  Other (Comment Required) (community resources for Transportation)  Patient/Family's Response to care:  Patient was appreciative of talking with CSW and stated she would make calls to [place] transportation while in the hospital.  Patient/Family's Understanding of and  Emotional Response to Diagnosis, Current Treatment, and Prognosis:  Patient is pleasant and understands that she is under continued medical work up at this time.    Emotional Assessment Appearance:  Appears stated age Attitude/Demeanor/Rapport:    Affect (typically observed):  Accepting, Adaptable, Calm, Pleasant Orientation:  Oriented to Self, Oriented to Place, Oriented to  Time, Oriented to Situation Alcohol / Substance use:    Psych involvement (Current and /or in the community):  No (Comment)  Discharge Needs  Concerns to be addressed:  Other (Comment Required (transportation to appointments) Readmission within the last 30 days:  No Current discharge risk:  Lack of support system, Chronically ill Barriers to Discharge:  No Barriers Identified, Continued Medical Work up  [place] signing off no additional services needed at this time Tery Sanfilippo 08/07/2015, 1:01 PM

## 2015-08-07 NOTE — H&P (Signed)
[facility] [place] NAME: Ana [name]    MR#:  [id number]  DATE OF BIRTH:  November 08, 1965  DATE OF ADMISSION:  08/06/2015  PRIMARY CARE PHYSICIAN: [facility]   REQUESTING/REFERRING PHYSICIAN:   CHIEF COMPLAINT:   Chief Complaint  Patient presents with  . Neck Pain  . Headache    HISTORY OF PRESENT ILLNESS: Ana [name]  is a 50 y.o. female with a known history of lupus erythematosus, hypertension, fibromyalgia presented to the emergency room with pain in the left side of the neck radiating down the left arm since one week. She said it started in the left side of the neck. Patient was seen in the emergency room last Thursday and was discharged home on oral tramadol. She had an episode of nausea and vomiting and generalized soreness. Patient was worked up with MRI brain and cervical spine night and tests were normal. The pain in the left side of the neck radiates down the left arm is aching in nature 7 out of 10. Generalized soreness all over the chest and back. Telemetry neurology consult was done in the emergency room who recommended for possible MRI brachial plexus and the inhouse neurology consult. No fever or chills. No history of any slurred speech. No history of bowel or bladder incontinence.  PAST MEDICAL HISTORY:   Past Medical History  Diagnosis Date  . Lupus ([place]) 1990  . Fibromyalgia   . Hypertension     PAST SURGICAL HISTORY: Past Surgical History  Procedure Laterality Date  . Cholecystectomy    . Right ankle surgery    . Cesarean section    . Abdominal hysterectomy      SOCIAL HISTORY:  Social History  Substance Use Topics  . Smoking status: Never Smoker   . Smokeless tobacco: Not on file  . Alcohol Use: 1.2 oz/week    2 Cans of beer per week    FAMILY HISTORY:  Family History  Problem Relation Age of Onset  . Testicular cancer Father   . Diabetes Mother     DRUG ALLERGIES:  Allergies   Allergen Reactions  . Mirtazapine Other (See Comments)    hallucinations    REVIEW OF SYSTEMS:   CONSTITUTIONAL: No fever, has weakness.  EYES: No blurred or double vision.  EARS, NOSE, AND THROAT: No tinnitus or ear pain.  RESPIRATORY: No cough, shortness of breath, wheezing or hemoptysis.  CARDIOVASCULAR: generalized chest soreness, no orthopnea, edema.  GASTROINTESTINAL: had an episode of nausea, vomiting,no diarrhea or abdominal pain.  GENITOURINARY: No dysuria, hematuria.  ENDOCRINE: No polyuria, nocturia,  HEMATOLOGY: No anemia, easy bruising or bleeding SKIN: No rash or lesion. MUSCULOSKELETAL: No joint pain or arthritis.   NEUROLOGIC: No tingling, numbness, weakness.  PSYCHIATRY: No anxiety or depression.   MEDICATIONS AT HOME:  Prior to Admission medications   Medication Sig Start Date End Date Taking? Authorizing Provider  busPIRone (BUSPAR) 10 MG tablet Take 10 mg by mouth 3 (three) times daily.    Historical Provider, MD  diazepam (VALIUM) 5 MG tablet Take 1 tablet (5 mg total) by mouth every 8 (eight) hours as needed for anxiety. 04/24/15   Earleen Newport, MD  gabapentin (NEURONTIN) 300 MG capsule Take 300 mg by mouth 2 (two) times daily.    Historical Provider, MD  hydrochlorothiazide (HYDRODIURIL) 25 MG tablet Take 25 mg by mouth daily.    Historical Provider, MD  HYDROcodone-acetaminophen (NORCO/VICODIN) 5-325 MG per tablet Take  1 tablet by mouth every 4 (four) hours as needed for moderate pain. 02/22/15   Johnn Hai, PA-C  lisinopril (PRINIVIL,ZESTRIL) 10 MG tablet Take 10 mg by mouth daily.    Historical Provider, MD  omeprazole (PRILOSEC) 40 MG capsule Take 200 mg by mouth daily.    Historical Provider, MD  OXcarbazepine (TRILEPTAL) 150 MG tablet Take 150 mg by mouth 2 (two) times daily.    Historical Provider, MD  oxyCODONE (OXY IR/ROXICODONE) 5 MG immediate release tablet Take 5 mg by mouth every 12 (twelve) hours as needed for severe pain.     Historical Provider, MD  predniSONE (DELTASONE) 10 MG tablet Take 10 mg by mouth.    Historical Provider, MD  promethazine (PHENERGAN) 25 MG tablet Take 1 tablet (25 mg total) by mouth every 6 (six) hours as needed for nausea or vomiting. 04/24/15   Earleen Newport, MD  traMADol (ULTRAM) 50 MG tablet Take 1 tablet (50 mg total) by mouth every 6 (six) hours as needed. 08/03/15 08/02/16  Lavonia Drafts, MD  venlafaxine (EFFEXOR) 100 MG tablet Take 150 mg by mouth daily.    Historical Provider, MD      PHYSICAL EXAMINATION:   VITAL SIGNS: Blood pressure 169/107, pulse 71, temperature 98.7 F (37.1 C), temperature source Oral, resp. rate 15, height 5\' 4"  (1.626 m), weight 79.379 kg (175 lb), SpO2 93 %.  GENERAL:  50 y.o.-year-old patient lying in the bed with no acute distress.  EYES: Pupils equal, round, reactive to light and accommodation. No scleral icterus. Extraocular muscles intact.  HEENT: Head atraumatic, normocephalic. Oropharynx and nasopharynx clear.  NECK:  Supple, no jugular venous distention. No thyroid enlargement, mild tenderness over left side of neck. LUNGS: Normal breath sounds bilaterally, no wheezing, rales,rhonchi or crepitation. No use of accessory muscles of respiration.  CARDIOVASCULAR: S1, S2 normal. No murmurs, rubs, or gallops.  ABDOMEN: Soft, nontender, nondistended. Bowel sounds present. No organomegaly or mass.  EXTREMITIES: No pedal edema, cyanosis, or clubbing. Tenderness over left arm NEUROLOGIC: Cranial nerves II through XII are intact. Muscle strength 5/5 in all extremities. Sensation intact. Gait normal. PSYCHIATRIC: The patient is alert and oriented x 3.  SKIN: No obvious rash, lesion, or ulcer.   LABORATORY PANEL:   CBC  Recent Labs Lab 08/06/15 1856  WBC 3.1*  HGB 12.1  HCT 35.5  PLT 182  MCV 88.8  MCH 30.3  MCHC 34.1  RDW 14.2  LYMPHSABS 0.7*  MONOABS 0.4  EOSABS 0.2  BASOSABS 0.0    ------------------------------------------------------------------------------------------------------------------  Chemistries   Recent Labs Lab 08/06/15 1856  NA 139  K 3.5  CL 105  CO2 25  GLUCOSE 86  BUN 7  CREATININE 0.66  CALCIUM 8.7*  AST 17  ALT 16  ALKPHOS 52  BILITOT 0.4   ------------------------------------------------------------------------------------------------------------------ estimated creatinine clearance is 87.7 mL/min (by C-G formula based on Cr of 0.66). ------------------------------------------------------------------------------------------------------------------ No results for input(s): TSH, T4TOTAL, T3FREE, THYROIDAB in the last 72 hours.  Invalid input(s): FREET3   Coagulation profile No results for input(s): INR, PROTIME in the last 168 hours. ------------------------------------------------------------------------------------------------------------------- No results for input(s): DDIMER in the last 72 hours. -------------------------------------------------------------------------------------------------------------------  Cardiac Enzymes  Recent Labs Lab 08/06/15 1856  TROPONINI <0.03   ------------------------------------------------------------------------------------------------------------------ Invalid input(s): POCBNP  ---------------------------------------------------------------------------------------------------------------  Urinalysis    Component Value Date/Time   COLORURINE YELLOW* 08/06/2015 2010   COLORURINE Yellow 05/04/2014 0834   APPEARANCEUR HAZY* 08/06/2015 2010   APPEARANCEUR Cloudy 05/04/2014 0834   LABSPEC 1.024 08/06/2015 2010  LABSPEC 1.010 05/04/2014 0834   PHURINE 6.0 08/06/2015 2010   PHURINE 7.0 05/04/2014 [place] 08/06/2015 2010   GLUCOSEU Negative 05/04/2014 0834   HGBUR NEGATIVE 08/06/2015 2010   HGBUR Negative 05/04/2014 [place] 08/06/2015 2010    BILIRUBINUR Negative 05/04/2014 0834   KETONESUR 1+* 08/06/2015 2010   KETONESUR Negative 05/04/2014 [place] NEGATIVE 08/06/2015 2010   PROTEINUR Negative 05/04/2014 0834   NITRITE POSITIVE* 08/06/2015 2010   NITRITE Negative 05/04/2014 [place] 08/06/2015 2010   LEUKOCYTESUR 3+ 05/04/2014 0834     RADIOLOGY: Mr Jodene Nam Head Wo Contrast  08/06/2015  CLINICAL DATA:  Neck pain shooting up neck, similar symptoms last week. Chest tightness and LEFT arm swelling. History of lupus. EXAM: MRI HEAD WITHOUT AND WITH CONTRAST MRA HEAD WITHOUT CONTRAST TECHNIQUE: Multiplanar, multiecho pulse sequences of the brain and surrounding structures were obtained without and with intravenous contrast. Angiographic images of the head were obtained using MRA technique without contrast. CONTRAST:  73mL MULTIHANCE GADOBENATE DIMEGLUMINE 529 MG/ML IV SOLN COMPARISON:  CT head August 03, 2015 FINDINGS: MRI HEAD FINDINGS Severe susceptibility artifact attributed to hair clips seen on prior CT localizer. The ventricles and sulci are normal for patient's age. No abnormal parenchymal signal, mass lesions, mass effect. No abnormal intracranial enhancement the lateral cerebrum obscured by susceptibility artifact. No reduced diffusion to suggest acute ischemia though artifact distortion severely limits assessment. 2 mm LEFT basal ganglia perivascular space. No susceptibility artifact to suggest hemorrhage markedly limited by artifact . No abnormal extra-axial fluid collections. No extra-axial masses though, contrast enhanced sequences would be more sensitive. Normal major intracranial vascular flow voids seen at the skull base. Ocular globes and orbital contents are unremarkable though not tailored for evaluation. No abnormal sellar expansion. No suspicious calvarial bone marrow signal. Craniocervical junction maintained. Visualized paranasal sinuses and mastoid air cells are well-aerated. MRA HEAD FINDINGS  Limited by motion and susceptibility artifact attributed air clips, resulted in duplicated appearance of the vessels. Anterior circulation: Normal flow related enhancement of the included cervical, petrous, cavernous and supraclinoid internal carotid arteries. Patent anterior communicating artery. Normal flow related enhancement of the anterior and middle cerebral arteries, including distal segments. No large vessel occlusion, high-grade stenosis, abnormal luminal irregularity, aneurysm. Posterior circulation: Codominant vertebral arteries. Basilar artery is patent, with normal flow related enhancement of the main branch vessels. Normal flow related enhancement of the posterior cerebral arteries. No large vessel occlusion, high-grade stenosis, abnormal luminal irregularity, aneurysm. IMPRESSION: MRI HEAD: Hair artifact, otherwise negative MRI of the brain with and without contrast. MRA HEAD: Hair artifact, otherwise negative MRA of the brain. Electronically Signed   By: Elon Alas M.D.   On: 08/06/2015 22:13   Mr Jeri Cos [id number] Contrast  08/06/2015  CLINICAL DATA:  Neck pain shooting up neck, similar symptoms last week. Chest tightness and LEFT arm swelling. History of lupus. EXAM: MRI HEAD WITHOUT AND WITH CONTRAST MRA HEAD WITHOUT CONTRAST TECHNIQUE: Multiplanar, multiecho pulse sequences of the brain and surrounding structures were obtained without and with intravenous contrast. Angiographic images of the head were obtained using MRA technique without contrast. CONTRAST:  26mL MULTIHANCE GADOBENATE DIMEGLUMINE 529 MG/ML IV SOLN COMPARISON:  CT head August 03, 2015 FINDINGS: MRI HEAD FINDINGS Severe susceptibility artifact attributed to hair clips seen on prior CT localizer. The ventricles and sulci are normal for patient's age. No abnormal parenchymal signal, mass lesions, mass effect. No abnormal intracranial enhancement the lateral cerebrum  obscured by susceptibility artifact. No reduced diffusion to  suggest acute ischemia though artifact distortion severely limits assessment. 2 mm LEFT basal ganglia perivascular space. No susceptibility artifact to suggest hemorrhage markedly limited by artifact . No abnormal extra-axial fluid collections. No extra-axial masses though, contrast enhanced sequences would be more sensitive. Normal major intracranial vascular flow voids seen at the skull base. Ocular globes and orbital contents are unremarkable though not tailored for evaluation. No abnormal sellar expansion. No suspicious calvarial bone marrow signal. Craniocervical junction maintained. Visualized paranasal sinuses and mastoid air cells are well-aerated. MRA HEAD FINDINGS Limited by motion and susceptibility artifact attributed air clips, resulted in duplicated appearance of the vessels. Anterior circulation: Normal flow related enhancement of the included cervical, petrous, cavernous and supraclinoid internal carotid arteries. Patent anterior communicating artery. Normal flow related enhancement of the anterior and middle cerebral arteries, including distal segments. No large vessel occlusion, high-grade stenosis, abnormal luminal irregularity, aneurysm. Posterior circulation: Codominant vertebral arteries. Basilar artery is patent, with normal flow related enhancement of the main branch vessels. Normal flow related enhancement of the posterior cerebral arteries. No large vessel occlusion, high-grade stenosis, abnormal luminal irregularity, aneurysm. IMPRESSION: MRI HEAD: Hair artifact, otherwise negative MRI of the brain with and without contrast. MRA HEAD: Hair artifact, otherwise negative MRA of the brain. Electronically Signed   By: Elon Alas M.D.   On: 08/06/2015 22:13    EKG: Orders placed or performed during the hospital encounter of 08/06/15  . EKG 12-Lead  . EKG 12-Lead  . ED EKG  . ED EKG    IMPRESSION AND PLAN: 50 year old female patient with history of lupus erythematosus presented  to the emergency room with left neck pain and left arm weakness and generalized body ache. Admitting diagnosis 1. Left arm weakness 2. Intractable neck pain 3. Hypokalemia 4. Stomach lupus erythematosus 5. Hypertension Treatment plan Admit patient to medical floor observation bed Neurology consultation Replace potassium Pain management Supportive care.  All the records are reviewed and case discussed with ED provider. Management plans discussed with the patient, family and they are in agreement.  CODE STATUS:FULL Code Status History    This patient does not have a recorded code status. Please follow your organizational policy for patients in this situation.       TOTAL TIME TAKING CARE OF THIS PATIENT: 50 minutes.    Saundra Shelling M.D on 08/07/2015 at 12:56 AM  Between 7am to 6pm - Pager - [phone]  After 6pm go to [email or web address] - password [email or web address] [place] [facility]  Office  [phone]  CC: Primary care physician; [facility]

## 2015-08-07 NOTE — ED Notes (Signed)
Report attempted x 1

## 2015-08-07 NOTE — Progress Notes (Addendum)
MEDICATION RELATED CONSULT NOTE - INITIAL   Pharmacy Consult for Electrolyte Management Indication: hypokalemia  Allergies  Allergen Reactions  . Mirtazapine Other (See Comments)    hallucinations    Patient Measurements: Height: 5\' 4"  (162.6 cm) Weight: 176 lb (79.833 kg) IBW/kg (Calculated) : 54.7   Vital Signs: Temp: 98.3 F (36.8 C) (03/03 0239) Temp Source: Oral (03/03 0239) BP: 154/89 mmHg (03/03 0545) Pulse Rate: 68 (03/03 0545) Intake/Output from previous day:   Intake/Output from this shift: Total I/O In: 240 [P.O.:240] Out: 100 [Urine:100]  Labs:  Recent Labs  08/06/15 1856 08/07/15 0425  WBC 3.1* 2.5*  HGB 12.1 12.3  HCT 35.5 36.9  PLT 182 173  CREATININE 0.66 0.71  0.74  ALBUMIN 3.8  --   PROT 7.4  --   AST 17  --   ALT 16  --   ALKPHOS 52  --   BILITOT 0.4  --    Estimated Creatinine Clearance: 87.8 mL/min (by C-G formula based on Cr of 0.74).   Microbiology: No results found for this or any previous visit (from the past 720 hour(s)).  Medical History: Past Medical History  Diagnosis Date  . Lupus ([place]) 1990  . Fibromyalgia   . Hypertension     Medications:  Scheduled:  . baclofen  20 mg Oral TID  . enoxaparin (LOVENOX) injection  40 mg Subcutaneous QHS  . hydroxychloroquine  200 mg Oral BID  . OXcarbazepine  300 mg Oral BID  . pantoprazole  40 mg Oral Daily  . potassium chloride  20 mEq Oral Once  . sodium chloride flush  3 mL Intravenous Q12H    Assessment: Patient is a 50 yo female admitted for left arm weakness.    K: 3.0, Mag: 1.7  Patient received potassium chloride 20 meq po once.    Goal of Therapy:  K: 3.5-5.1 Mag: 1.7-2.7  Plan:  Will order an additional dose of potassium chloride 20 meq po once to make a total daily dose of 40 meq for today.    Will follow up with BMP/mag in AM.    Lalaine Overstreet G 08/07/2015,2:25 PM

## 2015-08-07 NOTE — Consult Note (Signed)
Reason for Consult:Left arm weakness and pain Referring Physician: Vianne Bulls  CC: Left arm weakness and pain  HPI: Ana [name] is an 50 y.o. female that reports that on Thursday of last week she began to have pain in the occipital region on the left.  Over the course of the week this pain began to move down to involve her neck, shoulder area and left arm and chest.  She also has some intermittent numbness in this area.  She feels she is weak in the arm as well and that it is out of proportion to the pain.  The pain is described as sharp and achy.  At times her hand will claw.    Past Medical History  Diagnosis Date  . Lupus ([place]) 1990  . Fibromyalgia   . Hypertension     Past Surgical History  Procedure Laterality Date  . Cholecystectomy    . Right ankle surgery    . Cesarean section    . Abdominal hysterectomy      Family History  Problem Relation Age of Onset  . Testicular cancer Father   . Diabetes Mother     Social History:  reports that she has never smoked. She does not have any smokeless tobacco history on file. She reports that she drinks about 1.2 oz of alcohol per week. She reports that she does not use illicit drugs.  Allergies  Allergen Reactions  . Mirtazapine Other (See Comments)    hallucinations    Medications:  I have reviewed the patient's current medications. Prior to Admission:  Prescriptions prior to admission  Medication Sig Dispense Refill Last Dose  . baclofen (LIORESAL) 20 MG tablet Take 20 mg by mouth 3 (three) times daily.   08/06/2015 at 800  . busPIRone (BUSPAR) 10 MG tablet Take 10 mg by mouth 3 (three) times daily. Reported on 08/07/2015   Not Taking at Unknown time  . diazepam (VALIUM) 5 MG tablet Take 1 tablet (5 mg total) by mouth every 8 (eight) hours as needed for anxiety. (Patient not taking: Reported on 08/07/2015) 20 tablet 0 Not Taking at Unknown time  . gabapentin (NEURONTIN) 300 MG capsule Take 300 mg by mouth 2 (two) times daily.    More than a month at Unknown time  . hydrochlorothiazide (HYDRODIURIL) 25 MG tablet Take 25 mg by mouth daily. Reported on 08/07/2015   Not Taking at Unknown time  . HYDROcodone-acetaminophen (NORCO/VICODIN) 5-325 MG per tablet Take 1 tablet by mouth every 4 (four) hours as needed for moderate pain. (Patient not taking: Reported on 08/07/2015) 20 tablet 0 Not Taking at Unknown time  . lisinopril (PRINIVIL,ZESTRIL) 10 MG tablet Take 10 mg by mouth daily. Reported on 08/07/2015   Not Taking at Unknown time  . omeprazole (PRILOSEC) 40 MG capsule Take 200 mg by mouth daily. Reported on 08/07/2015   Not Taking at Unknown time  . OXcarbazepine (TRILEPTAL) 150 MG tablet Take 150 mg by mouth 2 (two) times daily. Reported on 08/07/2015   Not Taking at Unknown time  . oxyCODONE (OXY IR/ROXICODONE) 5 MG immediate release tablet Take 5 mg by mouth every 12 (twelve) hours as needed for severe pain. Reported on 08/07/2015   Not Taking at Unknown time  . predniSONE (DELTASONE) 10 MG tablet Take 10 mg by mouth. Reported on 08/07/2015   Not Taking at Unknown time  . promethazine (PHENERGAN) 25 MG tablet Take 1 tablet (25 mg total) by mouth every 6 (six) hours as needed for  nausea or vomiting. (Patient not taking: Reported on 08/07/2015) 20 tablet 0 Not Taking at Unknown time  . traMADol (ULTRAM) 50 MG tablet Take 1 tablet (50 mg total) by mouth every 6 (six) hours as needed. (Patient not taking: Reported on 08/07/2015) 20 tablet 0 Not Taking at Unknown time  . venlafaxine (EFFEXOR) 100 MG tablet Take 150 mg by mouth daily. Reported on 08/07/2015   Not Taking at Unknown time   Scheduled: . baclofen  20 mg Oral TID  . carbamazepine  200 mg Oral BID  . enoxaparin (LOVENOX) injection  40 mg Subcutaneous QHS  . hydroxychloroquine  200 mg Oral BID  . pantoprazole  40 mg Oral Daily  . sodium chloride flush  3 mL Intravenous Q12H    ROS: History obtained from the patient  General ROS: negative for - chills, fatigue, fever, night sweats,  weight gain or weight loss Psychological ROS: negative for - behavioral disorder, hallucinations, memory difficulties, mood swings or suicidal ideation Ophthalmic ROS: negative for - blurry vision, double vision, eye pain or loss of vision ENT ROS: negative for - epistaxis, nasal discharge, oral lesions, sore throat, tinnitus or vertigo Allergy and Immunology ROS: negative for - hives or itchy/watery eyes Hematological and Lymphatic ROS: negative for - bleeding problems, bruising or swollen lymph nodes Endocrine ROS: negative for - galactorrhea, hair pattern changes, polydipsia/polyuria or temperature intolerance Respiratory ROS: negative for - cough, hemoptysis, shortness of breath or wheezing Cardiovascular ROS: chest pain Gastrointestinal ROS: negative for - abdominal pain, diarrhea, hematemesis, nausea/vomiting or stool incontinence Genito-Urinary ROS: negative for - dysuria, hematuria, incontinence or urinary frequency/urgency Musculoskeletal ROS: negative for - joint swelling or muscular weakness Neurological ROS: as noted in HPI Dermatological ROS: negative for rash and skin lesion changes  Physical Examination: Blood pressure 154/89, pulse 68, temperature 98.3 F (36.8 C), temperature source Oral, resp. rate 20, height 5\' 4"  (1.626 m), weight 79.833 kg (176 lb), SpO2 99 %.  HEENT-  Normocephalic, no lesions, without obvious abnormality.  Normal external eye and conjunctiva.  Normal TM's bilaterally.  Normal auditory canals and external ears. Normal external nose, mucus membranes and septum.  Normal pharynx. Cardiovascular- S1, S2 normal, pulses palpable throughout   Lungs- chest clear, no wheezing, rales, normal symmetric air entry Abdomen- soft, non-tender; bowel sounds normal; no masses,  no organomegaly Extremities- mild LUE swelling Lymph-no adenopathy palpable Musculoskeletal-pain on palpation of the arm, neck and sternum Skin-warm and dry, no hyperpigmentation, vitiligo, or  suspicious lesions  Neurological Examination Mental Status: Alert, oriented, thought content appropriate.  Speech fluent without evidence of aphasia.  Able to follow 3 step commands without difficulty. Cranial Nerves: II: Discs flat bilaterally; Visual Krahl grossly normal, pupils equal, round, reactive to light and accommodation III,IV, VI: ptosis not present, extra-ocular motions intact bilaterally V,VII: smile symmetric, facial light touch sensation normal bilaterally VIII: hearing normal bilaterally IX,X: gag reflex present XI: bilateral shoulder shrug XII: midline tongue extension Motor: Right : Upper extremity   5/5    Left:     Upper extremity   1-2/5  Lower extremity   5/5     Lower extremity   5/5 Tone and bulk:normal tone throughout; no atrophy noted Sensory: Pinprick and light touch intact throughout, bilaterally Deep Tendon Reflexes: 2+ in the upper extremities Plantars: Right: downgoing   Left: downgoing Cerebellar: Normal finger-to-nose in the RUE and normal heel-to-shin testing in the lower extremities Gait: not tested due to pain    Laboratory Studies:   Basic  Metabolic Panel:  Recent Labs Lab 08/06/15 1856 08/07/15 0425  NA 139 139  K 3.5 3.0*  CL 105 103  CO2 25 29  GLUCOSE 86 86  BUN 7 5*  CREATININE 0.66 0.71  0.74  CALCIUM 8.7* 8.4*    Liver Function Tests:  Recent Labs Lab 08/06/15 1856  AST 17  ALT 16  ALKPHOS 52  BILITOT 0.4  PROT 7.4  ALBUMIN 3.8   No results for input(s): LIPASE, AMYLASE in the last 168 hours. No results for input(s): AMMONIA in the last 168 hours.  CBC:  Recent Labs Lab 08/06/15 1856 08/07/15 0425  WBC 3.1* 2.5*  NEUTROABS 1.8  --   HGB 12.1 12.3  HCT 35.5 36.9  MCV 88.8 88.5  PLT 182 173    Cardiac Enzymes:  Recent Labs Lab 08/06/15 1856  TROPONINI <0.03    BNP: Invalid input(s): POCBNP  CBG: No results for input(s): GLUCAP in the last 168 hours.  Microbiology: Results for orders  placed or performed in visit on 02/24/12  Culture, blood (single)     Status: None   Collection Time: 02/25/12  9:25 AM  Result Value Ref Range Status   Micro Text Report   Final       COMMENT                   NO GROWTH AEROBICALLY/ANAEROBICALLY IN 5 DAYS   ANTIBIOTIC                                                      Culture, blood (single)     Status: None   Collection Time: 02/25/12  9:34 AM  Result Value Ref Range Status   Micro Text Report   Final       COMMENT                   NO GROWTH AEROBICALLY/ANAEROBICALLY IN 5 DAYS   ANTIBIOTIC                                                      Urine culture     Status: None   Collection Time: 02/25/12 11:36 AM  Result Value Ref Range Status   Micro Text Report   Final       SOURCE: CLEAN CATCH URINE    ORGANISM 1                2000 CFU/ML GRAM NEGATIVE ROD   ANTIBIOTIC                                                      Stool culture     Status: None   Collection Time: 02/27/12  2:55 PM  Result Value Ref Range Status   Micro Text Report   Final       COMMENT                   NO SALMONELLA OR SHIGELLA ISOLATED IN 36 HOURS  COMMENT                   NO CAMPYLOBACTER ANTIGEN DETECTED   COMMENT                   NO PATHOGENIC E.COLI DETECTED   ANTIBIOTIC                                                      Clostridium Difficile by PCR     Status: None   Collection Time: 02/27/12  2:55 PM  Result Value Ref Range Status   Micro Text Report   Final       COMMENT                   NEGATIVE-CLOS.DIFFICILE TOXIN NOT DETECTED BY PCR   ANTIBIOTIC                                                        Coagulation Studies: No results for input(s): LABPROT, INR in the last 72 hours.  Urinalysis:  Recent Labs Lab 08/06/15 2010  COLORURINE YELLOW*  LABSPEC 1.024  PHURINE 6.0  GLUCOSEU NEGATIVE  HGBUR NEGATIVE  BILIRUBINUR NEGATIVE  KETONESUR 1+*  PROTEINUR NEGATIVE  NITRITE POSITIVE*  LEUKOCYTESUR  NEGATIVE    Lipid Panel:  No results found for: CHOL, TRIG, HDL, CHOLHDL, VLDL, LDLCALC  HgbA1C: No results found for: HGBA1C  Urine Drug Screen:     Component Value Date/Time   LABOPIA NEGATIVE 03/13/2012 1243   LABBENZ NEGATIVE 03/13/2012 1243   AMPHETMU NEGATIVE 03/13/2012 1243   THCU NEGATIVE 03/13/2012 1243   LABBARB NEGATIVE 03/13/2012 1243    Alcohol Level: No results for input(s): ETH in the last 168 hours.  Other results: EKG: normal sinus rhythm at 80 bpm.  Imaging: Mr Virgel Paling Wo Contrast  08/06/2015  CLINICAL DATA:  Neck pain shooting up neck, similar symptoms last week. Chest tightness and LEFT arm swelling. History of lupus. EXAM: MRI HEAD WITHOUT AND WITH CONTRAST MRA HEAD WITHOUT CONTRAST TECHNIQUE: Multiplanar, multiecho pulse sequences of the brain and surrounding structures were obtained without and with intravenous contrast. Angiographic images of the head were obtained using MRA technique without contrast. CONTRAST:  1mL MULTIHANCE GADOBENATE DIMEGLUMINE 529 MG/ML IV SOLN COMPARISON:  CT head August 03, 2015 FINDINGS: MRI HEAD FINDINGS Severe susceptibility artifact attributed to hair clips seen on prior CT localizer. The ventricles and sulci are normal for patient's age. No abnormal parenchymal signal, mass lesions, mass effect. No abnormal intracranial enhancement the lateral cerebrum obscured by susceptibility artifact. No reduced diffusion to suggest acute ischemia though artifact distortion severely limits assessment. 2 mm LEFT basal ganglia perivascular space. No susceptibility artifact to suggest hemorrhage markedly limited by artifact . No abnormal extra-axial fluid collections. No extra-axial masses though, contrast enhanced sequences would be more sensitive. Normal major intracranial vascular flow voids seen at the skull base. Ocular globes and orbital contents are unremarkable though not tailored for evaluation. No abnormal sellar expansion. No suspicious  calvarial bone marrow signal. Craniocervical junction maintained. Visualized paranasal sinuses and mastoid air cells are well-aerated. MRA HEAD FINDINGS Limited by motion and susceptibility  artifact attributed air clips, resulted in duplicated appearance of the vessels. Anterior circulation: Normal flow related enhancement of the included cervical, petrous, cavernous and supraclinoid internal carotid arteries. Patent anterior communicating artery. Normal flow related enhancement of the anterior and middle cerebral arteries, including distal segments. No large vessel occlusion, high-grade stenosis, abnormal luminal irregularity, aneurysm. Posterior circulation: Codominant vertebral arteries. Basilar artery is patent, with normal flow related enhancement of the main branch vessels. Normal flow related enhancement of the posterior cerebral arteries. No large vessel occlusion, high-grade stenosis, abnormal luminal irregularity, aneurysm. IMPRESSION: MRI HEAD: Hair artifact, otherwise negative MRI of the brain with and without contrast. MRA HEAD: Hair artifact, otherwise negative MRA of the brain. Electronically Signed   By: Elon Alas M.D.   On: 08/06/2015 22:13   Mr Jeri Cos [id number] Contrast  08/06/2015  CLINICAL DATA:  Neck pain shooting up neck, similar symptoms last week. Chest tightness and LEFT arm swelling. History of lupus. EXAM: MRI HEAD WITHOUT AND WITH CONTRAST MRA HEAD WITHOUT CONTRAST TECHNIQUE: Multiplanar, multiecho pulse sequences of the brain and surrounding structures were obtained without and with intravenous contrast. Angiographic images of the head were obtained using MRA technique without contrast. CONTRAST:  79mL MULTIHANCE GADOBENATE DIMEGLUMINE 529 MG/ML IV SOLN COMPARISON:  CT head August 03, 2015 FINDINGS: MRI HEAD FINDINGS Severe susceptibility artifact attributed to hair clips seen on prior CT localizer. The ventricles and sulci are normal for patient's age. No abnormal parenchymal  signal, mass lesions, mass effect. No abnormal intracranial enhancement the lateral cerebrum obscured by susceptibility artifact. No reduced diffusion to suggest acute ischemia though artifact distortion severely limits assessment. 2 mm LEFT basal ganglia perivascular space. No susceptibility artifact to suggest hemorrhage markedly limited by artifact . No abnormal extra-axial fluid collections. No extra-axial masses though, contrast enhanced sequences would be more sensitive. Normal major intracranial vascular flow voids seen at the skull base. Ocular globes and orbital contents are unremarkable though not tailored for evaluation. No abnormal sellar expansion. No suspicious calvarial bone marrow signal. Craniocervical junction maintained. Visualized paranasal sinuses and mastoid air cells are well-aerated. MRA HEAD FINDINGS Limited by motion and susceptibility artifact attributed air clips, resulted in duplicated appearance of the vessels. Anterior circulation: Normal flow related enhancement of the included cervical, petrous, cavernous and supraclinoid internal carotid arteries. Patent anterior communicating artery. Normal flow related enhancement of the anterior and middle cerebral arteries, including distal segments. No large vessel occlusion, high-grade stenosis, abnormal luminal irregularity, aneurysm. Posterior circulation: Codominant vertebral arteries. Basilar artery is patent, with normal flow related enhancement of the main branch vessels. Normal flow related enhancement of the posterior cerebral arteries. No large vessel occlusion, high-grade stenosis, abnormal luminal irregularity, aneurysm. IMPRESSION: MRI HEAD: Hair artifact, otherwise negative MRI of the brain with and without contrast. MRA HEAD: Hair artifact, otherwise negative MRA of the brain. Electronically Signed   By: Elon Alas M.D.   On: 08/06/2015 22:13     Assessment/Plan: 50 year old female with left arm pain and weakness.   MRI of the brain personally reviewed and shows no acute changes.  MRA unremarkable as well.  Can not rule out a cervical etiology.  Pain not relieved with narcotics.  May get better results by further increasing medications that may address neuropathic pain.    Recommendations: 1.  MRI of cervical spine 2.  Increase Trileptal to 300mg  BID  Alexis Goodell, MD Neurology [phone] 08/07/2015, 12:58 PM

## 2015-08-07 NOTE — Progress Notes (Signed)
Initial Nutrition Assessment   INTERVENTION:   Meals and Snacks: Cater to patient preferences Medical Food Supplement Therapy: will send carnation Instant Breakfast on meal trays BID for added nutrition    NUTRITION DIAGNOSIS:   Inadequate oral intake related to poor appetite as evidenced by per patient/family report  GOAL:   Patient will meet greater than or equal to 90% of their needs  MONITOR:   PO intake, Supplement acceptance, Labs, Weight trends, I & O's  REASON FOR ASSESSMENT:   Malnutrition Screening Tool    ASSESSMENT:   Pt admitted with left arm weakness. Neurology consulted. Pt unavailable times 2 this am on rounds.  Past Medical History  Diagnosis Date  . Lupus ([place]) 1990  . Fibromyalgia   . Hypertension      Diet Order:  Diet 2 gram sodium Room service appropriate?: Yes; Fluid consistency:: Thin    Current Nutrition: Recorded po intake 20% of breakfast this am.   Food/Nutrition-Related History: Per MST decreased appetite PTA.   Scheduled Medications:  . baclofen  20 mg Oral TID  . enoxaparin (LOVENOX) injection  40 mg Subcutaneous QHS  . hydroxychloroquine  200 mg Oral BID  . OXcarbazepine  300 mg Oral BID  . pantoprazole  40 mg Oral Daily  . sodium chloride flush  3 mL Intravenous Q12H      Electrolyte/Renal Profile and Glucose Profile:   Recent Labs Lab 08/06/15 1856 08/07/15 0425  NA 139 139  K 3.5 3.0*  CL 105 103  CO2 25 29  BUN 7 5*  CREATININE 0.66 0.71  0.74  CALCIUM 8.7* 8.4*  GLUCOSE 86 86   Protein Profile:  Recent Labs Lab 08/06/15 1856  ALBUMIN 3.8    Gastrointestinal Profile: Last BM:  08/05/2015   Nutrition-Focused Physical Exam Findings:  Unable to complete Nutrition-Focused physical exam at this time.    Weight Change: Per chart review 2% weight loss in 3 months   Height:   Ht Readings from Last 1 Encounters:  08/07/15 5\' 4"  (1.626 m)    Weight:   Wt Readings from Last 1 Encounters:   08/07/15 176 lb (79.833 kg)   Wt Readings from Last 10 Encounters:  08/07/15 176 lb (79.833 kg)  08/03/15 175 lb (79.379 kg)  04/24/15 180 lb (81.647 kg)  02/22/15 180 lb (81.647 kg)  10/09/14 170 lb (77.111 kg)    BMI:  Body mass index is 30.2 kg/(m^2).  Estimated Nutritional Needs:   Kcal:  BEE: 1415kcals, TEE: (IF 1.1-1.3)(AF 1.2) [id number]  Protein:  88-104g protein (1.0-1.2g/kg)  Fluid:  2-2.4L fluid (25-33mL/kg)  EDUCATION NEEDS:   Education needs no appropriate at this time  [place], RD, LDN Pager [phone] Weekend/On-Call Pager [phone]

## 2015-08-07 NOTE — Care Management Obs Status (Signed)
[facility] NOTIFICATION   Patient Details  Name: Ana [name] MRN: [id number] Date of Birth: Aug 04, 1965   Medicare Observation Status Notification Given:  Yes    Shelbie Ammons, RN 08/07/2015, 1:18 PM

## 2015-08-08 DIAGNOSIS — R531 Weakness: Secondary | ICD-10-CM | POA: Diagnosis not present

## 2015-08-08 LAB — BASIC METABOLIC PANEL
ANION GAP: 9 (ref 5–15)
BUN: 6 mg/dL (ref 6–20)
CALCIUM: 8.9 mg/dL (ref 8.9–10.3)
CO2: 23 mmol/L (ref 22–32)
Chloride: 104 mmol/L (ref 101–111)
Creatinine, Ser: 0.83 mg/dL (ref 0.44–1.00)
GFR calc non Af Amer: >60 mL/min (ref >60)
Glucose, Bld: 85 mg/dL (ref 65–99)
POTASSIUM: 4.1 mmol/L (ref 3.5–5.1)
Sodium: 136 mmol/L (ref 135–145)

## 2015-08-08 LAB — PHOSPHORUS: PHOSPHORUS: 4 mg/dL (ref 2.5–4.6)

## 2015-08-08 LAB — MAGNESIUM: Magnesium: 2 mg/dL (ref 1.7–2.4)

## 2015-08-08 MED ORDER — OXCARBAZEPINE 300 MG PO TABS: 300.0000 mg | ORAL_TABLET | Freq: Two times a day (BID) | ORAL | Status: DC

## 2015-08-08 MED ORDER — POTASSIUM CHLORIDE 10 MEQ/100ML IV SOLN
10.0000 meq | INTRAVENOUS | Status: AC
  Filled 2015-08-08 (×4): qty 100

## 2015-08-08 MED ORDER — HYDROCODONE-ACETAMINOPHEN 5-325 MG PO TABS: 1.0000 | ORAL_TABLET | Freq: Four times a day (QID) | ORAL | Status: DC | PRN

## 2015-08-08 MED ORDER — HYDRALAZINE HCL 25 MG PO TABS: 25.0000 mg | ORAL_TABLET | Freq: Two times a day (BID) | ORAL | Status: DC

## 2015-08-08 MED ORDER — HYDRALAZINE HCL 25 MG PO TABS
25.0000 mg | ORAL_TABLET | Freq: Two times a day (BID) | ORAL | Status: DC
  Administered 2015-08-08: 25 mg via ORAL
  Filled 2015-08-08 (×2): qty 1

## 2015-08-08 MED ORDER — PROMETHAZINE HCL 50 MG PO TABS: 50.0000 mg | ORAL_TABLET | Freq: Four times a day (QID) | ORAL | Status: DC | PRN

## 2015-08-08 MED ORDER — PROMETHAZINE HCL 50 MG PO TABS: 25.0000 mg | ORAL_TABLET | Freq: Four times a day (QID) | ORAL | Status: DC | PRN

## 2015-08-08 NOTE — Progress Notes (Signed)
Pt alert and oriented x4, no complaints of pain or discomfort.  Bed in low position, call bell within reach.  Bed alarms on and functioning.  Assessment done and charted.  Will continue to monitor and do hourly rounding throughout the shift 

## 2015-08-08 NOTE — Progress Notes (Signed)
Dr. Estanislado Pandy notified pt BP 148/106, HR 76 this morning. Telephone order read back & verified for Hydralazine 25 mg PO twice daily, first dose now. Will continue to monitor.

## 2015-08-08 NOTE — Progress Notes (Signed)
Discharge: Pt d/c from room via wheelchair, Family member with the pt. Discharge instructions given to the patient and family members.  No questions from pt, reintegrated to the pt to call or go to the ED for chest discomfort. Pt dressed in street clothes and left with discharge papers and prescriptions in hand. IV d/ced, tele removed and no complaints of pain or discomfort. 
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
## 2015-08-08 NOTE — Progress Notes (Signed)
MEDICATION RELATED CONSULT NOTE - INITIAL   Pharmacy Consult for Electrolyte Management Indication: hypokalemia  Allergies  Allergen Reactions  . Mirtazapine Other (See Comments)    hallucinations    Patient Measurements: Height: 5\' 4"  (162.6 cm) Weight: 176 lb (79.833 kg) IBW/kg (Calculated) : 54.7   Vital Signs: Temp: 98.4 F (36.9 C) (03/04 0450) Temp Source: Oral (03/04 0450) BP: 172/86 mmHg (03/04 0644) Pulse Rate: 73 (03/04 0641) Intake/Output from previous day: 03/03 0701 - 03/04 0700 In: 480 [P.O.:480] Out: 100 [Urine:100] Intake/Output from this shift:    Labs:  Recent Labs  08/06/15 1856 08/07/15 0425  WBC 3.1* 2.5*  HGB 12.1 12.3  HCT 35.5 36.9  PLT 182 173  CREATININE 0.66 0.71  0.74  MG  --  1.7  ALBUMIN 3.8  --   PROT 7.4  --   AST 17  --   ALT 16  --   ALKPHOS 52  --   BILITOT 0.4  --    Estimated Creatinine Clearance: 87.8 mL/min (by C-G formula based on Cr of 0.74).   Microbiology: No results found for this or any previous visit (from the past 720 hour(s)).  Medical History: Past Medical History  Diagnosis Date  . Lupus ([place]) 1990  . Fibromyalgia   . Hypertension     Medications:  Scheduled:  . baclofen  20 mg Oral TID  . enoxaparin (LOVENOX) injection  40 mg Subcutaneous QHS  . hydrALAZINE  25 mg Oral BID  . hydroxychloroquine  200 mg Oral BID  . OXcarbazepine  300 mg Oral BID  . pantoprazole  40 mg Oral Daily  . potassium chloride  10 mEq Intravenous Q1 Hr x 4  . sodium chloride flush  3 mL Intravenous Q12H    Assessment: Patient is a 50 yo female admitted for left arm weakness.    K: 3.0, Mag: 1.7  Patient received potassium chloride 20 meq po once.    Goal of Therapy:  K: 3.5-5.1 Mag: 1.7-2.7  Plan:  K 3 this morning. Will give 10 mEq IV potassium chloride Q1H x 4 doses, check magnesium and phosphorus as add-on labs this morning, repeat BMP this afternoon, and repeat all electrolytes tomorrow  morning.  Laural Benes, Pharm.D., BCPS Clinical Pharmacist 08/08/2015,6:49 AM

## 2015-08-13 DIAGNOSIS — G9332 Myalgic encephalomyelitis/chronic fatigue syndrome: Secondary | ICD-10-CM | POA: Insufficient documentation

## 2015-08-13 DIAGNOSIS — D759 Disease of blood and blood-forming organs, unspecified: Secondary | ICD-10-CM | POA: Insufficient documentation

## 2015-08-13 DIAGNOSIS — R112 Nausea with vomiting, unspecified: Secondary | ICD-10-CM | POA: Insufficient documentation

## 2015-08-13 DIAGNOSIS — R5382 Chronic fatigue, unspecified: Secondary | ICD-10-CM | POA: Insufficient documentation

## 2015-08-13 DIAGNOSIS — D8989 Other specified disorders involving the immune mechanism, not elsewhere classified: Secondary | ICD-10-CM | POA: Insufficient documentation

## 2015-08-13 DIAGNOSIS — M329 Systemic lupus erythematosus, unspecified: Secondary | ICD-10-CM | POA: Insufficient documentation

## 2015-08-13 DIAGNOSIS — M7062 Trochanteric bursitis, left hip: Secondary | ICD-10-CM | POA: Insufficient documentation

## 2015-10-13 ENCOUNTER — Ambulatory Visit: Payer: Medicare Other | Attending: Pain Medicine | Admitting: Pain Medicine

## 2015-10-13 ENCOUNTER — Encounter: Payer: Self-pay | Admitting: Pain Medicine

## 2015-10-13 VITALS — BP 156/1 | HR 87 | Temp 98.6°F | Resp 16 | Ht 64.0 in | Wt 178.0 lb

## 2015-10-13 DIAGNOSIS — R112 Nausea with vomiting, unspecified: Secondary | ICD-10-CM | POA: Diagnosis not present

## 2015-10-13 DIAGNOSIS — H0520 Unspecified exophthalmos: Secondary | ICD-10-CM | POA: Diagnosis not present

## 2015-10-13 DIAGNOSIS — M797 Fibromyalgia: Secondary | ICD-10-CM | POA: Diagnosis not present

## 2015-10-13 DIAGNOSIS — M7062 Trochanteric bursitis, left hip: Secondary | ICD-10-CM | POA: Diagnosis not present

## 2015-10-13 DIAGNOSIS — M542 Cervicalgia: Secondary | ICD-10-CM | POA: Diagnosis not present

## 2015-10-13 DIAGNOSIS — I10 Essential (primary) hypertension: Secondary | ICD-10-CM | POA: Insufficient documentation

## 2015-10-13 DIAGNOSIS — Z79899 Other long term (current) drug therapy: Secondary | ICD-10-CM | POA: Diagnosis not present

## 2015-10-13 DIAGNOSIS — G8929 Other chronic pain: Secondary | ICD-10-CM | POA: Diagnosis not present

## 2015-10-13 DIAGNOSIS — F329 Major depressive disorder, single episode, unspecified: Secondary | ICD-10-CM | POA: Diagnosis not present

## 2015-10-13 DIAGNOSIS — Z0189 Encounter for other specified special examinations: Secondary | ICD-10-CM | POA: Insufficient documentation

## 2015-10-13 DIAGNOSIS — Z5181 Encounter for therapeutic drug level monitoring: Secondary | ICD-10-CM

## 2015-10-13 DIAGNOSIS — M329 Systemic lupus erythematosus, unspecified: Secondary | ICD-10-CM | POA: Diagnosis not present

## 2015-10-13 DIAGNOSIS — R5382 Chronic fatigue, unspecified: Secondary | ICD-10-CM | POA: Insufficient documentation

## 2015-10-13 DIAGNOSIS — M79641 Pain in right hand: Secondary | ICD-10-CM | POA: Insufficient documentation

## 2015-10-13 DIAGNOSIS — M5412 Radiculopathy, cervical region: Secondary | ICD-10-CM | POA: Insufficient documentation

## 2015-10-13 DIAGNOSIS — M545 Low back pain, unspecified: Secondary | ICD-10-CM

## 2015-10-13 DIAGNOSIS — Z79891 Long term (current) use of opiate analgesic: Secondary | ICD-10-CM | POA: Insufficient documentation

## 2015-10-13 DIAGNOSIS — F1190 Opioid use, unspecified, uncomplicated: Secondary | ICD-10-CM | POA: Insufficient documentation

## 2015-10-13 DIAGNOSIS — M069 Rheumatoid arthritis, unspecified: Secondary | ICD-10-CM | POA: Diagnosis not present

## 2015-10-13 DIAGNOSIS — M25511 Pain in right shoulder: Secondary | ICD-10-CM | POA: Diagnosis not present

## 2015-10-13 DIAGNOSIS — M25551 Pain in right hip: Secondary | ICD-10-CM | POA: Insufficient documentation

## 2015-10-13 DIAGNOSIS — N644 Mastodynia: Secondary | ICD-10-CM | POA: Diagnosis not present

## 2015-10-13 DIAGNOSIS — Z6830 Body mass index (BMI) 30.0-30.9, adult: Secondary | ICD-10-CM | POA: Diagnosis not present

## 2015-10-13 DIAGNOSIS — R209 Unspecified disturbances of skin sensation: Secondary | ICD-10-CM

## 2015-10-13 DIAGNOSIS — Z9049 Acquired absence of other specified parts of digestive tract: Secondary | ICD-10-CM | POA: Diagnosis not present

## 2015-10-13 DIAGNOSIS — D61818 Other pancytopenia: Secondary | ICD-10-CM | POA: Insufficient documentation

## 2015-10-13 DIAGNOSIS — M25562 Pain in left knee: Secondary | ICD-10-CM | POA: Diagnosis not present

## 2015-10-13 DIAGNOSIS — M25561 Pain in right knee: Secondary | ICD-10-CM | POA: Diagnosis not present

## 2015-10-13 DIAGNOSIS — R531 Weakness: Secondary | ICD-10-CM | POA: Diagnosis present

## 2015-10-13 DIAGNOSIS — M79642 Pain in left hand: Secondary | ICD-10-CM | POA: Diagnosis not present

## 2015-10-13 DIAGNOSIS — R937 Abnormal findings on diagnostic imaging of other parts of musculoskeletal system: Secondary | ICD-10-CM

## 2015-10-13 DIAGNOSIS — E669 Obesity, unspecified: Secondary | ICD-10-CM | POA: Diagnosis not present

## 2015-10-13 DIAGNOSIS — M5021 Other cervical disc displacement,  high cervical region: Secondary | ICD-10-CM | POA: Diagnosis not present

## 2015-10-13 DIAGNOSIS — M25559 Pain in unspecified hip: Secondary | ICD-10-CM

## 2015-10-13 DIAGNOSIS — M25552 Pain in left hip: Secondary | ICD-10-CM | POA: Diagnosis not present

## 2015-10-13 DIAGNOSIS — G4700 Insomnia, unspecified: Secondary | ICD-10-CM | POA: Diagnosis not present

## 2015-10-13 DIAGNOSIS — M25512 Pain in left shoulder: Secondary | ICD-10-CM | POA: Insufficient documentation

## 2015-10-13 NOTE — Patient Instructions (Addendum)
Instructed to get labs drawn todayPain Management Discharge Instructions  General Discharge Instructions :  If you need to reach your doctor call: Monday-Friday 8:00 am - 4:00 pm at [phone] or toll free [phone].  After clinic hours [phone] to have operator reach doctor.  Bring all of your medication bottles to all your appointments in the pain clinic.  To cancel or reschedule your appointment with Pain Management please remember to call 24 hours in advance to avoid a fee.  Refer to the educational materials which you have been given on: General Risks, I had my Procedure. Discharge Instructions, Post Sedation.  Post Procedure Instructions:  The drugs you were given will stay in your system until tomorrow, so for the next 24 hours you should not drive, make any legal decisions or drink any alcoholic beverages.  You may eat anything you prefer, but it is better to start with liquids then soups and crackers, and gradually work up to solid foods.  Please notify your doctor immediately if you have any unusual bleeding, trouble breathing or pain that is not related to your normal pain.  Depending on the type of procedure that was done, some parts of your body may feel week and/or numb.  This usually clears up by tonight or the next day.  Walk with the use of an assistive device or accompanied by an adult for the 24 hours.  You may use ice on the affected area for the first 24 hours.  Put ice in a Ziploc bag and cover with a towel and place against area 15 minutes on 15 minutes off.  You may switch to heat after 24 hours.

## 2015-10-13 NOTE — Progress Notes (Signed)
Safety precautions to be maintained throughout the outpatient stay will include: orient to surroundings, keep bed in low position, maintain call bell within reach at all times, provide assistance with transfer out of bed and ambulation.  

## 2015-10-13 NOTE — Assessment & Plan Note (Signed)
EXAM: MRI CERVICAL SPINE WITHOUT CONTRAST COMPARISON: Cervical spine CT scan 08/03/2015. FINDINGS: Normal alignment of the cervical vertebral bodies. They demonstrate normal marrow signal. The cervical spinal cord demonstrates normal signal intensity. No cord lesions or syrinx. The facets are normally aligned. Mild multilevel facet disease. C2-3:No significant findings. C3-4:No significant findings. C3-4: Mild annular bulge with slight flattening of the ventral thecal sac and mild narrowing of the ventral CSF space. No significant foraminal stenosis. C5-6: Bulging annulus with flattening of the ventral thecal sac and mild narrowing of the ventral CSF space. There is also a shallow central disc protrusion without direct neural compression. No significant foraminal stenosis. C6-7: Shallow central disc protrusion with mild impression on the ventral thecal sac and mild narrowing of the ventral CSF space. No foraminal stenosis. C7-T1: No significant findings. IMPRESSION: 1. Shallow disc protrusions at C5-6 and C6-7 with mild impression on the thecal sac but no significant neural compression or foraminal stenosis. 2. Mild annular bulge at C3-4. 3. Normal MR appearance of the cervical spinal cord.

## 2015-10-13 NOTE — Progress Notes (Signed)
Patient's Name: Ana [name]  Patient type: New patient  MRN: [id number]  Service setting: Ambulatory outpatient  DOB: 26-Sep-1965  Location: [facility]  DOS: 10/13/2015  Primary Care Physician: [facility]  Note by: Beatriz Chancellor A. Dossie Arbour, M.D, DABA, DABAPM, DABPM, DABIPP, FIPP  Referring Physician: Katheren Shams  Specialty: Board-Certified Interventional Pain Management     Primary Reason(s) for Visit: Initial Patient Evaluation CC: Muscle Pain   HPI  Ms. Goette is a 50 y.o. year old, female patient, who comes today for an initial evaluation. She has Left arm weakness; Chronic pain; Long term current use of opiate analgesic; Long term prescription opiate use; Opiate use; Encounter for therapeutic drug level monitoring; Encounter for pain management planning; Chronic shoulder pain (Location of Primary Source of Pain) (Bilateral) (L>R); Chronic Shoulder Radicular Pain; Chronic neck pain (Location of Secondary source of pain) (Bilateral) (R>L); Chronic knee pain (Location of Tertiary source of pain) (Bilateral) (L>R); Chronic hip pain (Bilateral) (L>R); Chronic low back pain (Bilateral) (midline) (L>R); Fibromyalgia; Disturbance of skin sensation; Disorder of hematopoietic structure; CFIDS (chronic fatigue and immune dysfunction syndrome); Clinical depression; Essential (primary) hypertension; Systemic lupus erythematosus ([place]); N&V (nausea and vomiting); Trochanteric bursitis of hip (Left); Neurosis, posttraumatic; Pancytopenia ([place]); Arthralgia of multiple joints; Breast pain; Low serum cobalamin; Cannot sleep; Chronic hand pain (Left) (index finger); Chronic hand pain (Right) (middle finger); and Abnormal MRI, cervical spine (08/07/2015) on her problem list.. Her primarily concern today is the Muscle Pain   Pain Assessment: Self-Reported Pain Score: 10-Worst pain ever, clinically she looks like a 2-3/10. Reported level of pain is not compatible with  clinical observations. This symptom exaggeration may be due to malingering, an emotional response, Somatic Symptom Disorder, or a lack of understanding on how the pain scale works. Pain Location:  ("muscle and joint pain") Pain Descriptors / Indicators: Sharp ("like a toothache") Pain Frequency: Constant  Onset and Duration: Gradual, Date of onset: Many years ago and Present longer than 3 months Cause of pain: Arthritis, lupus and fibromyalgia Severity: Getting worse, NAS-11 at its worse: 10/10, NAS-11 at its best: 1/10, NAS-11 now: 10/10 and NAS-11 on the average: 10/10 Timing: Not influenced by the time of the day, During activity or exercise and After activity or exercise Aggravating Factors: Bending, Climbing, Kneeling, Lifiting, Prolonged standing, Squatting, Stooping , Twisting and Walking uphill Alleviating Factors: Medications and Resting Associated Problems: Nausea, Numbness, Swelling, Pain that wakes patient up and Pain that does not allow patient to sleep Quality of Pain: Aching, Agonizing, Constant, Deep, Disabling, Sharp, Stabbing and Toothache-like Previous Examinations or Tests: CT scan, MRI scan, X-rays and Orthoperdic evaluation Previous Treatments: Narcotic medications. In addition to this patient indicates having had some shoulder and hip injections by Dr. Jerrol Banana, a rheumatologist at [facility]. The patient indicates that she was doing best when she was on fentanyl patches 75 mcg/h but that she also did well when she was placed on gabapentin 100 mg 4 times a day plus oxycodone 5 mg 1 tablet by mouth 3 times a day.  The patient comes into the products today for the first time, referred to [place] by the [facility]. The patient identifies her primary care physician is Dr. Lawrence Santiago telephone number [phone]. The patient comes in today indicating that her primary pain is that of the shoulders with the left being worst on the right. She also indicates having pain in the area of  the left shoulder blade. Next is her neck pain  which is also bilateral but with the right side being worst on the left. Following the neck pain is her knee pain which is also bilateral with the left being worst on the right. In the case of the left knee as well as the right knee, the pain is down the anterior portion of the knee.  Next is the hip pain which is also bilateral with the left being worse than the right. The patient has a prior history of a left trochanteric bursitis. In addition to this the patient identifies her hand and finger pains to be the next worst pain with the right side being worst on the left. In the case of the right hand the worst finger is the index finger while on the left hand it is the middle finger. She indicates being right-handed.  Following this is the patient's low back pain which is worse in the midline but it also refers bilaterally with the left side being worst on the right. Next is her feet pain with the right side being worst on the left. She indicates that in the case of both feet the pain goes to her big toe over the top of the foot in what appears to be an L5 dermatomal distribution.  In addition to this the patient complains of generalized arthralgias and myalgias with what appears to be Allodynia. The patient admits to having been to other pain clinics and in fact, I do not think that she remembers, but I previously evaluated her case around October 2013.  Historic Controlled Substance Pharmacotherapy Review  Previously Prescribed Opioids: Given the patient indicates that she was taking Duragesic 75 g per hour every 72 hours, the pmp reveals that nobody has prescribed that since 10/09/2009. In fact, there is no note in the pmp that she has been receiving the medication from anybody. According to the [facility] the highest dose that she ever took was oxycodone IR 10 mg every 6 hours and this was only for 5 days. Other than this the patient has taken hydrocodone/APAP  5/325, oxycodone IR 5 mg, tramadol 50 mg, Lyrica 100 mg 3 times a day, oxycodone/APAP 10/325 Ambien 10 mg, and Valium 5 mg. Currently Prescribed Analgesic: Hydrocodone/APAP 5/325 one twice a day (10 mg/day of hydrocodone) Medications: The patient did not comply with request to bring medications to the appointment MME/day: 10 mg/day Pharmacodynamics: Analgesic Effect: More than 50% Activity Facilitation: Medication(s) allow patient to sit, stand, walk, and do the basic ADLs Perceived Effectiveness: Described as relatively effective, allowing for increase in activities of daily living (ADL) Side-effects or Adverse reactions: None reported Historical Background Evaluation: [place] PDMP: Five (5) year initial data search conducted. No abnormal patterns identified [place] [employer] Offender Public Information: Non-contributory Historical Hospital-associated UDS Results:   Lab Results  Component Value Date   THCU NEGATIVE 03/13/2012   COCAINSCRNUR NEGATIVE 03/13/2012   PCPSCRNUR NEGATIVE 03/13/2012   MDMA NEGATIVE 03/13/2012   AMPHETMU NEGATIVE 03/13/2012   METHADONE NEGATIVE 03/13/2012   ETOH < 3 05/04/2014   UDS Results: No UDS results available at this time UDS Interpretation: N/A Medication Assessment Form: Not applicable. Initial evaluation. The patient has not received any medications from our practice Treatment compliance: Not applicable. Initial evaluation Risk Assessment: Aberrant Behavior: None observed or detected today Opioid Fatal Overdose Risk Factors: None identified today Non-fatal overdose hazard ratio (HR): Calculation deferred Fatal overdose hazard ratio (HR): Calculation deferred Substance Use Disorder (SUD) Risk Level: Pending results of Medical Psychology Evaluation  for SUD Opioid Risk Tool (ORT) Score: Total Score: 3 Low Risk for SUD (Score <3) Depression Scale Score: PHQ-2: PHQ-2 Total Score: 0 No depression (0) PHQ-9: PHQ-9 Total Score: 0 No depression  (0-4)  Pharmacologic Plan: Pending ordered tests and/or consults  Meds  The patient has a current medication list which includes the following prescription(s): hydrocodone-acetaminophen, hydroxychloroquine, nortriptyline, and promethazine.  ROS  Cardiovascular History: Negative for hypertension, coronary artery diseas, myocardial infraction, anticoagulant therapy or heart failure Pulmonary or Respiratory History: Negative for bronchial asthma, emphysema, chronic smoking, chronic bronchitis, sarcoidosis, tuberculosis or sleep apena Neurological History: Negative for epilepsy, stroke, urinary or fecal inontinence, spina bifida or tethered cord syndrome Review of Past Neurological Studies:  Results for orders placed or performed during the hospital encounter of 08/06/15  MR Brain W Wo Contrast   Narrative   CLINICAL DATA:  Neck pain shooting up neck, similar symptoms last week. Chest tightness and LEFT arm swelling. History of lupus.  EXAM: MRI HEAD WITHOUT AND WITH CONTRAST  MRA HEAD WITHOUT CONTRAST  TECHNIQUE: Multiplanar, multiecho pulse sequences of the brain and surrounding structures were obtained without and with intravenous contrast. Angiographic images of the head were obtained using MRA technique without contrast.  CONTRAST:  60mL MULTIHANCE GADOBENATE DIMEGLUMINE 529 MG/ML IV SOLN  COMPARISON:  CT head August 03, 2015  FINDINGS: MRI HEAD FINDINGS  Severe susceptibility artifact attributed to hair clips seen on prior CT localizer. The ventricles and sulci are normal for patient's age. No abnormal parenchymal signal, mass lesions, mass effect. No abnormal intracranial enhancement the lateral cerebrum obscured by susceptibility artifact. No reduced diffusion to suggest acute ischemia though artifact distortion severely limits assessment. 2 mm LEFT basal ganglia perivascular space. No susceptibility artifact to suggest hemorrhage markedly limited by artifact .  No  abnormal extra-axial fluid collections. No extra-axial masses though, contrast enhanced sequences would be more sensitive. Normal major intracranial vascular flow voids seen at the skull base.  Ocular globes and orbital contents are unremarkable though not tailored for evaluation. No abnormal sellar expansion. No suspicious calvarial bone marrow signal. Craniocervical junction maintained. Visualized paranasal sinuses and mastoid air cells are well-aerated.  MRA HEAD FINDINGS  Limited by motion and susceptibility artifact attributed air clips, resulted in duplicated appearance of the vessels.  Anterior circulation: Normal flow related enhancement of the included cervical, petrous, cavernous and supraclinoid internal carotid arteries. Patent anterior communicating artery. Normal flow related enhancement of the anterior and middle cerebral arteries, including distal segments.  No large vessel occlusion, high-grade stenosis, abnormal luminal irregularity, aneurysm.  Posterior circulation: Codominant vertebral arteries. Basilar artery is patent, with normal flow related enhancement of the main branch vessels. Normal flow related enhancement of the posterior cerebral arteries.  No large vessel occlusion, high-grade stenosis, abnormal luminal irregularity, aneurysm.  IMPRESSION: MRI HEAD: Hair artifact, otherwise negative MRI of the brain with and without contrast.  MRA HEAD: Hair artifact, otherwise negative MRA of the brain.   Electronically Signed   By: Elon Alas M.D.   On: 08/06/2015 22:13   MR MRA HEAD WO CONTRAST   Narrative   CLINICAL DATA:  Neck pain shooting up neck, similar symptoms last week. Chest tightness and LEFT arm swelling. History of lupus.  EXAM: MRI HEAD WITHOUT AND WITH CONTRAST  MRA HEAD WITHOUT CONTRAST  TECHNIQUE: Multiplanar, multiecho pulse sequences of the brain and surrounding structures were obtained without and with intravenous  contrast. Angiographic images of the head were obtained using MRA technique without contrast.  CONTRAST:  41mL MULTIHANCE GADOBENATE DIMEGLUMINE 529 MG/ML IV SOLN  COMPARISON:  CT head August 03, 2015  FINDINGS: MRI HEAD FINDINGS  Severe susceptibility artifact attributed to hair clips seen on prior CT localizer. The ventricles and sulci are normal for patient's age. No abnormal parenchymal signal, mass lesions, mass effect. No abnormal intracranial enhancement the lateral cerebrum obscured by susceptibility artifact. No reduced diffusion to suggest acute ischemia though artifact distortion severely limits assessment. 2 mm LEFT basal ganglia perivascular space. No susceptibility artifact to suggest hemorrhage markedly limited by artifact .  No abnormal extra-axial fluid collections. No extra-axial masses though, contrast enhanced sequences would be more sensitive. Normal major intracranial vascular flow voids seen at the skull base.  Ocular globes and orbital contents are unremarkable though not tailored for evaluation. No abnormal sellar expansion. No suspicious calvarial bone marrow signal. Craniocervical junction maintained. Visualized paranasal sinuses and mastoid air cells are well-aerated.  MRA HEAD FINDINGS  Limited by motion and susceptibility artifact attributed air clips, resulted in duplicated appearance of the vessels.  Anterior circulation: Normal flow related enhancement of the included cervical, petrous, cavernous and supraclinoid internal carotid arteries. Patent anterior communicating artery. Normal flow related enhancement of the anterior and middle cerebral arteries, including distal segments.  No large vessel occlusion, high-grade stenosis, abnormal luminal irregularity, aneurysm.  Posterior circulation: Codominant vertebral arteries. Basilar artery is patent, with normal flow related enhancement of the main branch vessels. Normal flow related  enhancement of the posterior cerebral arteries.  No large vessel occlusion, high-grade stenosis, abnormal luminal irregularity, aneurysm.  IMPRESSION: MRI HEAD: Hair artifact, otherwise negative MRI of the brain with and without contrast.  MRA HEAD: Hair artifact, otherwise negative MRA of the brain.   Electronically Signed   By: Elon Alas M.D.   On: 08/06/2015 22:13   Results for orders placed or performed during the hospital encounter of 08/03/15  CT Head Wo Contrast   Narrative   CLINICAL DATA:  50 year old with left neck and shoulder pain. Headache.  EXAM: CT HEAD WITHOUT CONTRAST  CT CERVICAL SPINE WITHOUT CONTRAST  TECHNIQUE: Multidetector CT imaging of the head and cervical spine was performed following the standard protocol without intravenous contrast. Multiplanar CT image reconstructions of the cervical spine were also generated.  COMPARISON:  CT orbits 01/07/2007  FINDINGS: CT HEAD FINDINGS  No evidence for acute hemorrhage, mass lesion, midline shift, hydrocephalus or large infarct. There is mild exophthalmos bilaterally and this is similar to the previous CT. Visualized paranasal sinuses are clear. No calvarial fracture.  CT CERVICAL SPINE FINDINGS  Lung apices are clear without a pneumothorax. Negative for an acute fracture or dislocation. Small lymph nodes on both sides of the neck. There is no significant soft tissue swelling in the neck. Normal appearance of thyroid tissue. Normal alignment of the cervical spine. No significant disc space narrowing or facet disease. There are prominent periapical lucencies involving the right posterior molars. Evidence for dental caries.  IMPRESSION: No acute intracranial abnormality.  No acute bone abnormality in the cervical spine. No significant degenerative disease in cervical spine.  Odontogenic disease as described.  Stable mild exophthalmos.   Electronically Signed   By: Markus Daft  M.D.   On: 08/03/2015 13:48    Psychological-Psychiatric History: Insomnia Gastrointestinal History: Negative for peptic ulcer disease, hiatal hernia, GERD, IBS, hepatitis, cirrhosis or pancreatitis Genitourinary History: Negative for nephrolithiasis, hematuria, renal failure or chronic kidney disease Hematological History: Anemia and Brusing easily Endocrine History: Negative for diabetes or thyroid  disease Rheumatologic History: Lupus, Rheumatoid arthritis and Fibromyalgia Musculoskeletal History: Negative for myasthenia gravis, muscular dystrophy, multiple sclerosis or malignant hyperthermia Work History: Disabled  Allergies  Ms. Arceneaux is allergic to mirtazapine.  [place]  Medical:  Ms. Tschida  has a past medical history of Lupus ([place]) (1990); Fibromyalgia; Hypertension; and Lupus ([place]) (08/07/2015). Family: family history includes Diabetes in her mother; Testicular cancer in her father. Surgical:  has past surgical history that includes Cholecystectomy; right ankle surgery; Cesarean section; Abdominal hysterectomy; and Ankle surgery (Right). Tobacco:  reports that she has never smoked. She does not have any smokeless tobacco history on file. Alcohol:  reports that she drinks about 1.2 oz of alcohol per week. Drug:  reports that she does not use illicit drugs. Active Ambulatory Problems    Diagnosis Date Noted  . Left arm weakness 08/07/2015  . Chronic pain 10/13/2015  . Long term current use of opiate analgesic 10/13/2015  . Long term prescription opiate use 10/13/2015  . Opiate use 10/13/2015  . Encounter for therapeutic drug level monitoring 10/13/2015  . Encounter for pain management planning 10/13/2015  . Chronic shoulder pain (Location of Primary Source of Pain) (Bilateral) (L>R) 10/13/2015  . Chronic Shoulder Radicular Pain 10/13/2015  . Chronic neck pain (Location of Secondary source of pain) (Bilateral) (R>L) 10/13/2015  . Chronic knee pain (Location of Tertiary source of  pain) (Bilateral) (L>R) 10/13/2015  . Chronic hip pain (Bilateral) (L>R) 10/13/2015  . Chronic low back pain (Bilateral) (midline) (L>R) 10/13/2015  . Fibromyalgia 12/11/2012  . Disturbance of skin sensation 10/13/2015  . 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 CFIDS (chronic fatigue and immune dysfunction syndrome) 08/13/2015  . Clinical depression 12/24/2014  . Essential (primary) hypertension 07/08/2013  . Systemic lupus erythematosus ([place]) 12/11/2012  . N&V (nausea and vomiting) 08/13/2015  . Trochanteric bursitis of hip (Left) 08/13/2015  . 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 Pancytopenia ([place]) 12/11/2012  . Arthralgia of multiple joints 01/10/2013  . Breast pain 07/08/2013  . Low serum cobalamin 04/29/2013  . Cannot sleep 12/23/2013  . Chronic hand pain (Left) (index finger) 10/13/2015  . Chronic hand pain (Right) (middle finger) 10/13/2015  . Abnormal MRI, cervical spine (08/07/2015) 10/13/2015   Resolved Ambulatory Problems    Diagnosis Date Noted  . No Resolved Ambulatory Problems   Past Medical History  Diagnosis Date  . Lupus ([place]) 1990  . Hypertension   . Lupus ([place]) 08/07/2015    Constitutional Exam  Vitals: Blood pressure 156/1, pulse 87, temperature 98.6 F (37 C), temperature source Oral, resp. rate 16, height 5\' 4"  (1.626 m), weight 178 lb (80.74 kg), SpO2 100 %. General appearance: Well nourished, well developed, and well hydrated. In no acute distress Calculated BMI/Body habitus: Body mass index is 30.54 kg/(m^2). (30-34.9 kg/m2) Obese (Class I) - 68% higher incidence of chronic pain Psych/Mental status: Alert and oriented x 3 (person, place, & time) Eyes: PERLA Respiratory: No evidence of acute respiratory distress  Cervical Spine Exam  Inspection: No masses, redness, or swelling Alignment: Symmetrical ROM: Functional: Adequate ROM Active: Unrestricted ROM Stability: No instability detected Muscle strength & Tone: Functionally  intact Sensory: Unimpaired Palpation: No complaints of tenderness  Upper Extremity (UE) Exam    Side: Right upper extremity  Side: Left upper extremity  Inspection: No masses, redness, swelling, or asymmetry  Inspection: No masses, redness, swelling, or asymmetry  ROM:  ROM:  Functional: Adequate ROM  Functional: Adequate ROM  Active: Unrestricted ROM  Active: Unrestricted ROM  Muscle  strength & Tone: Functionally intact  Muscle strength & Tone: Functionally intact  Sensory: Unimpaired  Sensory: Unimpaired  Palpation: Non-contributory  Palpation: Non-contributory   Thoracic Spine Exam  Inspection: No masses, redness, or swelling Alignment: Symmetrical ROM: Functional: Adequate ROM Active: Unrestricted ROM Stability: No instability detected Sensory: Unimpaired Muscle strength & Tone: Functionally intact Palpation: No complaints of tenderness  Lumbar Spine Exam  Inspection: No masses, redness, or swelling Alignment: Symmetrical ROM: Functional: Adequate ROM Active: Unrestricted ROM Stability: No instability detected Muscle strength & Tone: Functionally intact Sensory: Unimpaired Palpation: No complaints of tenderness Provocative Tests: Lumbar Hyperextension and rotation test: deferred Patrick's Maneuver: deferred  Gait & Posture Assessment  Gait: Unaffected Posture: WNL  Lower Extremity Exam    Side: Right lower extremity  Side: Left lower extremity  Inspection: No masses, redness, swelling, or asymmetry ROM:  Inspection: No masses, redness, swelling, or asymmetry ROM:  Functional: Adequate ROM  Functional: Adequate ROM  Active: Unrestricted ROM  Active: Unrestricted ROM  Muscle strength & Tone: Functionally intact  Muscle strength & Tone: Functionally intact  Sensory: Unimpaired  Sensory: Unimpaired  Palpation: Non-contributory  Palpation: Non-contributory   Assessment  Primary Diagnosis & Pertinent Problem List: The primary encounter diagnosis was Chronic pain.  Diagnoses of Long term current use of opiate analgesic, Long term prescription opiate use, Opiate use, Encounter for therapeutic drug level monitoring, Encounter for pain management planning, Chronic shoulder pain (Location of Primary Source of Pain) (Bilateral) (L>R), Chronic Shoulder radicular pain, Chronic neck pain (Location of Secondary source of pain) (Bilateral) (R>L), Chronic knee pain (Location of Tertiary source of pain) (Bilateral) (L>R), Chronic hip pain, unspecified laterality, Chronic low back pain (Bilateral) (midline) (L>R), Disturbance of skin sensation, Chronic hand pain (Left) (index finger), Chronic hand pain (Right) (middle finger), and Abnormal MRI, cervical spine were also pertinent to this visit.  Visit Diagnosis: 1. Chronic pain   2. Long term current use of opiate analgesic   3. Long term prescription opiate use   4. Opiate use   5. Encounter for therapeutic drug level monitoring   6. Encounter for pain management planning   7. Chronic shoulder pain (Location of Primary Source of Pain) (Bilateral) (L>R)   8. Chronic Shoulder radicular pain   9. Chronic neck pain (Location of Secondary source of pain) (Bilateral) (R>L)   10. Chronic knee pain (Location of Tertiary source of pain) (Bilateral) (L>R)   11. Chronic hip pain, unspecified laterality   12. Chronic low back pain (Bilateral) (midline) (L>R)   13. Disturbance of skin sensation   14. Chronic hand pain (Left) (index finger)   15. Chronic hand pain (Right) (middle finger)   16. Abnormal MRI, cervical spine     Assessment: Abnormal MRI, cervical spine (08/07/2015) EXAM: MRI CERVICAL SPINE WITHOUT CONTRAST COMPARISON: Cervical spine CT scan 08/03/2015. FINDINGS: Normal alignment of the cervical vertebral bodies. They demonstrate normal marrow signal. The cervical spinal cord demonstrates normal signal intensity. No cord lesions or syrinx. The facets are normally aligned. Mild multilevel facet disease. C2-3:No  significant findings. C3-4:No significant findings. C3-4: Mild annular bulge with slight flattening of the ventral thecal sac and mild narrowing of the ventral CSF space. No significant foraminal stenosis. C5-6: Bulging annulus with flattening of the ventral thecal sac and mild narrowing of the ventral CSF space. There is also a shallow central disc protrusion without direct neural compression. No significant foraminal stenosis. C6-7: Shallow central disc protrusion with mild impression on the ventral thecal sac and mild narrowing  of the ventral CSF space. No foraminal stenosis. C7-T1: No significant findings. IMPRESSION: 1. Shallow disc protrusions at C5-6 and C6-7 with mild impression on the thecal sac but no significant neural compression or foraminal stenosis. 2. Mild annular bulge at C3-4. 3. Normal MR appearance of the cervical spinal cord.    Plan of Care  Initial Treatment Plan:  Please be advised that as per protocol, today's visit has been an evaluation only. We have not taken over the patient's controlled substance management.  Problem List Items Addressed This Visit      High   Abnormal MRI, cervical spine (08/07/2015)    EXAM: MRI CERVICAL SPINE WITHOUT CONTRAST COMPARISON: Cervical spine CT scan 08/03/2015. FINDINGS: Normal alignment of the cervical vertebral bodies. They demonstrate normal marrow signal. The cervical spinal cord demonstrates normal signal intensity. No cord lesions or syrinx. The facets are normally aligned. Mild multilevel facet disease. C2-3:No significant findings. C3-4:No significant findings. C3-4: Mild annular bulge with slight flattening of the ventral thecal sac and mild narrowing of the ventral CSF space. No significant foraminal stenosis. C5-6: Bulging annulus with flattening of the ventral thecal sac and mild narrowing of the ventral CSF space. There is also a shallow central disc protrusion without direct neural compression. No significant  foraminal stenosis. C6-7: Shallow central disc protrusion with mild impression on the ventral thecal sac and mild narrowing of the ventral CSF space. No foraminal stenosis. C7-T1: No significant findings. IMPRESSION: 1. Shallow disc protrusions at C5-6 and C6-7 with mild impression on the thecal sac but no significant neural compression or foraminal stenosis. 2. Mild annular bulge at C3-4. 3. Normal MR appearance of the cervical spinal cord.      Chronic hand pain (Left) (index finger) (Chronic)   Relevant Medications   nortriptyline (PAMELOR) 50 MG capsule   Chronic hand pain (Right) (middle finger) (Chronic)   Relevant Medications   nortriptyline (PAMELOR) 50 MG capsule   Chronic hip pain (Bilateral) (L>R) (Chronic)   Relevant Medications   nortriptyline (PAMELOR) 50 MG capsule   Other Relevant Orders   DG HIP UNILAT W OR W/O PELVIS 2-3 VIEWS LEFT   DG HIP UNILAT W OR W/O PELVIS 2-3 VIEWS RIGHT   Chronic knee pain (Location of Tertiary source of pain) (Bilateral) (L>R) (Chronic)   Relevant Medications   nortriptyline (PAMELOR) 50 MG capsule   Chronic low back pain (Bilateral) (midline) (L>R) (Chronic)   Relevant Orders   DG Lumbar Spine Complete W/Bend   Chronic neck pain (Location of Secondary source of pain) (Bilateral) (R>L) (Chronic)   Relevant Medications   nortriptyline (PAMELOR) 50 MG capsule   Chronic pain - Primary (Chronic)   Relevant Medications   nortriptyline (PAMELOR) 50 MG capsule   Other Relevant Orders   Comprehensive metabolic panel   C-reactive protein   Magnesium   Sedimentation rate   25-Hydroxyvitamin D Lcms D2+D3   Chronic shoulder pain (Location of Primary Source of Pain) (Bilateral) (L>R) (Chronic)   Relevant Orders   DG Shoulder Left   DG Shoulder Right   Chronic Shoulder Radicular Pain (Chronic)   Relevant Medications   nortriptyline (PAMELOR) 50 MG capsule   Disturbance of skin sensation (Chronic)   Relevant Orders   Vitamin B12      Medium   Encounter for pain management planning   Encounter for therapeutic drug level monitoring   Long term current use of opiate analgesic (Chronic)   Relevant Orders   Compliance Drug Analysis, Ur   Long  term prescription opiate use (Chronic)   Opiate use (Chronic)   Relevant Orders   Ambulatory referral to Psychology      Pharmacotherapy (Medications Ordered): No orders of the defined types were placed in this encounter.    Lab-work & Procedure Ordered: Orders Placed This Encounter  Procedures  . DG Lumbar Spine Complete W/Bend  . DG Shoulder Left  . DG Shoulder Right  . DG HIP UNILAT W OR W/O PELVIS 2-3 VIEWS LEFT  . DG HIP UNILAT W OR W/O PELVIS 2-3 VIEWS RIGHT  . Compliance Drug Analysis, Ur  . Comprehensive metabolic panel  . C-reactive protein  . Magnesium  . Sedimentation rate  . Vitamin B12  . 25-Hydroxyvitamin D Lcms D2+D3  . Ambulatory referral to Psychology    Imaging Ordered: AMB REFERRAL TO PSYCHOLOGY DG LUMBAR SPINE COMPLETE W/BEND 6+V DG SHOULDER LEFT DG SHOULDER RIGHT DG HIP UNILAT W OR W/O PELVIS 2-3 VIEWS LEFT DG HIP UNILAT W OR W/O PELVIS 2-3 VIEWS RIGHT  Interventional Therapies: Scheduled: None at this time. Considering:  1. Cervical epidural steroid injection under fluoroscopic guidance, with or without sedation.  2. Possible lumbar epidural steroid injection under fluoroscopic guidance, with or without sedation.  3. Possible intra-articular shoulder injections. Possible intra-articular knee injections.  4. Possible intra-articular hip injections.  5. Possible bilateral lumbar facet block under fluoroscopic guidance and IV sedation. PRN Procedures: None at this time.   Referral(s) or Consult(s): Medical psychology consult for substance use disorder evaluation  Medications administered during this visit: Ms. Hendy had no medications administered during this visit.  Prescriptions ordered during this visit: New Prescriptions   No  medications on file    Requested PM Follow-up: Return for Return after [facility] (SUD) Eval.  No future appointments.   Primary Care Physician: [facility] Location: [facility] Note by: Kathlen Brunswick. Dossie Arbour, M.D, DABA, DABAPM, DABPM, DABIPP, FIPP  Pain Score Disclaimer: We use the NRS-11 scale. This is a self-reported, subjective measurement of pain severity with only modest accuracy. It is used primarily to identify changes within a particular patient. It must be understood that outpatient pain scales are significantly less accurate that those used for research, where they can be applied under ideal controlled circumstances with minimal exposure to variables. In reality, the score is likely to be a combination of pain intensity and pain affect, where pain affect describes the degree of emotional arousal or changes in action readiness caused by the sensory experience of pain. Factors such as social and work situation, setting, emotional state, anxiety levels, expectation, and prior pain experience may influence pain perception and show large inter-individual differences that may also be affected by time variables.  Patient instructions provided during this appointment: Patient Instructions  Instructed to get labs drawn todayPain Management Discharge Instructions  General Discharge Instructions :  If you need to reach your doctor call: Monday-Friday 8:00 am - 4:00 pm at [phone] or toll free [phone].  After clinic hours [phone] to have operator reach doctor.  Bring all of your medication bottles to all your appointments in the pain clinic.  To cancel or reschedule your appointment with Pain Management please remember to call 24 hours in advance to avoid a fee.  Refer to the educational materials which you have been given on: General Risks, I had my Procedure. Discharge Instructions, Post Sedation.  Post Procedure  Instructions:  The drugs you were given will stay in your system until tomorrow, so for the next  24 hours you should not drive, make any legal decisions or drink any alcoholic beverages.  You may eat anything you prefer, but it is better to start with liquids then soups and crackers, and gradually work up to solid foods.  Please notify your doctor immediately if you have any unusual bleeding, trouble breathing or pain that is not related to your normal pain.  Depending on the type of procedure that was done, some parts of your body may feel week and/or numb.  This usually clears up by tonight or the next day.  Walk with the use of an assistive device or accompanied by an adult for the 24 hours.  You may use ice on the affected area for the first 24 hours.  Put ice in a Ziploc bag and cover with a towel and place against area 15 minutes on 15 minutes off.  You may switch to heat after 24 hours.

## 2015-10-15 ENCOUNTER — Ambulatory Visit
Admission: RE | Admit: 2015-10-15 | Discharge: 2015-10-15 | Disposition: A | Discharge status: Home / Self Care | Payer: Medicare Other | Source: Ambulatory Visit | Attending: Pain Medicine | Admitting: Pain Medicine

## 2015-10-15 ENCOUNTER — Other Ambulatory Visit
Admission: RE | Admit: 2015-10-15 | Discharge: 2015-10-15 | Disposition: A | Discharge status: Home / Self Care | Payer: Medicare Other | Source: Ambulatory Visit | Attending: Pain Medicine | Admitting: Pain Medicine

## 2015-10-15 DIAGNOSIS — G8929 Other chronic pain: Secondary | ICD-10-CM | POA: Insufficient documentation

## 2015-10-15 DIAGNOSIS — M545 Low back pain: Secondary | ICD-10-CM | POA: Insufficient documentation

## 2015-10-15 DIAGNOSIS — M25559 Pain in unspecified hip: Secondary | ICD-10-CM | POA: Insufficient documentation

## 2015-10-15 DIAGNOSIS — M25512 Pain in left shoulder: Secondary | ICD-10-CM

## 2015-10-15 DIAGNOSIS — R209 Unspecified disturbances of skin sensation: Secondary | ICD-10-CM

## 2015-10-15 DIAGNOSIS — M24111 Other articular cartilage disorders, right shoulder: Secondary | ICD-10-CM | POA: Insufficient documentation

## 2015-10-15 DIAGNOSIS — M25511 Pain in right shoulder: Secondary | ICD-10-CM

## 2015-10-15 DIAGNOSIS — M5136 Other intervertebral disc degeneration, lumbar region: Secondary | ICD-10-CM | POA: Insufficient documentation

## 2015-10-15 LAB — COMPREHENSIVE METABOLIC PANEL
ALK PHOS: 70 U/L (ref 38–126)
ALT: 15 U/L (ref 14–54)
AST: 16 U/L (ref 15–41)
Albumin: 4.1 g/dL (ref 3.5–5.0)
Anion gap: 7 (ref 5–15)
BILIRUBIN TOTAL: 0.4 mg/dL (ref 0.3–1.2)
BUN: 17 mg/dL (ref 6–20)
CALCIUM: 8.9 mg/dL (ref 8.9–10.3)
CO2: 26 mmol/L (ref 22–32)
CREATININE: 0.85 mg/dL (ref 0.44–1.00)
Chloride: 104 mmol/L (ref 101–111)
GFR calc Af Amer: >60 mL/min (ref >60)
GLUCOSE: 85 mg/dL (ref 65–99)
Potassium: 4.1 mmol/L (ref 3.5–5.1)
Sodium: 137 mmol/L (ref 135–145)
TOTAL PROTEIN: 7.7 g/dL (ref 6.5–8.1)

## 2015-10-15 LAB — C-REACTIVE PROTEIN: CRP: 0.6 mg/dL (ref <1.0)

## 2015-10-15 LAB — MAGNESIUM: Magnesium: 1.9 mg/dL (ref 1.7–2.4)

## 2015-10-15 LAB — SEDIMENTATION RATE: Sed Rate: 27 mm/hr — ABNORMAL HIGH (ref 0–20)

## 2015-10-15 LAB — VITAMIN B12: Vitamin B-12: 246 pg/mL (ref 180–914)

## 2015-10-16 ENCOUNTER — Telehealth: Payer: Self-pay | Admitting: *Deleted

## 2015-10-16 NOTE — Telephone Encounter (Signed)
Pt called lm on vm stating that Dr. Dossie Arbour told her to let him know when her blood work and x-ray was completed. It is completed per pt...td

## 2015-10-19 LAB — 25-HYDROXYVITAMIN D LCMS D2+D3: 25-HYDROXY, VITAMIN D-3: 29 ng/mL

## 2015-10-19 LAB — 25-HYDROXY VITAMIN D LCMS D2+D3: 25-Hydroxy, Vitamin D: 29 ng/mL — ABNORMAL LOW

## 2015-10-21 LAB — COMPLIANCE DRUG ANALYSIS, UR

## 2015-10-26 ENCOUNTER — Telehealth: Payer: Self-pay | Admitting: *Deleted

## 2015-10-26 NOTE — Telephone Encounter (Signed)
Pt called stating that she's had her blood work and x-rays completed and no one has called her back. Pt has questions? Thanks

## 2015-10-26 NOTE — Telephone Encounter (Signed)
Ana [name] spoke with patient and patient was given med psycologist number to get appointment.

## 2015-10-26 NOTE — Progress Notes (Signed)

## 2015-10-26 NOTE — Telephone Encounter (Signed)
Spoke with patient.  She is trying to get an appointment to return to see Dr Dossie Arbour.  She has had her labs and xrays done, but has not heard from the medical psycologist.  Can you check and make sure this referral was put in?  I told patient we would check and call her back to let her know what was going on?

## 2015-10-26 NOTE — Progress Notes (Signed)

## 2015-10-28 NOTE — Progress Notes (Signed)
Quick Note:  The results of this diagnostic imaging were reviewed and found to be: abnormal.  Based on these results, interventional pain management techniques may be of benefit.  In addition, the patient is a candidate for interventional pain management options. ______

## 2015-11-18 NOTE — Progress Notes (Signed)
Quick Note:  Results were reviewed and found to be: mildly abnormal   Based on these results: interventional pain management techniques may be of benefit  In addition: there appears to be no surgically correctable pathology ______

## 2015-11-23 ENCOUNTER — Encounter: Payer: Self-pay | Admitting: Pain Medicine

## 2015-11-23 ENCOUNTER — Ambulatory Visit: Payer: Medicare Other | Attending: Pain Medicine | Admitting: Pain Medicine

## 2015-11-23 VITALS — BP 155/84 | HR 70 | Temp 98.5°F | Resp 15 | Ht 62.0 in | Wt 180.0 lb

## 2015-11-23 DIAGNOSIS — M545 Low back pain, unspecified: Secondary | ICD-10-CM

## 2015-11-23 DIAGNOSIS — M25511 Pain in right shoulder: Secondary | ICD-10-CM | POA: Diagnosis not present

## 2015-11-23 DIAGNOSIS — R112 Nausea with vomiting, unspecified: Secondary | ICD-10-CM | POA: Diagnosis not present

## 2015-11-23 DIAGNOSIS — G8929 Other chronic pain: Secondary | ICD-10-CM | POA: Insufficient documentation

## 2015-11-23 DIAGNOSIS — Z5181 Encounter for therapeutic drug level monitoring: Secondary | ICD-10-CM | POA: Diagnosis not present

## 2015-11-23 DIAGNOSIS — Z79891 Long term (current) use of opiate analgesic: Secondary | ICD-10-CM | POA: Diagnosis not present

## 2015-11-23 DIAGNOSIS — R531 Weakness: Secondary | ICD-10-CM | POA: Insufficient documentation

## 2015-11-23 DIAGNOSIS — M79644 Pain in right finger(s): Secondary | ICD-10-CM | POA: Diagnosis not present

## 2015-11-23 DIAGNOSIS — R200 Anesthesia of skin: Secondary | ICD-10-CM | POA: Diagnosis not present

## 2015-11-23 DIAGNOSIS — M328 Other forms of systemic lupus erythematosus: Secondary | ICD-10-CM | POA: Diagnosis not present

## 2015-11-23 DIAGNOSIS — M7062 Trochanteric bursitis, left hip: Secondary | ICD-10-CM | POA: Diagnosis not present

## 2015-11-23 DIAGNOSIS — D61818 Other pancytopenia: Secondary | ICD-10-CM | POA: Diagnosis not present

## 2015-11-23 DIAGNOSIS — M542 Cervicalgia: Secondary | ICD-10-CM | POA: Insufficient documentation

## 2015-11-23 DIAGNOSIS — M25562 Pain in left knee: Secondary | ICD-10-CM | POA: Diagnosis not present

## 2015-11-23 DIAGNOSIS — M25559 Pain in unspecified hip: Secondary | ICD-10-CM

## 2015-11-23 DIAGNOSIS — M79642 Pain in left hand: Secondary | ICD-10-CM

## 2015-11-23 DIAGNOSIS — M5021 Other cervical disc displacement,  high cervical region: Secondary | ICD-10-CM | POA: Insufficient documentation

## 2015-11-23 DIAGNOSIS — M79645 Pain in left finger(s): Secondary | ICD-10-CM | POA: Diagnosis not present

## 2015-11-23 DIAGNOSIS — M5136 Other intervertebral disc degeneration, lumbar region: Secondary | ICD-10-CM | POA: Diagnosis not present

## 2015-11-23 DIAGNOSIS — Z79899 Other long term (current) drug therapy: Secondary | ICD-10-CM | POA: Diagnosis not present

## 2015-11-23 DIAGNOSIS — R29898 Other symptoms and signs involving the musculoskeletal system: Secondary | ICD-10-CM

## 2015-11-23 DIAGNOSIS — M79606 Pain in leg, unspecified: Secondary | ICD-10-CM | POA: Diagnosis present

## 2015-11-23 DIAGNOSIS — M25551 Pain in right hip: Secondary | ICD-10-CM | POA: Diagnosis not present

## 2015-11-23 DIAGNOSIS — M79641 Pain in right hand: Secondary | ICD-10-CM

## 2015-11-23 DIAGNOSIS — M25552 Pain in left hip: Secondary | ICD-10-CM | POA: Insufficient documentation

## 2015-11-23 DIAGNOSIS — M25512 Pain in left shoulder: Secondary | ICD-10-CM

## 2015-11-23 DIAGNOSIS — R937 Abnormal findings on diagnostic imaging of other parts of musculoskeletal system: Secondary | ICD-10-CM

## 2015-11-23 DIAGNOSIS — M25569 Pain in unspecified knee: Secondary | ICD-10-CM | POA: Diagnosis present

## 2015-11-23 DIAGNOSIS — I10 Essential (primary) hypertension: Secondary | ICD-10-CM | POA: Diagnosis not present

## 2015-11-23 DIAGNOSIS — M797 Fibromyalgia: Secondary | ICD-10-CM | POA: Diagnosis not present

## 2015-11-23 DIAGNOSIS — M25561 Pain in right knee: Secondary | ICD-10-CM | POA: Diagnosis not present

## 2015-11-23 MED ORDER — HYDROCODONE-ACETAMINOPHEN 5-325 MG PO TABS: 1.0000 | ORAL_TABLET | Freq: Four times a day (QID) | ORAL | Status: DC | PRN

## 2015-11-23 NOTE — Progress Notes (Signed)
Patient's Name: Ana [name]  Patient type: Established  MRN: [id number]  Service setting: Ambulatory outpatient  DOB: Mar 30, 1966  Location: [facility]  DOS: 11/23/2015  Primary Care Physician: [facility]  Note by: Beatriz Chancellor A. Dossie Arbour, M.D, DABA, DABAPM, DABPM, Milagros Evener, FIPP  Referring Physician: Katheren Shams  Specialty: Board-Certified Interventional Pain Management  Last Visit to Pain Management: 10/26/2015   Primary Reason(s) for Visit: Encounter for evaluation before starting new chronic pain management plan of care (Level of risk: moderate) CC: Leg Pain and Knee Pain   HPI  Ana [name] is a 50 y.o. year old, female patient, who returns today as an established patient. She has Left arm weakness; Chronic pain; Long term current use of opiate analgesic; Long term prescription opiate use; Opiate use; Encounter for therapeutic drug level monitoring; Encounter for pain management planning; Chronic shoulder pain (Location of Primary Source of Pain) (Bilateral) (L>R); Chronic Shoulder Radicular Pain; Chronic neck pain (Location of Secondary source of pain) (Bilateral) (R>L); Chronic knee pain (Location of Tertiary source of pain) (Bilateral) (L>R); Chronic hip pain (Bilateral) (L>R); Chronic low back pain (Bilateral) (midline) (L>R); Fibromyalgia; Disturbance of skin sensation; Disorder of hematopoietic structure; CFIDS (chronic fatigue and immune dysfunction syndrome); Clinical depression; Essential (primary) hypertension; Systemic lupus erythematosus ([place]); N&V (nausea and vomiting); Trochanteric bursitis of hip (Left); Neurosis, posttraumatic; Pancytopenia ([place]); Arthralgia of multiple joints; Breast pain; Low serum cobalamin; Cannot sleep; Chronic hand pain (Left) (index finger); Chronic hand pain (Right) (middle finger); and Abnormal MRI, cervical spine (08/07/2015) on her problem list.. Her primarily concern today is the Leg Pain and Knee Pain   Pain  Assessment: Self-Reported Pain Score: 8 , clinically she looks like a 2/10. Reported level is inconsistent with clinical obrservations Information on the proper use of the pain score provided to the patient today. Pain Type: Chronic pain Pain Location: Leg (left knee) Pain Orientation: Left Pain Descriptors / Indicators: Sharp Pain Frequency: Constant  The patient comes into the clinics today for post-procedure evaluation on the interventional treatment done on 10/13/2015. In addition, she comes in today for pharmacological management of her chronic pain.  The patient  reports that she does not use illicit drugs.   The patient indicates recently having started to have more problems with the left knee in the lateral aspect.  Date of Last Visit: 10/13/15 Service Provided on Last Visit: Evaluation (New patient)  Controlled Substance Pharmacotherapy Assessment & REMS (Risk Evaluation and Mitigation Strategy)  Analgesic: Hydrocodone/APAP 5/325 one every 6 hours (20 mg/day of hydrocodone) Pill Count: Did not have any medication (today we will be taken over her regiment.) MME/day: 20 mg/day Pharmacokinetics: Onset of action (Liberation/Absorption): Within expected pharmacological parameters Time to Peak effect (Distribution): Timing and results are as within normal expected parameters Duration of action (Metabolism/Excretion): Within normal limits for medication Pharmacodynamics: Analgesic Effect: Less than 50% Activity Facilitation: Medication(s) allow patient to sit, stand, walk, and do the basic ADLs Perceived Effectiveness: Described as relatively effective, allowing for increase in activities of daily living (ADL) Side-effects or Adverse reactions: None reported Monitoring: [place] PMP: Online review of the past 67-month period conducted. Compliant with practice rules and regulations Last UDS on record: SUMMARY  Date Value Ref Range Status  10/13/2015 FINAL  Final    Comment:     ==================================================================== TOX[facility]RE COMP DRUG ANALYSIS,UR ==================================================================== Test  Result       Flag       Units Drug Present and Declared for Prescription Verification   Nortriptyline                  PRESENT      EXPECTED    Nortriptyline may be administered as a prescription drug; it is    also an expected metabolite of amitriptyline.   Acetaminophen                  PRESENT      EXPECTED   Promethazine                   PRESENT      EXPECTED Drug Present not Declared for Prescription Verification   Oxcarbazepine MHD              PRESENT      UNEXPECTED    Oxcarbazepine MHD is the active metabolite of oxcarbazepine and    eslicarbazepine.   Baclofen                       PRESENT      UNEXPECTED   Trazodone                      PRESENT      UNEXPECTED   1,3 chlorophenyl piperazine    PRESENT      UNEXPECTED    1,3-chlorophenyl piperazine is an expected metabolite of    trazodone.   Salicylate                     PRESENT      UNEXPECTED Drug Absent but Declared for Prescription Verification   Hydrocodone                    Not Detected UNEXPECTED ng/mg creat ==================================================================== Test                      Result    Flag   Units      Ref Range   Creatinine              79               mg/dL      >=20 ==================================================================== Declared Medications:  The flagging and interpretation on this report are based on the  following declared medications.  Unexpected results may arise from  inaccuracies in the declared medications.  **Note: The testing scope of this panel includes these medications:  Hydrocodone (Norco)  Nortriptyline (Pamelor)  Promethazine (Phenergan)  **Note: The testing scope of this panel does not include small to  moderate amounts of these reported medications:   Acetaminophen (Norco)  **Note: The testing scope of this panel does not include following  reported medications:  Hydroxychloroquine (Plaquenil) ==================================================================== For clinical consultation, please call [phone]. ====================================================================    UDS interpretation: Compliant Medication Assessment Form: Reviewed. Patient indicates being compliant with therapy Treatment compliance: Compliant Risk Assessment: Aberrant Behavior: None observed today Substance Use Disorder (SUD) Risk Level: Low, according to the medical psychology evaluation. Risk of opioid abuse or dependence: 0.7-3.0% with doses ? 36 MME/day and 6.1-26% with doses ? 120 MME/day. Opioid Risk Tool (ORT) Score:  3 Low Risk for SUD (Score <3) Depression Scale Score: PHQ-2: PHQ-2 Total Score: 0 No depression (0) PHQ-9: PHQ-9 Total Score: 0 No depression (0-4)  Pharmacologic Plan:  No change in therapy, at this time  Previous Illicit Drug Screen Labs(s): Lab Results  Component Value Date   MDMA NEGATIVE 03/13/2012   COCAINSCRNUR NEGATIVE 03/13/2012   PCPSCRNUR NEGATIVE 03/13/2012   THCU NEGATIVE 03/13/2012    Laboratory Chemistry  Inflammation Markers Lab Results  Component Value Date   ESRSEDRATE 27* 10/15/2015   CRP 0.6 10/15/2015    Renal Function Lab Results  Component Value Date   BUN 17 10/15/2015   CREATININE 0.85 10/15/2015   GFRAA >60 10/15/2015   GFRNONAA >60 10/15/2015    Hepatic Function Lab Results  Component Value Date   AST 16 10/15/2015   ALT 15 10/15/2015   ALBUMIN 4.1 10/15/2015    Electrolytes Lab Results  Component Value Date   NA 137 10/15/2015   K 4.1 10/15/2015   CL 104 10/15/2015   CALCIUM 8.9 10/15/2015   MG 1.9 10/15/2015    Pain Modulating Vitamins Lab Results  Component Value Date   VITAMINB12 246 10/15/2015    Coagulation Parameters Lab Results  Component Value  Date   INR 0.9 05/04/2014   LABPROT 12.2 05/04/2014   PLT 173 08/07/2015    Note: Labs Reviewed. Results explained to patient in Layman's terms.  Recent Diagnostic Imaging  Cervical Imaging: Cervical MR wo contrast:  Results for orders placed during the hospital encounter of 08/06/15  MR Cervical Spine Wo Contrast   Narrative CLINICAL DATA:  One-week history of neck pain and shoulder and left arm pain.  EXAM: MRI CERVICAL SPINE WITHOUT CONTRAST  TECHNIQUE: Multiplanar, multisequence MR imaging of the cervical spine was performed. No intravenous contrast was administered.  COMPARISON:  Cervical spine CT scan 08/03/2015.  FINDINGS: Normal alignment of the cervical vertebral bodies. They demonstrate normal marrow signal. The cervical spinal cord demonstrates normal signal intensity. No cord lesions or syrinx. The facets are normally aligned. Mild multilevel facet disease.  C2-3:  No significant findings.  C3-4:  No significant findings.  C3-4: Mild annular bulge with slight flattening of the ventral thecal sac and mild narrowing of the ventral CSF space. No significant foraminal stenosis.  C5-6: Bulging annulus with flattening of the ventral thecal sac and mild narrowing of the ventral CSF space. There is also a shallow central disc protrusion without direct neural compression. No significant foraminal stenosis.  C6-7: Shallow central disc protrusion with mild impression on the ventral thecal sac and mild narrowing of the ventral CSF space. No foraminal stenosis.  C7-T1: No significant findings.  IMPRESSION: 1. Shallow disc protrusions at C5-6 and C6-7 with mild impression on the thecal sac but no significant neural compression or foraminal stenosis. 2. Mild annular bulge at C3-4. 3. Normal MR appearance of the cervical spinal cord.   Electronically Signed   By: Marijo Sanes M.D.   On: 08/07/2015 14:10    Cervical CT wo contrast:  Results for orders  placed during the hospital encounter of 08/03/15  CT Cervical Spine Wo Contrast   Narrative CLINICAL DATA:  50 year old with left neck and shoulder pain. Headache.  EXAM: CT HEAD WITHOUT CONTRAST  CT CERVICAL SPINE WITHOUT CONTRAST  TECHNIQUE: Multidetector CT imaging of the head and cervical spine was performed following the standard protocol without intravenous contrast. Multiplanar CT image reconstructions of the cervical spine were also generated.  COMPARISON:  CT orbits 01/07/2007  FINDINGS: CT HEAD FINDINGS  No evidence for acute hemorrhage, mass lesion, midline shift, hydrocephalus or large infarct. There is mild exophthalmos bilaterally and this is similar to  the previous CT. Visualized paranasal sinuses are clear. No calvarial fracture.  CT CERVICAL SPINE FINDINGS  Lung apices are clear without a pneumothorax. Negative for an acute fracture or dislocation. Small lymph nodes on both sides of the neck. There is no significant soft tissue swelling in the neck. Normal appearance of thyroid tissue. Normal alignment of the cervical spine. No significant disc space narrowing or facet disease. There are prominent periapical lucencies involving the right posterior molars. Evidence for dental caries.  IMPRESSION: No acute intracranial abnormality.  No acute bone abnormality in the cervical spine. No significant degenerative disease in cervical spine.  Odontogenic disease as described.  Stable mild exophthalmos.   Electronically Signed   By: Markus Daft M.D.   On: 08/03/2015 13:48    Cervical DG complete:  Results for orders placed in visit on 04/03/12  DG Cervical Spine Complete   Narrative * PRIOR REPORT IMPORTED FROM AN EXTERNAL SYSTEM *   PRIOR REPORT IMPORTED FROM THE [facility] WORKFLOW SYSTEM   REASON FOR EXAM:    generailized arthralgias myalgias lupus low back pain  lumbar shld. pain hip pain  COMMENTS:  bilateral joint/ bilateral hip pain   PROCEDURE:      DXR - DXR CERVICAL SPINE COMPLETE  - Apr 03 2012  3:17PM   RESULT:     Technique: A 5 view cervical spine series was obtained.   Findings: There is no evidence of fracture, dislocation nor malalignment.  There is no evidence of prevertebral soft tissue swelling.   IMPRESSION:      No evidence of acute osseous abnormalities.       Shoulder Imaging: Shoulder-L DG:  Results for orders placed during the hospital encounter of 10/15/15  DG Shoulder Left   Narrative CLINICAL DATA:  Chronic low back, BILATERAL hip, and BILATERAL shoulder pain, history fibromyalgia and multiple falls  EXAM: LEFT SHOULDER - 2+ VIEW  COMPARISON:  None  FINDINGS: Osseous demineralization.  AC joint alignment normal.  No glenohumeral fracture or dislocation.  Visualized LEFT ribs intact.  IMPRESSION: No acute abnormalities.   Electronically Signed   By: Lavonia Dana M.D.   On: 10/15/2015 11:28    Lumbosacral Imaging: Lumbar MR wo contrast:  Results for orders placed in visit on 02/28/06  MR L Spine Ltd W/O Cm   Narrative * PRIOR REPORT IMPORTED FROM AN EXTERNAL SYSTEM *   PRIOR REPORT IMPORTED FROM THE [facility] WORKFLOW SYSTEM   REASON FOR EXAM:    low back pain  COMMENTS:   PROCEDURE:     MR  - MR LUMBAR SPINE WO CONTRAST  - Feb 28 2006  1:38PM   RESULT:     The conus medullaris terminates at an L1-2 level.  The cauda  equina demonstrate no evidence of clumping nor thickening.   At the L5-S1 disc space level a mild central disc protrusion is  appreciated  without evidence of significant thecal sac stenosis, nerve root  compression  nor compromise.  No further evidence of disc herniations, bulges or  protrusions, thecal sac stenosis, nerve root compression or compromise  appreciated.   IMPRESSION:   1)Mild central disc protrusion at L5-S1 as described above, otherwise no  evidence of thecal sac stenosis, nerve root compression or compromise.   Thank you for the opportunity to  contribute to the care of your patient.       Lumbar DG 1V:  Results for orders placed in visit on 07/20/05  DG Lumbar Spine 1 View  Narrative * PRIOR REPORT IMPORTED FROM AN EXTERNAL SYSTEM *   PRIOR REPORT IMPORTED FROM THE [facility] WORKFLOW SYSTEM   REASON FOR EXAM:  Cross-table lateral. Patient in room 14  COMMENTS:   PROCEDURE:     DXR - DXR LUMBAR SPINE ONE VIEW ONLY  - Jul 20 2005  6:42PM   RESULT:     A cross-table lateral view of the lumbar spine shows no  fracture  or abnormal vertebral body subluxation.   IMPRESSION:   No acute changes are identified.   Thank you for this opportunity to contribute to the care of your patient.       Lumbar DG 2-3 views:  Results for orders placed in visit on 02/24/09  DG Lumbar Spine 2-3 Views   Narrative * PRIOR REPORT IMPORTED FROM AN EXTERNAL SYSTEM *   PRIOR REPORT IMPORTED FROM THE [facility] [place] EXAM:    low back pain  COMMENTS:   PROCEDURE:     KDR - KDXR LUMBAR SPINE AP AND LATERAL  - Feb 24 2009  12:03PM   RESULT:     Comparison is made to study of 24 February 2009.   The lumbar vertebral bodies are preserved in height. The intervertebral  disc  space heights are well maintained. The posterior elements are grossly  intact. The observed portions of the sacrum appear normal.   IMPRESSION:      I do not see definite evidence of acute abnormality of  the  lumbar spine. Followup MRI may be useful if there are radicular symptoms.       Lumbar DG Bending views:  Results for orders placed during the hospital encounter of 10/15/15  DG Lumbar Spine Complete W/Bend   Narrative CLINICAL DATA:  Chronic low back pain, history of multiple falls, bilateral hip pain, history of fibromyalgia.  EXAM: LUMBAR SPINE - COMPLETE WITH BENDING VIEWS  COMPARISON:  Lumbar spine lateral views dated April 03, 2012 and AP and lateral views dated February 24, 2009  FINDINGS: The twelfth ribs are hypoplastic.  The lumbar vertebral bodies are preserved in height. There is mild disc space narrowing at L4-5. There is minimal (approximately 3-4 mm) anterolisthesis of L4 with respect L5. No pars defects are observed. There is no significant facet joint hypertrophy.  IMPRESSION: Mild disc space narrowing at L4-5 with grade 1 anterolisthesis of L4 with respect L5 as the patient moves from the extended and neutral positions to the flexed position. This appears to be on the basis of degenerative disc disease. No pars defect is demonstrated. The lumbar spine is otherwise unremarkable.   Electronically Signed   By: David  Martinique M.D.   On: 10/15/2015 11:32    Sacroiliac Joint Imaging: Sacroiliac Joint DG:  Results for orders placed in visit on 04/03/12  DG Si Joints   Narrative * PRIOR REPORT IMPORTED FROM AN EXTERNAL SYSTEM *   PRIOR REPORT IMPORTED FROM THE [facility] WORKFLOW SYSTEM   REASON FOR EXAM:    generailized arthralgias myalgias lupus low back pain  lumbar shld. pain hip pain  COMMENTS:   PROCEDURE:     DXR - DXR SACROILIAC JOINTS  - Apr 03 2012  3:17PM   RESULT:     There is no evidence of fracture, dislocation, or  malalignment.   IMPRESSION:   1. No evidence of acute abnormalities.  2. If there are persistent complaints of pain or persistent clinical  concern, a repeat evaluation in 7-10 days is  recommended if clinically  warranted.   Thank you for the opportunity to contribute to the care of your patient.       Hip Imaging: Hip-R DG 2-3 views:  Results for orders placed during the hospital encounter of 10/15/15  DG HIP UNILAT W OR W/O PELVIS 2-3 VIEWS RIGHT   Narrative CLINICAL DATA:  Chronic low back, BILATERAL hip, and BILATERAL shoulder pain, history fibromyalgia and multiple falls  EXAM: DG HIP (WITH OR WITHOUT PELVIS) 2-3V RIGHT  COMPARISON:  None  FINDINGS: RIGHT hip and SI joint space preserved.  No acute fracture, dislocation or bone  destruction.  Tiny RIGHT pelvic phlebolith noted.  IMPRESSION: Normal exam.   Electronically Signed   By: Lavonia Dana M.D.   On: 10/15/2015 11:31    Hip-L DG 2-3 views:  Results for orders placed during the hospital encounter of 10/15/15  DG HIP UNILAT W OR W/O PELVIS 2-3 VIEWS LEFT   Narrative CLINICAL DATA:  Chronic low back, BILATERAL hip, and BILATERAL shoulder pain, history fibromyalgia and multiple falls  EXAM: DG HIP (WITH OR WITHOUT PELVIS) 2-3V LEFT  COMPARISON:  None  FINDINGS: Symmetric hip and SI joints.  Osseous mineralization appears low normal.  No acute fracture, dislocation, or bone destruction.  IMPRESSION: No acute abnormalities.   Electronically Signed   By: Lavonia Dana M.D.   On: 10/15/2015 11:30    Note: Imaging reviewed. Results explained to patient in Layman's terms.  Meds  The patient has a current medication list which includes the following prescription(s): hydrochlorothiazide, hydroxychloroquine, nortriptyline, promethazine, hydrocodone-acetaminophen, and hydrocodone-acetaminophen.  Current Outpatient Prescriptions on File Prior to Visit  Medication Sig  . hydroxychloroquine (PLAQUENIL) 200 MG tablet Take by mouth 2 (two) times daily.  . nortriptyline (PAMELOR) 50 MG capsule Take 50 mg by mouth at bedtime.  . promethazine (PHENERGAN) 50 MG tablet Take 0.5 tablets (25 mg total) by mouth every 6 (six) hours as needed for nausea or vomiting.   No current facility-administered medications on file prior to visit.    ROS  Constitutional: Denies any fever or chills Gastrointestinal: No reported hemesis, hematochezia, vomiting, or acute GI distress Musculoskeletal: Denies any acute onset joint swelling, redness, loss of ROM, or weakness Neurological: No reported episodes of acute onset apraxia, aphasia, dysarthria, agnosia, amnesia, paralysis, loss of coordination, or loss of consciousness  Allergies  Ana [name] is allergic to  mirtazapine.  [place]  Medical:  Ana [name]  has a past medical history of Lupus ([place]) (1990); Fibromyalgia; Hypertension; and Lupus ([place]) (08/07/2015). Family: family history includes Diabetes in her mother; Testicular cancer in her father. Surgical:  has past surgical history that includes Cholecystectomy; right ankle surgery; Cesarean section; Abdominal hysterectomy; and Ankle surgery (Right). Tobacco:  reports that she has never smoked. She does not have any smokeless tobacco history on file. Alcohol:  reports that she drinks about 1.2 oz of alcohol per week. Drug:  reports that she does not use illicit drugs.  Constitutional Exam  Vitals: Blood pressure 155/84, pulse 70, temperature 98.5 F (36.9 C), temperature source Oral, resp. rate 15, height 5\' 2"  (1.575 m), weight 180 lb (81.647 kg), SpO2 100 %. General appearance: Well nourished, well developed, and well hydrated. In no acute distress Calculated BMI/Body habitus: Body mass index is 32.91 kg/(m^2). (30-34.9 kg/m2) Obese (Class I) - 68% higher incidence of chronic pain Psych/Mental status: Alert and oriented x 3 (person, place, & time) Eyes: PERLA Respiratory: No evidence of acute respiratory distress  Cervical Spine  Exam  Inspection: No masses, redness, or swelling Alignment: Symmetrical ROM: Functional: Decreased ROM Stability: No instability detected Muscle strength & Tone: Functionally intact Sensory: Unimpaired Palpation: No complaints of tenderness  Upper Extremity (UE) Exam    Side: Right upper extremity  Side: Left upper extremity  Inspection: No masses, redness, swelling, or asymmetry  Inspection: No masses, redness, swelling, or asymmetry  ROM:  ROM:  Functional: ROM is within functional limits [facility])  Functional: ROM is within functional limits [facility])  Muscle strength & Tone: Functionally intact  Muscle strength & Tone: Functionally intact  Sensory: Unimpaired  Sensory: Unimpaired  Palpation: Non-contributory   Palpation: Non-contributory   Thoracic Spine Exam  Inspection: No masses, redness, or swelling Alignment: Symmetrical ROM: Functional: ROM is within functional limits [facility]) Stability: No instability detected Sensory: Unimpaired Muscle strength & Tone: Functionally intact Palpation: No complaints of tenderness  Lumbar Spine Exam  Inspection: No masses, redness, or swelling Alignment: Symmetrical ROM: Functional: ROM is within functional limits [facility]) Stability: No instability detected Muscle strength & Tone: Functionally intact Sensory: Unimpaired Palpation: No complaints of tenderness Provocative Tests: Lumbar Hyperextension and rotation test: Positive for bilateral lumbar facet pain Patrick's Maneuver: deferred  Gait & Posture Assessment  Ambulation: Unassisted Gait: Unaffected Posture: WNL  Lower Extremity Exam    Side: Right lower extremity  Side: Left lower extremity  Inspection: No masses, redness, swelling, or asymmetry ROM:  Inspection: No masses, redness, swelling, or asymmetry ROM:  Functional: ROM is within functional limits [facility])  Functional: ROM is within functional limits [facility])  Muscle strength & Tone: Functionally intact  Muscle strength & Tone: Functionally intact  Sensory: Unimpaired  Sensory: Unimpaired  Palpation: Non-contributory  Palpation: Non-contributory   Assessment & Plan  Primary Diagnosis & Pertinent Problem List: The primary encounter diagnosis was Chronic pain. Diagnoses of Long term current use of opiate analgesic, Long term prescription opiate use, Encounter for therapeutic drug level monitoring, Chronic shoulder pain (Location of Primary Source of Pain) (Bilateral) (L>R), Chronic neck pain (Location of Secondary source of pain) (Bilateral) (R>L), Chronic hand pain (Right) (middle finger), Chronic hand pain (Left) (index finger), Abnormal MRI, cervical spine (08/07/2015), Left arm weakness, Chronic hip pain, unspecified laterality, Chronic knee  pain (Location of Tertiary source of pain) (Bilateral) (L>R), Chronic low back pain (Bilateral) (midline) (L>R), and Trochanteric bursitis of hip (Left) were also pertinent to this visit.  Visit Diagnosis: 1. Chronic pain   2. Long term current use of opiate analgesic   3. Long term prescription opiate use   4. Encounter for therapeutic drug level monitoring   5. Chronic shoulder pain (Location of Primary Source of Pain) (Bilateral) (L>R)   6. Chronic neck pain (Location of Secondary source of pain) (Bilateral) (R>L)   7. Chronic hand pain (Right) (middle finger)   8. Chronic hand pain (Left) (index finger)   9. Abnormal MRI, cervical spine (08/07/2015)   10. Left arm weakness   11. Chronic hip pain, unspecified laterality   12. Chronic knee pain (Location of Tertiary source of pain) (Bilateral) (L>R)   13. Chronic low back pain (Bilateral) (midline) (L>R)   14. Trochanteric bursitis of hip (Left)     Problems updated and reviewed during this visit: No problems updated.  Problem-specific Plan(s): No problem-specific assessment & plan notes found for this encounter.  No new assessment & plan notes have been filed under this hospital service since the last note was generated. Service: Pain Management   Plan of Care   Problem  List Items Addressed This Visit      High   Abnormal MRI, cervical spine (08/07/2015)   Chronic hand pain (Left) (index finger) (Chronic)   Relevant Medications   HYDROcodone-acetaminophen (NORCO/VICODIN) 5-325 MG tablet   HYDROcodone-acetaminophen (NORCO/VICODIN) 5-325 MG tablet   Chronic hand pain (Right) (middle finger) (Chronic)   Relevant Medications   HYDROcodone-acetaminophen (NORCO/VICODIN) 5-325 MG tablet   HYDROcodone-acetaminophen (NORCO/VICODIN) 5-325 MG tablet   Chronic hip pain (Bilateral) (L>R) (Chronic)   Relevant Medications   HYDROcodone-acetaminophen (NORCO/VICODIN) 5-325 MG tablet   HYDROcodone-acetaminophen (NORCO/VICODIN) 5-325 MG  tablet   Other Relevant Orders   HIP INJECTION   Chronic knee pain (Location of Tertiary source of pain) (Bilateral) (L>R) (Chronic)   Relevant Medications   HYDROcodone-acetaminophen (NORCO/VICODIN) 5-325 MG tablet   HYDROcodone-acetaminophen (NORCO/VICODIN) 5-325 MG tablet   Other Relevant Orders   KNEE INJECTION   GENICULAR NERVE BLOCK   Chronic low back pain (Bilateral) (midline) (L>R) (Chronic)   Relevant Medications   HYDROcodone-acetaminophen (NORCO/VICODIN) 5-325 MG tablet   HYDROcodone-acetaminophen (NORCO/VICODIN) 5-325 MG tablet   Other Relevant Orders   LUMBAR EPIDURAL STEROID INJECTION   LUMBAR FACET(MEDIAL BRANCH NERVE BLOCK) MBNB   Chronic neck pain (Location of Secondary source of pain) (Bilateral) (R>L) (Chronic)   Relevant Medications   HYDROcodone-acetaminophen (NORCO/VICODIN) 5-325 MG tablet   HYDROcodone-acetaminophen (NORCO/VICODIN) 5-325 MG tablet   Other Relevant Orders   CERVICAL FACET (MEDIAL BRANCH NERVE BLOCK)    Chronic pain - Primary (Chronic)   Relevant Medications   HYDROcodone-acetaminophen (NORCO/VICODIN) 5-325 MG tablet   HYDROcodone-acetaminophen (NORCO/VICODIN) 5-325 MG tablet   Chronic shoulder pain (Location of Primary Source of Pain) (Bilateral) (L>R) (Chronic)   Relevant Orders   SHOULDER INJECTION   Left arm weakness   Relevant Orders   CERVICAL EPIDURAL STEROID INJECTION   Trochanteric bursitis of hip (Left) (Chronic)   Relevant Orders   HIP INJECTION     Medium   Encounter for therapeutic drug level monitoring   Long term current use of opiate analgesic (Chronic)   Long term prescription opiate use (Chronic)       Pharmacotherapy (Medications Ordered): Meds ordered this encounter  Medications  . HYDROcodone-acetaminophen (NORCO/VICODIN) 5-325 MG tablet    Sig: Take 1 tablet by mouth every 6 (six) hours as needed for moderate pain.    Dispense:  120 tablet    Refill:  0    Do not place this medication, or any other  prescription from our practice, on "Automatic Refill". Patient may have prescription filled one day early if pharmacy is closed on scheduled refill date. Do not fill until: 11/23/15 To last until: 12/23/15  . HYDROcodone-acetaminophen (NORCO/VICODIN) 5-325 MG tablet    Sig: Take 1 tablet by mouth every 6 (six) hours as needed for severe pain.    Dispense:  120 tablet    Refill:  0    Do not place this medication, or any other prescription from our practice, on "Automatic Refill". Patient may have prescription filled one day early if pharmacy is closed on scheduled refill date. Do not fill until: 12/23/15 To last until: 01/22/16    [facility] Procedure Ordered: Orders Placed This Encounter  Procedures  . CERVICAL EPIDURAL STEROID INJECTION  . CERVICAL FACET (MEDIAL BRANCH NERVE BLOCK)   . LUMBAR EPIDURAL STEROID INJECTION  . LUMBAR FACET(MEDIAL BRANCH NERVE BLOCK) MBNB  . HIP INJECTION  . SHOULDER INJECTION  . KNEE INJECTION  . GENICULAR NERVE BLOCK    Imaging Ordered: None  Interventional Therapies: Scheduled:  Diagnostic right-sided cervical epidural steroid injection under fluoroscopic guidance and IV sedation.    Considering:    Diagnostic right-sided cervical epidural steroid injection under fluoroscopic guidance and IV sedation.   Diagnostic bilateral cervical facet block under fluoroscopic guidance and IV sedation.   Possible bilateral cervical facet radiofrequency ablation depending on the results of the diagnostic injection.   Diagnostic lumbar epidural steroid injection under fluoroscopic guidance, with or without sedation.   Diagnostic intra-articular shoulder injections.   Possible intra-articular knee injections.   Diagnostic intra-articular hip injections.   Diagnostic bilateral lumbar facet block under fluoroscopic guidance and IV sedation.  Possible bilateral cervical facet radiofrequency ablation depending on the results of the diagnostic injection.     PRN Procedures:    Diagnostic right-sided cervical epidural steroid injection under fluoroscopic guidance and IV sedation.   Diagnostic bilateral cervical facet block under fluoroscopic guidance and IV sedation.   Diagnostic lumbar epidural steroid injection under fluoroscopic guidance, with or without sedation.   Diagnostic intra-articular shoulder injections.   Possible intra-articular knee injections.   Diagnostic intra-articular hip injections.   Diagnostic bilateral lumbar facet block under fluoroscopic guidance and IV sedation.   Referral(s) or Consult(s): None at this time.  New Prescriptions   HYDROCODONE-ACETAMINOPHEN (NORCO/VICODIN) 5-325 MG TABLET    Take 1 tablet by mouth every 6 (six) hours as needed for moderate pain.   HYDROCODONE-ACETAMINOPHEN (NORCO/VICODIN) 5-325 MG TABLET    Take 1 tablet by mouth every 6 (six) hours as needed for severe pain.    Medications administered during this visit: Ana [name] had no medications administered during this visit.  Requested PM Follow-up: Return in about 7 weeks (around 01/11/2016) for Procedure (Scheduled), Medication Management, (1-Mo).  Future Appointments Date Time Provider [place]  12/10/2015 1:30 PM Milinda Pointer, MD [facility] None  01/11/2016 8:20 AM Milinda Pointer, MD [facility] None    Primary Care Physician: [facility] Location: [facility] Outpatient Pain Management Facility Note by: Kathlen Brunswick. Dossie Arbour, M.D, DABA, DABAPM, DABPM, DABIPP, FIPP  Pain Score Disclaimer: We use the NRS-11 scale. This is a self-reported, subjective measurement of pain severity with only modest accuracy. It is used primarily to identify changes within a particular patient. It must be understood that outpatient pain scales are significantly less accurate that those used for research, where they can be applied under ideal controlled circumstances with minimal exposure to variables. In reality, the score is likely  to be a combination of pain intensity and pain affect, where pain affect describes the degree of emotional arousal or changes in action readiness caused by the sensory experience of pain. Factors such as social and work situation, setting, emotional state, anxiety levels, expectation, and prior pain experience may influence pain perception and show large inter-individual differences that may also be affected by time variables.  Patient instructions provided during this appointment: Patient Instructions   GENERAL RISKS AND COMPLICATIONS  What are the risk, side effects and possible complications? Generally speaking, most procedures are safe.  However, with any procedure there are risks, side effects, and the possibility of complications.  The risks and complications are dependent upon the sites that are lesioned, or the type of nerve block to be performed.  The closer the procedure is to the spine, the more serious the risks are.  Great care is taken when placing the radio frequency needles, block needles or lesioning probes, but sometimes complications can occur.  Infection: Any time there is an injection through the  skin, there is a risk of infection.  This is why sterile conditions are used for these blocks.  There are four possible types of infection.  Localized skin infection.  Central Nervous System Infection-This can be in the form of Meningitis, which can be deadly.  Epidural Infections-This can be in the form of an epidural abscess, which can cause pressure inside of the spine, causing compression of the spinal cord with subsequent paralysis. This would require an emergency surgery to decompress, and there are no guarantees that the patient would recover from the paralysis.  Discitis-This is an infection of the intervertebral discs.  It occurs in about 1% of discography procedures.  It is difficult to treat and it may lead to surgery.        2. Pain: the needles have to go through skin  and soft tissues, will cause soreness.       3. Damage to internal structures:  The nerves to be lesioned may be near blood vessels or    other nerves which can be potentially damaged.       4. Bleeding: Bleeding is more common if the patient is taking blood thinners such as  aspirin, Coumadin, Ticiid, Plavix, etc., or if he/she have some genetic predisposition  such as hemophilia. Bleeding into the spinal canal can cause compression of the spinal  cord with subsequent paralysis.  This would require an emergency surgery to  decompress and there are no guarantees that the patient would recover from the  paralysis.       5. Pneumothorax:  Puncturing of a lung is a possibility, every time a needle is introduced in  the area of the chest or upper back.  Pneumothorax refers to free air around the  collapsed lung(s), inside of the thoracic cavity (chest cavity).  Another two possible  complications related to a similar event would include: Hemothorax and Chylothorax.   These are variations of the Pneumothorax, where instead of air around the collapsed  lung(s), you may have blood or chyle, respectively.       6. Spinal headaches: They may occur with any procedures in the area of the spine.       7. Persistent CSF (Cerebro-Spinal Fluid) leakage: This is a rare problem, but may occur  with prolonged intrathecal or epidural catheters either due to the formation of a fistulous  track or a dural tear.       8. Nerve damage: By working so close to the spinal cord, there is always a possibility of  nerve damage, which could be as serious as a permanent spinal cord injury with  paralysis.       9. Death:  Although rare, severe deadly allergic reactions known as "Anaphylactic  reaction" can occur to any of the medications used.      10. Worsening of the symptoms:  We can always make thing worse.  What are the chances of something like this happening? Chances of any of this occuring are extremely low.  By statistics,  you have more of a chance of getting killed in a motor vehicle accident: while driving to the hospital than any of the above occurring .  Nevertheless, you should be aware that they are possibilities.  In general, it is similar to taking a shower.  Everybody knows that you can slip, hit your head and get killed.  Does that mean that you should not shower again?  Nevertheless always keep in mind that statistics do not mean  anything if you happen to be on the wrong side of them.  Even if a procedure has a 1 (one) in a 1,000,000 (million) chance of going wrong, it you happen to be that one..Also, keep in mind that by statistics, you have more of a chance of having something go wrong when taking medications.  Who should not have this procedure? If you are on a blood thinning medication (e.g. Coumadin, Plavix, see list of "Blood Thinners"), or if you have an active infection going on, you should not have the procedure.  If you are taking any blood thinners, please inform your physician.  How should I prepare for this procedure?  Do not eat or drink anything at least six hours prior to the procedure.  Bring a driver with you .  It cannot be a taxi.  Come accompanied by an adult that can drive you back, and that is strong enough to help you if your legs get weak or numb from the local anesthetic.  Take all of your medicines the morning of the procedure with just enough water to swallow them.  If you have diabetes, make sure that you are scheduled to have your procedure done first thing in the morning, whenever possible.  If you have diabetes, take only half of your insulin dose and notify our nurse that you have done so as soon as you arrive at the clinic.  If you are diabetic, but only take blood sugar pills (oral hypoglycemic), then do not take them on the morning of your procedure.  You may take them after you have had the procedure.  Do not take aspirin or any aspirin-containing medications, at  least eleven (11) days prior to the procedure.  They may prolong bleeding.  Wear loose fitting clothing that may be easy to take off and that you would not mind if it got stained with Betadine or blood.  Do not wear any jewelry or perfume  Remove any nail coloring.  It will interfere with some of our monitoring equipment.  NOTE: Remember that this is not meant to be interpreted as a complete list of all possible complications.  Unforeseen problems may occur.  BLOOD THINNERS The following drugs contain aspirin or other products, which can cause increased bleeding during surgery and should not be taken for 2 weeks prior to and 1 week after surgery.  If you should need take something for relief of minor pain, you may take acetaminophen which is found in Tylenol,m Datril, Anacin-3 and Panadol. It is not blood thinner. The products listed below are.  Do not take any of the products listed below in addition to any listed on your instruction sheet.  A.P.C or A.P.C with Codeine Codeine Phosphate Capsules #3 Ibuprofen Ridaura  ABC compound Congesprin Imuran rimadil  Advil Cope Indocin Robaxisal  Alka-Seltzer Effervescent Pain Reliever and Antacid Coricidin or Coricidin-D  Indomethacin Rufen  Alka-Seltzer plus Cold Medicine Cosprin Ketoprofen S-A-C Tablets  Anacin Analgesic Tablets or Capsules Coumadin Korlgesic Salflex  Anacin Extra Strength Analgesic tablets or capsules CP-2 Tablets Lanoril Salicylate  Anaprox Cuprimine Capsules Levenox Salocol  Anexsia-D Dalteparin Magan Salsalate  Anodynos Darvon compound Magnesium Salicylate Sine-off  Ansaid Dasin Capsules Magsal Sodium Salicylate  Anturane Depen Capsules Marnal Soma  APF Arthritis pain formula Dewitt's Pills Measurin Stanback  Argesic Dia-Gesic Meclofenamic Sulfinpyrazone  Arthritis Bayer Timed Release Aspirin Diclofenac Meclomen Sulindac  Arthritis pain formula Anacin Dicumarol Medipren Supac  Analgesic (Safety coated) Arthralgen  Diffunasal Mefanamic Suprofen  Arthritis  Strength Bufferin Dihydrocodeine Mepro Compound Suprol  Arthropan liquid Dopirydamole Methcarbomol with Aspirin Synalgos  ASA tablets/Enseals Disalcid Micrainin Tagament  Ascriptin Doan's Midol Talwin  Ascriptin A/D Dolene Mobidin Tanderil  Ascriptin Extra Strength Dolobid Moblgesic Ticlid  Ascriptin with Codeine Doloprin or Doloprin with Codeine Momentum Tolectin  Asperbuf Duoprin Mono-gesic Trendar  Aspergum Duradyne Motrin or Motrin IB Triminicin  Aspirin plain, buffered or enteric coated Durasal Myochrisine Trigesic  Aspirin Suppositories Easprin Nalfon Trillsate  Aspirin with Codeine Ecotrin Regular or Extra Strength Naprosyn Uracel  Atromid-S Efficin Naproxen Ursinus  Auranofin Capsules Elmiron Neocylate Vanquish  Axotal Emagrin Norgesic Verin  Azathioprine Empirin or Empirin with Codeine Normiflo Vitamin E  Azolid Emprazil Nuprin Voltaren  Bayer Aspirin plain, buffered or children's or timed BC Tablets or powders Encaprin Orgaran Warfarin Sodium  Buff-a-Comp Enoxaparin Orudis Zorpin  Buff-a-Comp with Codeine Equegesic Os-Cal-Gesic   Buffaprin Excedrin plain, buffered or Extra Strength Oxalid   Bufferin Arthritis Strength Feldene Oxphenbutazone   Bufferin plain or Extra Strength Feldene Capsules Oxycodone with Aspirin   Bufferin with Codeine Fenoprofen Fenoprofen Pabalate or Pabalate-SF   Buffets II Flogesic Panagesic   Buffinol plain or Extra Strength Florinal or Florinal with Codeine Panwarfarin   Buf-Tabs Flurbiprofen Penicillamine   Butalbital Compound Four-way cold tablets Penicillin   Butazolidin Fragmin Pepto-Bismol   Carbenicillin Geminisyn Percodan   Carna Arthritis Reliever Geopen Persantine   Carprofen Gold's salt Persistin   Chloramphenicol Goody's Phenylbutazone   Chloromycetin Haltrain Piroxlcam   Clmetidine heparin Plaquenil   Cllnoril Hyco-pap Ponstel   Clofibrate Hydroxy chloroquine Propoxyphen         Before  stopping any of these medications, be sure to consult the physician who ordered them.  Some, such as Coumadin (Warfarin) are ordered to prevent or treat serious conditions such as "deep thrombosis", "pumonary embolisms", and other heart problems.  The amount of time that you may need off of the medication may also vary with the medication and the reason for which you were taking it.  If you are taking any of these medications, please make sure you notify your pain physician before you undergo any procedures.         Epidural Steroid Injection Patient Information  Description: The epidural space surrounds the nerves as they exit the spinal cord.  In some patients, the nerves can be compressed and inflamed by a bulging disc or a tight spinal canal (spinal stenosis).  By injecting steroids into the epidural space, we can bring irritated nerves into direct contact with a potentially helpful medication.  These steroids act directly on the irritated nerves and can reduce swelling and inflammation which often leads to decreased pain.  Epidural steroids may be injected anywhere along the spine and from the neck to the low back depending upon the location of your pain.   After numbing the skin with local anesthetic (like Novocaine), a small needle is passed into the epidural space slowly.  You may experience a sensation of pressure while this is being done.  The entire block usually last less than 10 minutes.  Conditions which may be treated by epidural steroids:   Low back and leg pain  Neck and arm pain  Spinal stenosis  Post-laminectomy syndrome  Herpes zoster (shingles) pain  Pain from compression fractures  Preparation for the injection:   Do not eat any solid food or dairy products within 8 hours of your appointment.   You may drink clear liquids up to 3 hours before appointment.  Clear  liquids include water, black coffee, juice or soda.  No milk or cream please.  You may take your  regular medication, including pain medications, with a sip of water before your appointment  Diabetics should hold regular insulin (if taken separately) and take 1/2 normal NPH dos the morning of the procedure.  Carry some sugar containing items with you to your appointment.  A driver must accompany you and be prepared to drive you home after your procedure.   Bring all your current medications with your.  An IV may be inserted and sedation may be given at the discretion of the physician.    A blood pressure cuff, EKG and other monitors will often be applied during the procedure.  Some patients may need to have extra oxygen administered for a short period.  You will be asked to provide medical information, including your allergies, prior to the procedure.  We must know immediately if you are taking blood thinners (like Coumadin/Warfarin)  Or if you are allergic to IV iodine contrast (dye). We must know if you could possible be pregnant.  Possible side-effects:  Bleeding from needle site  Infection (rare, may require surgery)  Nerve injury (rare)  Numbness & tingling (temporary)  Difficulty urinating (rare, temporary)  Spinal headache ( a headache worse with upright posture)  Light -headedness (temporary)  Pain at injection site (several days)  Decreased blood pressure (temporary)  Weakness in arm/leg (temporary)  Pressure sensation in back/neck (temporary)  Call if you experience:  Fever/chills associated with headache or increased back/neck pain.  Headache worsened by an upright position.  New onset weakness or numbness of an extremity below the injection site  Hives or difficulty breathing (go to the emergency room)  Inflammation or drainage at the infection site  Severe back/neck pain  Any new symptoms which are concerning to you  Please note:  Although the local anesthetic injected can often make your back or neck feel good for several hours after the  injection, the pain will likely return.  It takes 3-7 days for steroids to work in the epidural space.  You may not notice any pain relief for at least that one week.  If effective, we will often do a series of three injections spaced 3-6 weeks apart to maximally decrease your pain.  After the initial series, we generally will wait several months before considering a repeat injection of the same type.  If you have any questions, please call [phone] [facility] A knee injection is a procedure to get medicine into your knee joint. Your health care provider puts a needle into the joint and injects medicine with an attached syringe. The injected medicine may relieve the pain, swelling, and stiffness of arthritis. The injected medicine may also help to lubricate and cushion your knee joint. You may need more than one injection. LET [facility] CARE PROVIDER KNOW ABOUT:  Any allergies you have.  All medicines you are taking, including vitamins, herbs, eye drops, creams, and over-the-counter medicines.  Previous problems you or members of your family have had with the use of anesthetics.  Any blood disorders you have.  Previous surgeries you have had.  Any medical conditions you may have. RISKS AND COMPLICATIONS Generally, this is a safe procedure. However, problems may occur, including:  Infection.  Bleeding.  Worsening symptoms.  Damage to the area around your knee.  Allergic reaction to any of the medicines.  Skin reactions from repeated injections. BEFORE  THE PROCEDURE  Ask your health care provider about changing or stopping your regular medicines. This is especially important if you are taking diabetes medicines or blood thinners.  Plan to have someone take you home after the procedure. PROCEDURE  You will sit or lie down in a position for your knee to be treated.  The skin over your kneecap will be cleaned with a  germ-killing solution (antiseptic).  You will be given a medicine that numbs the area (local anesthetic). You may feel some stinging.  After your knee becomes numb, you will have a second injection. This is the medicine. This needle is carefully placed between your kneecap and your knee. The medicine is injected into the joint space.  At the end of the procedure, the needle will be removed.  A bandage (dressing) may be placed over the injection site. The procedure may vary among health care providers and hospitals. AFTER THE PROCEDURE  You may have to move your knee through its full range of motion. This helps to get all of the medicine into your joint space.  Your blood pressure, heart rate, breathing rate, and blood oxygen level will be monitored often until the medicines you were given have worn off.  You will be watched to make sure that you do not have a reaction to the injected medicine.   This information is not intended to replace advice given to you by your health care provider. Make sure you discuss any questions you have with your health care provider.   Document Released: 08/14/2006 Document Revised: 06/13/2014 Document Reviewed: 04/02/2014 Elsevier Interactive Patient Education 2016 [place] Facet Blocks Patient Information  Description: The facets are joints in the spine between the vertebrae.  Like any joints in the body, facets can become irritated and painful.  Arthritis can also effect the facets.  By injecting steroids and local anesthetic in and around these joints, we can temporarily block the nerve supply to them.  Steroids act directly on irritated nerves and tissues to reduce selling and inflammation which often leads to decreased pain.  Facet blocks may be done anywhere along the spine from the neck to the low back depending upon the location of your pain.   After numbing the skin with local anesthetic (like Novocaine), a small needle is passed onto the facet  joints under x-ray guidance.  You may experience a sensation of pressure while this is being done.  The entire block usually lasts about 15-25 minutes.   Conditions which may be treated by facet blocks:   Low back/buttock pain  Neck/shoulder pain  Certain types of headaches  Preparation for the injection:   Do not eat any solid food or dairy products within 8 hours of your appointment.  You may drink clear liquid up to 3 hours before appointment.  Clear liquids include water, black coffee, juice or soda.  No milk or cream please.  You may take your regular medication, including pain medications, with a sip of water before your appointment.  Diabetics should hold regular insulin (if taken separately) and take 1/2 normal NPH dose the morning of the procedure.  Carry some sugar containing items with you to your appointment.  A driver must accompany you and be prepared to drive you home after your procedure.  Bring all your current medications with you.  An IV may be inserted and sedation may be given at the discretion of the physician.  A blood pressure cuff, EKG and other monitors will often  be applied during the procedure.  Some patients may need to have extra oxygen administered for a short period.  You will be asked to provide medical information, including your allergies and medications, prior to the procedure.  We must know immediately if you are taking blood thinners (like Coumadin/Warfarin) or if you are allergic to IV iodine contrast (dye).  We must know if you could possible be pregnant.  Possible side-effects:   Bleeding from needle site  Infection (rare, may require surgery)  Nerve injury (rare)  Numbness & tingling (temporary)  Difficulty urinating (rare, temporary)  Spinal headache (a headache worse with upright posture)  Light-headedness (temporary)  Pain at injection site (serveral days)  Decreased blood pressure (rare, temporary)  Weakness in arm/leg  (temporary)  Pressure sensation in back/neck (temporary)   Call if you experience:   Fever/chills associated with headache or increased back/neck pain  Headache worsened by an upright position  New onset, weakness or numbness of an extremity below the injection site  Hives or difficulty breathing (go to the emergency room)  Inflammation or drainage at the injection site(s)  Severe back/neck pain greater than usual  New symptoms which are concerning to you  Please note:  Although the local anesthetic injected can often make your back or neck feel good for several hours after the injection, the pain will likely return. It takes 3-7 days for steroids to work.  You may not notice any pain relief for at least one week.  If effective, we will often do a series of 2-3 injections spaced 3-6 weeks apart to maximally decrease your pain.  After the initial series, you may be a candidate for a more permanent nerve block of the facets.  If you have any questions, please call #[phone]) [facility]  Stop Plaquinil for 11 days prior to procedure Do not eat or drink for 8 hours before procedure Bring someone to drive you home. Take blood pressure medicine with a small sip of water the morning of your procedure

## 2015-11-23 NOTE — Patient Instructions (Addendum)
GENERAL RISKS AND COMPLICATIONS  What are the risk, side effects and possible complications? Generally speaking, most procedures are safe.  However, with any procedure there are risks, side effects, and the possibility of complications.  The risks and complications are dependent upon the sites that are lesioned, or the type of nerve block to be performed.  The closer the procedure is to the spine, the more serious the risks are.  Great care is taken when placing the radio frequency needles, block needles or lesioning probes, but sometimes complications can occur.  Infection: Any time there is an injection through the skin, there is a risk of infection.  This is why sterile conditions are used for these blocks.  There are four possible types of infection.  Localized skin infection.  Central Nervous System Infection-This can be in the form of Meningitis, which can be deadly.  Epidural Infections-This can be in the form of an epidural abscess, which can cause pressure inside of the spine, causing compression of the spinal cord with subsequent paralysis. This would require an emergency surgery to decompress, and there are no guarantees that the patient would recover from the paralysis.  Discitis-This is an infection of the intervertebral discs.  It occurs in about 1% of discography procedures.  It is difficult to treat and it may lead to surgery.        2. Pain: the needles have to go through skin and soft tissues, will cause soreness.       3. Damage to internal structures:  The nerves to be lesioned may be near blood vessels or    other nerves which can be potentially damaged.       4. Bleeding: Bleeding is more common if the patient is taking blood thinners such as  aspirin, Coumadin, Ticiid, Plavix, etc., or if he/she have some genetic predisposition  such as hemophilia. Bleeding into the spinal canal can cause compression of the spinal  cord with subsequent paralysis.  This would require an  emergency surgery to  decompress and there are no guarantees that the patient would recover from the  paralysis.       5. Pneumothorax:  Puncturing of a lung is a possibility, every time a needle is introduced in  the area of the chest or upper back.  Pneumothorax refers to free air around the  collapsed lung(s), inside of the thoracic cavity (chest cavity).  Another two possible  complications related to a similar event would include: Hemothorax and Chylothorax.   These are variations of the Pneumothorax, where instead of air around the collapsed  lung(s), you may have blood or chyle, respectively.       6. Spinal headaches: They may occur with any procedures in the area of the spine.       7. Persistent CSF (Cerebro-Spinal Fluid) leakage: This is a rare problem, but may occur  with prolonged intrathecal or epidural catheters either due to the formation of a fistulous  track or a dural tear.       8. Nerve damage: By working so close to the spinal cord, there is always a possibility of  nerve damage, which could be as serious as a permanent spinal cord injury with  paralysis.       9. Death:  Although rare, severe deadly allergic reactions known as "Anaphylactic  reaction" can occur to any of the medications used.      10. Worsening of the symptoms:  We can always make thing worse.  What are the chances of something like this happening? Chances of any of this occuring are extremely low.  By statistics, you have more of a chance of getting killed in a motor vehicle accident: while driving to the hospital than any of the above occurring .  Nevertheless, you should be aware that they are possibilities.  In general, it is similar to taking a shower.  Everybody knows that you can slip, hit your head and get killed.  Does that mean that you should not shower again?  Nevertheless always keep in mind that statistics do not mean anything if you happen to be on the wrong side of them.  Even if a procedure has a 1  (one) in a 1,000,000 (million) chance of going wrong, it you happen to be that one..Also, keep in mind that by statistics, you have more of a chance of having something go wrong when taking medications.  Who should not have this procedure? If you are on a blood thinning medication (e.g. Coumadin, Plavix, see list of "Blood Thinners"), or if you have an active infection going on, you should not have the procedure.  If you are taking any blood thinners, please inform your physician.  How should I prepare for this procedure?  Do not eat or drink anything at least six hours prior to the procedure.  Bring a driver with you .  It cannot be a taxi.  Come accompanied by an adult that can drive you back, and that is strong enough to help you if your legs get weak or numb from the local anesthetic.  Take all of your medicines the morning of the procedure with just enough water to swallow them.  If you have diabetes, make sure that you are scheduled to have your procedure done first thing in the morning, whenever possible.  If you have diabetes, take only half of your insulin dose and notify our nurse that you have done so as soon as you arrive at the clinic.  If you are diabetic, but only take blood sugar pills (oral hypoglycemic), then do not take them on the morning of your procedure.  You may take them after you have had the procedure.  Do not take aspirin or any aspirin-containing medications, at least eleven (11) days prior to the procedure.  They may prolong bleeding.  Wear loose fitting clothing that may be easy to take off and that you would not mind if it got stained with Betadine or blood.  Do not wear any jewelry or perfume  Remove any nail coloring.  It will interfere with some of our monitoring equipment.  NOTE: Remember that this is not meant to be interpreted as a complete list of all possible complications.  Unforeseen problems may occur.  BLOOD THINNERS The following drugs  contain aspirin or other products, which can cause increased bleeding during surgery and should not be taken for 2 weeks prior to and 1 week after surgery.  If you should need take something for relief of minor pain, you may take acetaminophen which is found in Tylenol,m Datril, Anacin-3 and Panadol. It is not blood thinner. The products listed below are.  Do not take any of the products listed below in addition to any listed on your instruction sheet.  A.P.C or A.P.C with Codeine Codeine Phosphate Capsules #3 Ibuprofen Ridaura  ABC compound Congesprin Imuran rimadil  Advil Cope Indocin Robaxisal  Alka-Seltzer Effervescent Pain Reliever and Antacid Coricidin or Coricidin-D  Indomethacin Rufen    Alka-Seltzer plus Cold Medicine Cosprin Ketoprofen S-A-C Tablets  Anacin Analgesic Tablets or Capsules Coumadin Korlgesic Salflex  Anacin Extra Strength Analgesic tablets or capsules CP-2 Tablets Lanoril Salicylate  Anaprox Cuprimine Capsules Levenox Salocol  Anexsia-D Dalteparin Magan Salsalate  Anodynos Darvon compound Magnesium Salicylate Sine-off  Ansaid Dasin Capsules Magsal Sodium Salicylate  Anturane Depen Capsules Marnal Soma  APF Arthritis pain formula Dewitt's Pills Measurin Stanback  Argesic Dia-Gesic Meclofenamic Sulfinpyrazone  Arthritis Bayer Timed Release Aspirin Diclofenac Meclomen Sulindac  Arthritis pain formula Anacin Dicumarol Medipren Supac  Analgesic (Safety coated) Arthralgen Diffunasal Mefanamic Suprofen  Arthritis Strength Bufferin Dihydrocodeine Mepro Compound Suprol  Arthropan liquid Dopirydamole Methcarbomol with Aspirin Synalgos  ASA tablets/Enseals Disalcid Micrainin Tagament  Ascriptin Doan's Midol Talwin  Ascriptin A/D Dolene Mobidin Tanderil  Ascriptin Extra Strength Dolobid Moblgesic Ticlid  Ascriptin with Codeine Doloprin or Doloprin with Codeine Momentum Tolectin  Asperbuf Duoprin Mono-gesic Trendar  Aspergum Duradyne Motrin or Motrin IB Triminicin  Aspirin  plain, buffered or enteric coated Durasal Myochrisine Trigesic  Aspirin Suppositories Easprin Nalfon Trillsate  Aspirin with Codeine Ecotrin Regular or Extra Strength Naprosyn Uracel  Atromid-S Efficin Naproxen Ursinus  Auranofin Capsules Elmiron Neocylate Vanquish  Axotal Emagrin Norgesic Verin  Azathioprine Empirin or Empirin with Codeine Normiflo Vitamin E  Azolid Emprazil Nuprin Voltaren  Bayer Aspirin plain, buffered or children's or timed BC Tablets or powders Encaprin Orgaran Warfarin Sodium  Buff-a-Comp Enoxaparin Orudis Zorpin  Buff-a-Comp with Codeine Equegesic Os-Cal-Gesic   Buffaprin Excedrin plain, buffered or Extra Strength Oxalid   Bufferin Arthritis Strength Feldene Oxphenbutazone   Bufferin plain or Extra Strength Feldene Capsules Oxycodone with Aspirin   Bufferin with Codeine Fenoprofen Fenoprofen Pabalate or Pabalate-SF   Buffets II Flogesic Panagesic   Buffinol plain or Extra Strength Florinal or Florinal with Codeine Panwarfarin   Buf-Tabs Flurbiprofen Penicillamine   Butalbital Compound Four-way cold tablets Penicillin   Butazolidin Fragmin Pepto-Bismol   Carbenicillin Geminisyn Percodan   Carna Arthritis Reliever Geopen Persantine   Carprofen Gold's salt Persistin   Chloramphenicol Goody's Phenylbutazone   Chloromycetin Haltrain Piroxlcam   Clmetidine heparin Plaquenil   Cllnoril Hyco-pap Ponstel   Clofibrate Hydroxy chloroquine Propoxyphen         Before stopping any of these medications, be sure to consult the physician who ordered them.  Some, such as Coumadin (Warfarin) are ordered to prevent or treat serious conditions such as "deep thrombosis", "pumonary embolisms", and other heart problems.  The amount of time that you may need off of the medication may also vary with the medication and the reason for which you were taking it.  If you are taking any of these medications, please make sure you notify your pain physician before you undergo any  procedures.         Epidural Steroid Injection Patient Information  Description: The epidural space surrounds the nerves as they exit the spinal cord.  In some patients, the nerves can be compressed and inflamed by a bulging disc or a tight spinal canal (spinal stenosis).  By injecting steroids into the epidural space, we can bring irritated nerves into direct contact with a potentially helpful medication.  These steroids act directly on the irritated nerves and can reduce swelling and inflammation which often leads to decreased pain.  Epidural steroids may be injected anywhere along the spine and from the neck to the low back depending upon the location of your pain.   After numbing the skin with local anesthetic (like Novocaine), a small needle is passed   into the epidural space slowly.  You may experience a sensation of pressure while this is being done.  The entire block usually last less than 10 minutes.  Conditions which may be treated by epidural steroids:   Low back and leg pain  Neck and arm pain  Spinal stenosis  Post-laminectomy syndrome  Herpes zoster (shingles) pain  Pain from compression fractures  Preparation for the injection:   Do not eat any solid food or dairy products within 8 hours of your appointment.   You may drink clear liquids up to 3 hours before appointment.  Clear liquids include water, black coffee, juice or soda.  No milk or cream please.  You may take your regular medication, including pain medications, with a sip of water before your appointment  Diabetics should hold regular insulin (if taken separately) and take 1/2 normal NPH dos the morning of the procedure.  Carry some sugar containing items with you to your appointment.  A driver must accompany you and be prepared to drive you home after your procedure.   Bring all your current medications with your.  An IV may be inserted and sedation may be given at the discretion of the physician.     A blood pressure cuff, EKG and other monitors will often be applied during the procedure.  Some patients may need to have extra oxygen administered for a short period.  You will be asked to provide medical information, including your allergies, prior to the procedure.  We must know immediately if you are taking blood thinners (like Coumadin/Warfarin)  Or if you are allergic to IV iodine contrast (dye). We must know if you could possible be pregnant.  Possible side-effects:  Bleeding from needle site  Infection (rare, may require surgery)  Nerve injury (rare)  Numbness & tingling (temporary)  Difficulty urinating (rare, temporary)  Spinal headache ( a headache worse with upright posture)  Light -headedness (temporary)  Pain at injection site (several days)  Decreased blood pressure (temporary)  Weakness in arm/leg (temporary)  Pressure sensation in back/neck (temporary)  Call if you experience:  Fever/chills associated with headache or increased back/neck pain.  Headache worsened by an upright position.  New onset weakness or numbness of an extremity below the injection site  Hives or difficulty breathing (go to the emergency room)  Inflammation or drainage at the infection site  Severe back/neck pain  Any new symptoms which are concerning to you  Please note:  Although the local anesthetic injected can often make your back or neck feel good for several hours after the injection, the pain will likely return.  It takes 3-7 days for steroids to work in the epidural space.  You may not notice any pain relief for at least that one week.  If effective, we will often do a series of three injections spaced 3-6 weeks apart to maximally decrease your pain.  After the initial series, we generally will wait several months before considering a repeat injection of the same type.  If you have any questions, please call [phone] [facility] A knee injection is a procedure to get medicine into your knee joint. Your health care provider puts a needle into the joint and injects medicine with an attached syringe. The injected medicine may relieve the pain, swelling, and stiffness of arthritis. The injected medicine may also help to lubricate and cushion your knee joint. You may need more than one injection. [place]  PROVIDER KNOW ABOUT:  Any allergies you have.  All medicines you are taking, including vitamins, herbs, eye drops, creams, and over-the-counter medicines.  Previous problems you or members of your family have had with the use of anesthetics.  Any blood disorders you have.  Previous surgeries you have had.  Any medical conditions you may have. RISKS AND COMPLICATIONS Generally, this is a safe procedure. However, problems may occur, including:  Infection.  Bleeding.  Worsening symptoms.  Damage to the area around your knee.  Allergic reaction to any of the medicines.  Skin reactions from repeated injections. BEFORE THE PROCEDURE  Ask your health care provider about changing or stopping your regular medicines. This is especially important if you are taking diabetes medicines or blood thinners.  Plan to have someone take you home after the procedure. PROCEDURE  You will sit or lie down in a position for your knee to be treated.  The skin over your kneecap will be cleaned with a germ-killing solution (antiseptic).  You will be given a medicine that numbs the area (local anesthetic). You may feel some stinging.  After your knee becomes numb, you will have a second injection. This is the medicine. This needle is carefully placed between your kneecap and your knee. The medicine is injected into the joint space.  At the end of the procedure, the needle will be removed.  A bandage (dressing) may be placed over the injection site. The procedure may vary among health care  providers and hospitals. AFTER THE PROCEDURE  You may have to move your knee through its full range of motion. This helps to get all of the medicine into your joint space.  Your blood pressure, heart rate, breathing rate, and blood oxygen level will be monitored often until the medicines you were given have worn off.  You will be watched to make sure that you do not have a reaction to the injected medicine.   This information is not intended to replace advice given to you by your health care provider. Make sure you discuss any questions you have with your health care provider.   Document Released: 08/14/2006 Document Revised: 06/13/2014 Document Reviewed: 04/02/2014 Elsevier Interactive Patient Education 2016 [place] Facet Blocks Patient Information  Description: The facets are joints in the spine between the vertebrae.  Like any joints in the body, facets can become irritated and painful.  Arthritis can also effect the facets.  By injecting steroids and local anesthetic in and around these joints, we can temporarily block the nerve supply to them.  Steroids act directly on irritated nerves and tissues to reduce selling and inflammation which often leads to decreased pain.  Facet blocks may be done anywhere along the spine from the neck to the low back depending upon the location of your pain.   After numbing the skin with local anesthetic (like Novocaine), a small needle is passed onto the facet joints under x-ray guidance.  You may experience a sensation of pressure while this is being done.  The entire block usually lasts about 15-25 minutes.   Conditions which may be treated by facet blocks:   Low back/buttock pain  Neck/shoulder pain  Certain types of headaches  Preparation for the injection:   Do not eat any solid food or dairy products within 8 hours of your appointment.  You may drink clear liquid up to 3 hours before appointment.  Clear liquids include water, black  coffee, juice or soda.  No milk or cream  please.  You may take your regular medication, including pain medications, with a sip of water before your appointment.  Diabetics should hold regular insulin (if taken separately) and take 1/2 normal NPH dose the morning of the procedure.  Carry some sugar containing items with you to your appointment.  A driver must accompany you and be prepared to drive you home after your procedure.  Bring all your current medications with you.  An IV may be inserted and sedation may be given at the discretion of the physician.  A blood pressure cuff, EKG and other monitors will often be applied during the procedure.  Some patients may need to have extra oxygen administered for a short period.  You will be asked to provide medical information, including your allergies and medications, prior to the procedure.  We must know immediately if you are taking blood thinners (like Coumadin/Warfarin) or if you are allergic to IV iodine contrast (dye).  We must know if you could possible be pregnant.  Possible side-effects:   Bleeding from needle site  Infection (rare, may require surgery)  Nerve injury (rare)  Numbness & tingling (temporary)  Difficulty urinating (rare, temporary)  Spinal headache (a headache worse with upright posture)  Light-headedness (temporary)  Pain at injection site (serveral days)  Decreased blood pressure (rare, temporary)  Weakness in arm/leg (temporary)  Pressure sensation in back/neck (temporary)   Call if you experience:   Fever/chills associated with headache or increased back/neck pain  Headache worsened by an upright position  New onset, weakness or numbness of an extremity below the injection site  Hives or difficulty breathing (go to the emergency room)  Inflammation or drainage at the injection site(s)  Severe back/neck pain greater than usual  New symptoms which are concerning to you  Please  note:  Although the local anesthetic injected can often make your back or neck feel good for several hours after the injection, the pain will likely return. It takes 3-7 days for steroids to work.  You may not notice any pain relief for at least one week.  If effective, we will often do a series of 2-3 injections spaced 3-6 weeks apart to maximally decrease your pain.  After the initial series, you may be a candidate for a more permanent nerve block of the facets.  If you have any questions, please call #[phone]) [facility]  Stop Plaquinil for 11 days prior to procedure Do not eat or drink for 8 hours before procedure Bring someone to drive you home. Take blood pressure medicine with a small sip of water the morning of your procedure

## 2015-12-10 ENCOUNTER — Encounter: Payer: Self-pay | Admitting: Pain Medicine

## 2015-12-10 ENCOUNTER — Ambulatory Visit: Payer: Medicare Other | Attending: Pain Medicine | Admitting: Pain Medicine

## 2015-12-10 VITALS — BP 166/98 | HR 82 | Temp 98.6°F | Resp 16 | Ht 62.0 in | Wt 180.0 lb

## 2015-12-10 DIAGNOSIS — M797 Fibromyalgia: Secondary | ICD-10-CM | POA: Diagnosis not present

## 2015-12-10 DIAGNOSIS — M5412 Radiculopathy, cervical region: Secondary | ICD-10-CM

## 2015-12-10 DIAGNOSIS — D3500 Benign neoplasm of unspecified adrenal gland: Secondary | ICD-10-CM | POA: Diagnosis not present

## 2015-12-10 DIAGNOSIS — M4726 Other spondylosis with radiculopathy, lumbar region: Secondary | ICD-10-CM | POA: Diagnosis not present

## 2015-12-10 DIAGNOSIS — M25512 Pain in left shoulder: Secondary | ICD-10-CM | POA: Insufficient documentation

## 2015-12-10 DIAGNOSIS — M7062 Trochanteric bursitis, left hip: Secondary | ICD-10-CM | POA: Diagnosis not present

## 2015-12-10 DIAGNOSIS — F329 Major depressive disorder, single episode, unspecified: Secondary | ICD-10-CM | POA: Insufficient documentation

## 2015-12-10 DIAGNOSIS — N644 Mastodynia: Secondary | ICD-10-CM | POA: Insufficient documentation

## 2015-12-10 DIAGNOSIS — G8929 Other chronic pain: Secondary | ICD-10-CM | POA: Diagnosis not present

## 2015-12-10 DIAGNOSIS — M25562 Pain in left knee: Secondary | ICD-10-CM | POA: Insufficient documentation

## 2015-12-10 DIAGNOSIS — M47812 Spondylosis without myelopathy or radiculopathy, cervical region: Secondary | ICD-10-CM

## 2015-12-10 DIAGNOSIS — M542 Cervicalgia: Secondary | ICD-10-CM | POA: Diagnosis present

## 2015-12-10 DIAGNOSIS — M79641 Pain in right hand: Secondary | ICD-10-CM | POA: Diagnosis not present

## 2015-12-10 DIAGNOSIS — Z6832 Body mass index (BMI) 32.0-32.9, adult: Secondary | ICD-10-CM | POA: Diagnosis not present

## 2015-12-10 DIAGNOSIS — Z79891 Long term (current) use of opiate analgesic: Secondary | ICD-10-CM | POA: Insufficient documentation

## 2015-12-10 DIAGNOSIS — D61818 Other pancytopenia: Secondary | ICD-10-CM | POA: Insufficient documentation

## 2015-12-10 DIAGNOSIS — R531 Weakness: Secondary | ICD-10-CM | POA: Diagnosis not present

## 2015-12-10 DIAGNOSIS — R29898 Other symptoms and signs involving the musculoskeletal system: Secondary | ICD-10-CM | POA: Diagnosis not present

## 2015-12-10 DIAGNOSIS — H0520 Unspecified exophthalmos: Secondary | ICD-10-CM | POA: Insufficient documentation

## 2015-12-10 DIAGNOSIS — M25511 Pain in right shoulder: Secondary | ICD-10-CM | POA: Diagnosis not present

## 2015-12-10 DIAGNOSIS — R296 Repeated falls: Secondary | ICD-10-CM | POA: Diagnosis not present

## 2015-12-10 DIAGNOSIS — M25561 Pain in right knee: Secondary | ICD-10-CM | POA: Insufficient documentation

## 2015-12-10 DIAGNOSIS — M329 Systemic lupus erythematosus, unspecified: Secondary | ICD-10-CM | POA: Diagnosis not present

## 2015-12-10 DIAGNOSIS — M5127 Other intervertebral disc displacement, lumbosacral region: Secondary | ICD-10-CM | POA: Diagnosis not present

## 2015-12-10 DIAGNOSIS — I10 Essential (primary) hypertension: Secondary | ICD-10-CM | POA: Insufficient documentation

## 2015-12-10 DIAGNOSIS — R202 Paresthesia of skin: Secondary | ICD-10-CM | POA: Insufficient documentation

## 2015-12-10 DIAGNOSIS — M5021 Other cervical disc displacement,  high cervical region: Secondary | ICD-10-CM | POA: Insufficient documentation

## 2015-12-10 DIAGNOSIS — R5382 Chronic fatigue, unspecified: Secondary | ICD-10-CM | POA: Diagnosis not present

## 2015-12-10 MED ORDER — MIDAZOLAM HCL 5 MG/5ML IJ SOLN
1.0000 mg | INTRAMUSCULAR | Status: DC | PRN
  Filled 2015-12-10: qty 5

## 2015-12-10 MED ORDER — LACTATED RINGERS IV SOLN: 1000.0000 mL | Freq: Once | INTRAVENOUS | Status: DC

## 2015-12-10 MED ORDER — SODIUM CHLORIDE 0.9% FLUSH: 2.0000 mL | Freq: Once | INTRAVENOUS | Status: DC

## 2015-12-10 MED ORDER — IOPAMIDOL (ISOVUE-M 200) INJECTION 41%
10.0000 mL | Freq: Once | INTRAMUSCULAR | Status: AC
  Administered 2015-12-10: 10 mL via EPIDURAL
  Filled 2015-12-10: qty 10

## 2015-12-10 MED ORDER — ROPIVACAINE HCL 2 MG/ML IJ SOLN
2.0000 mL | Freq: Once | INTRAMUSCULAR | Status: DC
  Filled 2015-12-10: qty 10

## 2015-12-10 MED ORDER — SODIUM CHLORIDE 0.9 % IJ SOLN
INTRAMUSCULAR | Status: AC
  Filled 2015-12-10: qty 10

## 2015-12-10 MED ORDER — DEXAMETHASONE SODIUM PHOSPHATE 10 MG/ML IJ SOLN
10.0000 mg | Freq: Once | INTRAMUSCULAR | Status: DC
  Filled 2015-12-10: qty 1

## 2015-12-10 MED ORDER — LIDOCAINE HCL (PF) 1 % IJ SOLN
10.0000 mL | Freq: Once | INTRAMUSCULAR | Status: DC
  Filled 2015-12-10: qty 10

## 2015-12-10 MED ORDER — FENTANYL CITRATE (PF) 100 MCG/2ML IJ SOLN
25.0000 ug | INTRAMUSCULAR | Status: DC | PRN
  Filled 2015-12-10: qty 2

## 2015-12-10 NOTE — Progress Notes (Signed)
Patient's Name: Ana [name]  Patient type: Established  MRN: [id number]  Service setting: Ambulatory outpatient  DOB: Jul 06, 1965  Location: [facility]  DOS: 12/10/2015  Primary Care Physician: [facility]  Note by: Beatriz Chancellor A. Dossie Arbour, M.D, DABA, DABAPM, DABPM, DABIPP, [place]  Referring Physician: Milinda Pointer, MD  Specialty: Board-Certified Interventional Pain Management  Last Visit to Pain Management: 11/23/2015   Primary Reason(s) for Visit: Encounter for prescription drug management (Level of risk: moderate) CC: Neck Pain   HPI  Ana [name] is a 50 y.o. year old, female patient, who returns today as an established patient. She has Left arm weakness; Chronic pain; Long term current use of opiate analgesic; Long term prescription opiate use; Opiate use; Encounter for therapeutic drug level monitoring; Encounter for pain management planning; Chronic shoulder pain (Location of Primary Source of Pain) (Bilateral) (L>R); Chronic Shoulder Radicular Pain; Chronic neck pain (Location of Secondary source of pain) (Bilateral) (R>L); Chronic knee pain (Location of Tertiary source of pain) (Bilateral) (L>R); Chronic hip pain (Bilateral) (L>R); Chronic low back pain (Bilateral) (midline) (L>R); Fibromyalgia; Disturbance of skin sensation; Disorder of hematopoietic structure; CFIDS (chronic fatigue and immune dysfunction syndrome); Clinical depression; Essential (primary) hypertension; Systemic lupus erythematosus ([place]); N&V (nausea and vomiting); Trochanteric bursitis of hip (Left); Neurosis, posttraumatic; Pancytopenia ([place]); Arthralgia of multiple joints; Breast pain; Low serum cobalamin; Cannot sleep; Chronic hand pain (Left) (index finger); Chronic hand pain (Right) (middle finger); Abnormal MRI, cervical spine (08/07/2015); Chronic radicular cervical pain; and Cervical spondylosis on her problem list.. Her primarily concern today is the Neck Pain   Pain  Assessment: Self-Reported Pain Score: 8  Clinically the patient looks like a 3/10 Reported level is inconsistent with clinical obrservations Information on the proper use of the pain score provided to the patient today. Pain Type: Chronic pain Pain Location: Neck Pain Descriptors / Indicators: Sharp, Aching Pain Frequency: Intermittent  The patient comes into the clinics today for pharmacological management of her chronic pain. I last saw this patient on 11/23/2015. The patient  reports that she does not use illicit drugs. Her body mass index is 32.91 kg/(m^2).   Procedure canceled due to severe hypertension: The patient comes in today clinics today for a left-sided cervical epidural steroid injection under fluoroscopic guidance and IV sedation. Unfortunately, her diastolic blood pressure was very high as soon as we get her into the procedure room. At no point did go below 100 and in fact at one point, despite the fact that we had given her 4 mg of Versed and 1 cc of fentanyl, her diastolic blood pressure was 145. Clearly, because the procedure is selective, I chose to cancel it. She went on to the recovery room for observation after the sedation and she was instructed to contact her primary care physician so that they can work on bringing her blood pressure down. According to the patient she had taken her blood pressure medicine in the morning but clearly was not doing anything for her. Even worse, the patient indicated that while her blood pressure was not high she was having a severe headache which is an ominous sign for a possible stroke. Today we have explained to the patient great detail what she needs to do and why we cancel the procedure. At this point, we will not repeat that procedure until we get this under control. I know that pain can influence blood pressure, put in this instance, the patient had received IV sedation with opioids and  benzodiazepines and clearly this should've brought down  that blood pressure. The last time I had a case like this, the patient had elevated levels of VMA (vanillylmandelic acid) in urine, and a scan revealed a pheochromocytoma.   Date of Last Visit: 11/23/15 Service Provided on Last Visit: Evaluation  Controlled Substance Pharmacotherapy Assessment & REMS (Risk Evaluation and Mitigation Strategy)  Analgesic: Hydrocodone/APAP 5/325 one every 6 hours (20 mg/day of hydrocodone) MME/day: 20 mg/day Pill Count: Not here for medication management.The patient should have enough medication to last until 01/22/2016. Pharmacokinetics: Onset of action (Liberation/Absorption): Within expected pharmacological parameters Time to Peak effect (Distribution): Timing and results are as within normal expected parameters Duration of action (Metabolism/Excretion): Within normal limits for medication Pharmacodynamics: Analgesic Effect: More than 50% Activity Facilitation: Medication(s) allow patient to sit, stand, walk, and do the basic ADLs Perceived Effectiveness: Described as relatively effective, allowing for increase in activities of daily living (ADL) Side-effects or Adverse reactions: None reported Monitoring: [place] PMP: Online review of the past 73-month period conducted. Compliant with practice rules and regulations All Historical UDS testing(s):  Lab Results  Component Value Date   MDMA NEGATIVE 03/13/2012   COCAINSCRNUR NEGATIVE 03/13/2012   PCPSCRNUR NEGATIVE 03/13/2012   THCU NEGATIVE 03/13/2012   Initial UDS Testing:  SUMMARY  Date Value Ref Range Status  10/13/2015 FINAL  Final    Comment:    ==================================================================== TOXASSURE COMP DRUG ANALYSIS,UR ==================================================================== Test                             Result       Flag       Units Drug Present and Declared for Prescription Verification   Nortriptyline                  PRESENT      EXPECTED     Nortriptyline may be administered as a prescription drug; it is    also an expected metabolite of amitriptyline.   Acetaminophen                  PRESENT      EXPECTED   Promethazine                   PRESENT      EXPECTED Drug Present not Declared for Prescription Verification   Oxcarbazepine MHD              PRESENT      UNEXPECTED    Oxcarbazepine MHD is the active metabolite of oxcarbazepine and    eslicarbazepine.   Baclofen                       PRESENT      UNEXPECTED   Trazodone                      PRESENT      UNEXPECTED   1,3 chlorophenyl piperazine    PRESENT      UNEXPECTED    1,3-chlorophenyl piperazine is an expected metabolite of    trazodone.   Salicylate                     PRESENT      UNEXPECTED Drug Absent but Declared for Prescription Verification   Hydrocodone                    Not  Detected UNEXPECTED ng/mg creat ==================================================================== Test                      Result    Flag   Units      Ref Range   Creatinine              79               mg/dL      >=20 ==================================================================== Declared Medications:  The flagging and interpretation on this report are based on the  following declared medications.  Unexpected results may arise from  inaccuracies in the declared medications.  **Note: The testing scope of this panel includes these medications:  Hydrocodone (Norco)  Nortriptyline (Pamelor)  Promethazine (Phenergan)  **Note: The testing scope of this panel does not include small to  moderate amounts of these reported medications:  Acetaminophen (Norco)  **Note: The testing scope of this panel does not include following  reported medications:  Hydroxychloroquine (Plaquenil) ==================================================================== For clinical consultation, please call [phone]. ====================================================================     List of UDS test(s) done:  Lab Results  Component Value Date   SUMMARY FINAL 10/13/2015   UDS interpretation: Compliant          Medication Assessment Form: Reviewed. Patient indicates being compliant with therapy Treatment compliance: Compliant Risk Assessment: Aberrant Behavior: None observed today Substance Use Disorder (SUD) Risk Level: Low-to-moderate Risk of opioid abuse or dependence: 0.7-3.0% with doses ? 36 MME/day and 6.1-26% with doses ? 120 MME/day. Opioid Risk Tool (ORT) Score:  3 Low Risk for SUD (Score <3) Depression Scale Score: PHQ-2: PHQ-2 Total Score: 0 No depression (0) PHQ-9: PHQ-9 Total Score: 0 No depression (0-4)  Pharmacologic Plan: No change in therapy, at this time  Laboratory Chemistry  Inflammation Markers Lab Results  Component Value Date   ESRSEDRATE 27* 10/15/2015   CRP 0.6 10/15/2015    Renal Function Lab Results  Component Value Date   BUN 17 10/15/2015   CREATININE 0.85 10/15/2015   GFRAA >60 10/15/2015   GFRNONAA >60 10/15/2015    Hepatic Function Lab Results  Component Value Date   AST 16 10/15/2015   ALT 15 10/15/2015   ALBUMIN 4.1 10/15/2015    Electrolytes Lab Results  Component Value Date   NA 137 10/15/2015   K 4.1 10/15/2015   CL 104 10/15/2015   CALCIUM 8.9 10/15/2015   MG 1.9 10/15/2015    Pain Modulating Vitamins Lab Results  Component Value Date   25OHVITD1 29* 10/15/2015   25OHVITD2 <1.0 10/15/2015   25OHVITD3 29 10/15/2015   VITAMINB12 246 10/15/2015    Coagulation Parameters Lab Results  Component Value Date   INR 0.9 05/04/2014   LABPROT 12.2 05/04/2014   PLT 173 08/07/2015    Note: Labs Reviewed.  Recent Diagnostic Imaging  Dg Lumbar Spine Complete W/bend  10/15/2015  CLINICAL DATA:  Chronic low back pain, history of multiple falls, bilateral hip pain, history of fibromyalgia. EXAM: LUMBAR SPINE - COMPLETE WITH BENDING VIEWS COMPARISON:  Lumbar spine lateral views dated April 03, 2012  and AP and lateral views dated February 24, 2009 FINDINGS: The twelfth ribs are hypoplastic. The lumbar vertebral bodies are preserved in height. There is mild disc space narrowing at L4-5. There is minimal (approximately 3-4 mm) anterolisthesis of L4 with respect L5. No pars defects are observed. There is no significant facet joint hypertrophy. IMPRESSION: Mild disc space narrowing at L4-5 with grade 1 anterolisthesis of L4 with  respect L5 as the patient moves from the extended and neutral positions to the flexed position. This appears to be on the basis of degenerative disc disease. No pars defect is demonstrated. The lumbar spine is otherwise unremarkable. Electronically Signed   By: David  Martinique M.D.   On: 10/15/2015 11:32   Dg Shoulder Right  10/15/2015  CLINICAL DATA:  Chronic low back, BILATERAL hip, and BILATERAL shoulder pain, history fibromyalgia and multiple falls EXAM: RIGHT SHOULDER - 2+ VIEW COMPARISON:  None FINDINGS: Osseous demineralization. Minimal degenerative changes AC joint. Os acromiale noted. No acute fracture, dislocation or bone destruction. Visualized RIGHT ribs appear intact IMPRESSION: Degenerative changes RIGHT AC joint with note of an os acromiale. No acute abnormalities. Electronically Signed   By: Lavonia Dana M.D.   On: 10/15/2015 11:29   Dg Shoulder Left  10/15/2015  CLINICAL DATA:  Chronic low back, BILATERAL hip, and BILATERAL shoulder pain, history fibromyalgia and multiple falls EXAM: LEFT SHOULDER - 2+ VIEW COMPARISON:  None FINDINGS: Osseous demineralization. AC joint alignment normal. No glenohumeral fracture or dislocation. Visualized LEFT ribs intact. IMPRESSION: No acute abnormalities. Electronically Signed   By: Lavonia Dana M.D.   On: 10/15/2015 11:28   Dg Hip Unilat W Or W/o Pelvis 2-3 Views Left  10/15/2015  CLINICAL DATA:  Chronic low back, BILATERAL hip, and BILATERAL shoulder pain, history fibromyalgia and multiple falls EXAM: DG HIP (WITH OR WITHOUT  PELVIS) 2-3V LEFT COMPARISON:  None FINDINGS: Symmetric hip and SI joints. Osseous mineralization appears low normal. No acute fracture, dislocation, or bone destruction. IMPRESSION: No acute abnormalities. Electronically Signed   By: Lavonia Dana M.D.   On: 10/15/2015 11:30   Dg Hip Unilat W Or W/o Pelvis 2-3 Views Right  10/15/2015  CLINICAL DATA:  Chronic low back, BILATERAL hip, and BILATERAL shoulder pain, history fibromyalgia and multiple falls EXAM: DG HIP (WITH OR WITHOUT PELVIS) 2-3V RIGHT COMPARISON:  None FINDINGS: RIGHT hip and SI joint space preserved. No acute fracture, dislocation or bone destruction. Tiny RIGHT pelvic phlebolith noted. IMPRESSION: Normal exam. Electronically Signed   By: Lavonia Dana M.D.   On: 10/15/2015 11:31   Cervical Imaging: Cervical MR wo contrast:  Results for orders placed during the hospital encounter of 08/06/15  MR Cervical Spine Wo Contrast   Narrative CLINICAL DATA:  One-week history of neck pain and shoulder and left arm pain.  EXAM: MRI CERVICAL SPINE WITHOUT CONTRAST  TECHNIQUE: Multiplanar, multisequence MR imaging of the cervical spine was performed. No intravenous contrast was administered.  COMPARISON:  Cervical spine CT scan 08/03/2015.  FINDINGS: Normal alignment of the cervical vertebral bodies. They demonstrate normal marrow signal. The cervical spinal cord demonstrates normal signal intensity. No cord lesions or syrinx. The facets are normally aligned. Mild multilevel facet disease.  C2-3:  No significant findings.  C3-4:  No significant findings.  C3-4: Mild annular bulge with slight flattening of the ventral thecal sac and mild narrowing of the ventral CSF space. No significant foraminal stenosis.  C5-6: Bulging annulus with flattening of the ventral thecal sac and mild narrowing of the ventral CSF space. There is also a shallow central disc protrusion without direct neural compression. No significant foraminal  stenosis.  C6-7: Shallow central disc protrusion with mild impression on the ventral thecal sac and mild narrowing of the ventral CSF space. No foraminal stenosis.  C7-T1: No significant findings.  IMPRESSION: 1. Shallow disc protrusions at C5-6 and C6-7 with mild impression on the thecal sac but no  significant neural compression or foraminal stenosis. 2. Mild annular bulge at C3-4. 3. Normal MR appearance of the cervical spinal cord.   Electronically Signed   By: Marijo Sanes M.D.   On: 08/07/2015 14:10    Cervical CT wo contrast:  Results for orders placed during the hospital encounter of 08/03/15  CT Cervical Spine Wo Contrast   Narrative CLINICAL DATA:  50 year old with left neck and shoulder pain. Headache.  EXAM: CT HEAD WITHOUT CONTRAST  CT CERVICAL SPINE WITHOUT CONTRAST  TECHNIQUE: Multidetector CT imaging of the head and cervical spine was performed following the standard protocol without intravenous contrast. Multiplanar CT image reconstructions of the cervical spine were also generated.  COMPARISON:  CT orbits 01/07/2007  FINDINGS: CT HEAD FINDINGS  No evidence for acute hemorrhage, mass lesion, midline shift, hydrocephalus or large infarct. There is mild exophthalmos bilaterally and this is similar to the previous CT. Visualized paranasal sinuses are clear. No calvarial fracture.  CT CERVICAL SPINE FINDINGS  Lung apices are clear without a pneumothorax. Negative for an acute fracture or dislocation. Small lymph nodes on both sides of the neck. There is no significant soft tissue swelling in the neck. Normal appearance of thyroid tissue. Normal alignment of the cervical spine. No significant disc space narrowing or facet disease. There are prominent periapical lucencies involving the right posterior molars. Evidence for dental caries.  IMPRESSION: No acute intracranial abnormality.  No acute bone abnormality in the cervical spine. No  significant degenerative disease in cervical spine.  Odontogenic disease as described.  Stable mild exophthalmos.   Electronically Signed   By: Markus Daft M.D.   On: 08/03/2015 13:48    Cervical DG complete:  Results for orders placed in visit on 04/03/12  DG Cervical Spine Complete   Narrative * PRIOR REPORT IMPORTED FROM AN EXTERNAL SYSTEM *   PRIOR REPORT IMPORTED FROM THE [facility] WORKFLOW SYSTEM   REASON FOR EXAM:    generailized arthralgias myalgias lupus low back pain  lumbar shld. pain hip pain  COMMENTS:  bilateral joint/ bilateral hip pain   PROCEDURE:     DXR - DXR CERVICAL SPINE COMPLETE  - Apr 03 2012  3:17PM   RESULT:     Technique: A 5 view cervical spine series was obtained.   Findings: There is no evidence of fracture, dislocation nor malalignment.  There is no evidence of prevertebral soft tissue swelling.   IMPRESSION:      No evidence of acute osseous abnormalities.       Shoulder Imaging: Shoulder-L DG:  Results for orders placed during the hospital encounter of 10/15/15  DG Shoulder Left   Narrative CLINICAL DATA:  Chronic low back, BILATERAL hip, and BILATERAL shoulder pain, history fibromyalgia and multiple falls  EXAM: LEFT SHOULDER - 2+ VIEW  COMPARISON:  None  FINDINGS: Osseous demineralization.  AC joint alignment normal.  No glenohumeral fracture or dislocation.  Visualized LEFT ribs intact.  IMPRESSION: No acute abnormalities.   Electronically Signed   By: Lavonia Dana M.D.   On: 10/15/2015 11:28    Lumbosacral Imaging: Lumbar MR wo contrast:  Results for orders placed in visit on 02/28/06  MR L Spine Ltd W/O Cm   Narrative * PRIOR REPORT IMPORTED FROM AN EXTERNAL SYSTEM *   PRIOR REPORT IMPORTED FROM THE [facility] WORKFLOW SYSTEM   REASON FOR EXAM:    low back pain  COMMENTS:   PROCEDURE:     MR  - MR LUMBAR SPINE WO CONTRAST  -  Feb 28 2006  1:38PM   RESULT:     The conus medullaris terminates at an L1-2 level.  The  cauda  equina demonstrate no evidence of clumping nor thickening.   At the L5-S1 disc space level a mild central disc protrusion is  appreciated  without evidence of significant thecal sac stenosis, nerve root  compression  nor compromise.  No further evidence of disc herniations, bulges or  protrusions, thecal sac stenosis, nerve root compression or compromise  appreciated.   IMPRESSION:   1)Mild central disc protrusion at L5-S1 as described above, otherwise no  evidence of thecal sac stenosis, nerve root compression or compromise.   Thank you for the opportunity to contribute to the care of your patient.       Lumbar DG 1V:  Results for orders placed in visit on 07/20/05  DG Lumbar Spine 1 View   Narrative * PRIOR REPORT IMPORTED FROM AN EXTERNAL SYSTEM *   PRIOR REPORT IMPORTED FROM THE [facility] WORKFLOW SYSTEM   REASON FOR EXAM:  Cross-table lateral. Patient in room 14  COMMENTS:   PROCEDURE:     DXR - DXR LUMBAR SPINE ONE VIEW ONLY  - Jul 20 2005  6:42PM   RESULT:     A cross-table lateral view of the lumbar spine shows no  fracture  or abnormal vertebral body subluxation.   IMPRESSION:   No acute changes are identified.   Thank you for this opportunity to contribute to the care of your patient.       Lumbar DG 2-3 views:  Results for orders placed in visit on 02/24/09  DG Lumbar Spine 2-3 Views   Narrative * PRIOR REPORT IMPORTED FROM AN EXTERNAL SYSTEM *   PRIOR REPORT IMPORTED FROM THE [facility] [place] EXAM:    low back pain  COMMENTS:   PROCEDURE:     KDR - KDXR LUMBAR SPINE AP AND LATERAL  - Feb 24 2009  12:03PM   RESULT:     Comparison is made to study of 24 February 2009.   The lumbar vertebral bodies are preserved in height. The intervertebral  disc  space heights are well maintained. The posterior elements are grossly  intact. The observed portions of the sacrum appear normal.   IMPRESSION:      I do not see definite  evidence of acute abnormality of  the  lumbar spine. Followup MRI may be useful if there are radicular symptoms.       Lumbar DG Bending views:  Results for orders placed during the hospital encounter of 10/15/15  DG Lumbar Spine Complete W/Bend   Narrative CLINICAL DATA:  Chronic low back pain, history of multiple falls, bilateral hip pain, history of fibromyalgia.  EXAM: LUMBAR SPINE - COMPLETE WITH BENDING VIEWS  COMPARISON:  Lumbar spine lateral views dated April 03, 2012 and AP and lateral views dated February 24, 2009  FINDINGS: The twelfth ribs are hypoplastic. The lumbar vertebral bodies are preserved in height. There is mild disc space narrowing at L4-5. There is minimal (approximately 3-4 mm) anterolisthesis of L4 with respect L5. No pars defects are observed. There is no significant facet joint hypertrophy.  IMPRESSION: Mild disc space narrowing at L4-5 with grade 1 anterolisthesis of L4 with respect L5 as the patient moves from the extended and neutral positions to the flexed position. This appears to be on the basis of degenerative disc disease. No pars defect is demonstrated. The lumbar spine is  otherwise unremarkable.   Electronically Signed   By: David  Martinique M.D.   On: 10/15/2015 11:32    Sacroiliac Joint Imaging: Sacroiliac Joint DG:  Results for orders placed in visit on 04/03/12  DG Si Joints   Narrative * PRIOR REPORT IMPORTED FROM AN EXTERNAL SYSTEM *   PRIOR REPORT IMPORTED FROM THE [facility] WORKFLOW SYSTEM   REASON FOR EXAM:    generailized arthralgias myalgias lupus low back pain  lumbar shld. pain hip pain  COMMENTS:   PROCEDURE:     DXR - DXR SACROILIAC JOINTS  - Apr 03 2012  3:17PM   RESULT:     There is no evidence of fracture, dislocation, or  malalignment.   IMPRESSION:   1. No evidence of acute abnormalities.  2. If there are persistent complaints of pain or persistent clinical  concern, a repeat evaluation in 7-10 days is  recommended if clinically  warranted.   Thank you for the opportunity to contribute to the care of your patient.       Hip Imaging: Hip-R DG 2-3 views:  Results for orders placed during the hospital encounter of 10/15/15  DG HIP UNILAT W OR W/O PELVIS 2-3 VIEWS RIGHT   Narrative CLINICAL DATA:  Chronic low back, BILATERAL hip, and BILATERAL shoulder pain, history fibromyalgia and multiple falls  EXAM: DG HIP (WITH OR WITHOUT PELVIS) 2-3V RIGHT  COMPARISON:  None  FINDINGS: RIGHT hip and SI joint space preserved.  No acute fracture, dislocation or bone destruction.  Tiny RIGHT pelvic phlebolith noted.  IMPRESSION: Normal exam.   Electronically Signed   By: Lavonia Dana M.D.   On: 10/15/2015 11:31    Hip-L DG 2-3 views:  Results for orders placed during the hospital encounter of 10/15/15  DG HIP UNILAT W OR W/O PELVIS 2-3 VIEWS LEFT   Narrative CLINICAL DATA:  Chronic low back, BILATERAL hip, and BILATERAL shoulder pain, history fibromyalgia and multiple falls  EXAM: DG HIP (WITH OR WITHOUT PELVIS) 2-3V LEFT  COMPARISON:  None  FINDINGS: Symmetric hip and SI joints.  Osseous mineralization appears low normal.  No acute fracture, dislocation, or bone destruction.  IMPRESSION: No acute abnormalities.   Electronically Signed   By: Lavonia Dana M.D.   On: 10/15/2015 11:30    Note: Imaging reviewed.  Meds  The patient has a current medication list which includes the following prescription(s): hydrochlorothiazide, hydrocodone-acetaminophen, hydrocodone-acetaminophen, hydroxychloroquine, nortriptyline, and promethazine, and the following Facility-Administered Medications: dexamethasone, fentanyl, lactated ringers, lidocaine (pf), midazolam, ropivacaine (pf) 2 mg/ml (0.2%), sodium chloride, and sodium chloride flush.  Current Outpatient Prescriptions on File Prior to Visit  Medication Sig  . hydrochlorothiazide (HYDRODIURIL) 25 MG tablet Take 25 mg by mouth  daily.  Marland Kitchen HYDROcodone-acetaminophen (NORCO/VICODIN) 5-325 MG tablet Take 1 tablet by mouth every 6 (six) hours as needed for moderate pain.  Marland Kitchen HYDROcodone-acetaminophen (NORCO/VICODIN) 5-325 MG tablet Take 1 tablet by mouth every 6 (six) hours as needed for severe pain.  . hydroxychloroquine (PLAQUENIL) 200 MG tablet Take by mouth 2 (two) times daily.  . nortriptyline (PAMELOR) 50 MG capsule Take 50 mg by mouth at bedtime.  . promethazine (PHENERGAN) 50 MG tablet Take 0.5 tablets (25 mg total) by mouth every 6 (six) hours as needed for nausea or vomiting.   No current facility-administered medications on file prior to visit.    ROS  Constitutional: Denies any fever or chills Gastrointestinal: No reported hemesis, hematochezia, vomiting, or acute GI distress Musculoskeletal: Denies any acute onset joint  swelling, redness, loss of ROM, or weakness Neurological: No reported episodes of acute onset apraxia, aphasia, dysarthria, agnosia, amnesia, paralysis, loss of coordination, or loss of consciousness  Allergies  Ms. Bines is allergic to mirtazapine.  [place]  Medical:  Ms. Fillingham  has a past medical history of Lupus ([place]) (1990); Fibromyalgia; Hypertension; and Lupus ([place]) (08/07/2015). Family: family history includes Diabetes in her mother; Testicular cancer in her father. Surgical:  has past surgical history that includes Cholecystectomy; right ankle surgery; Cesarean section; Abdominal hysterectomy; and Ankle surgery (Right). Tobacco:  reports that she has never smoked. She does not have any smokeless tobacco history on file. Alcohol:  reports that she drinks about 1.2 oz of alcohol per week. Drug:  reports that she does not use illicit drugs.  Constitutional Exam  Vitals: Blood pressure 166/98, pulse 82, temperature 98.6 F (37 C), temperature source Oral, resp. rate 16, height 5\' 2"  (1.575 m), weight 180 lb (81.647 kg), SpO2 100 %. General appearance: Well nourished, well developed, and  well hydrated. In no acute distress Calculated BMI/Body habitus: Body mass index is 32.91 kg/(m^2). (30-34.9 kg/m2) Obese (Class I) - 68% higher incidence of chronic pain Psych/Mental status: Alert and oriented x 3 (person, place, & time) Eyes: PERLA Respiratory: No evidence of acute respiratory distress  Cervical Spine Exam  Inspection: No masses, redness, or swelling Alignment: Symmetrical ROM: Functional: ROM is within functional limits [facility]) Stability: No instability detected Muscle strength & Tone: Functionally intact Sensory: Unimpaired Palpation: No complaints of tenderness  Upper Extremity (UE) Exam    Side: Right upper extremity  Side: Left upper extremity  Inspection: No masses, redness, swelling, or asymmetry  Inspection: No masses, redness, swelling, or asymmetry  ROM:  ROM:  Functional: ROM is within functional limits [facility])        Functional: ROM is within functional limits [facility])        Muscle strength & Tone: Functionally intact  Muscle strength & Tone: Functionally intact  Sensory: Unimpaired  Sensory: Unimpaired  Palpation: No complaints of tenderness  Palpation: No complaints of tenderness   Thoracic Spine Exam  Inspection: No masses, redness, or swelling Alignment: Symmetrical ROM: Functional: ROM is within functional limits [facility]) Stability: No instability detected Sensory: Unimpaired Muscle strength & Tone: Functionally intact Palpation: No complaints of tenderness  Lumbar Spine Exam  Inspection: No masses, redness, or swelling Alignment: Symmetrical ROM: Functional: ROM is within functional limits [facility]) Stability: No instability detected Muscle strength & Tone: Functionally intact Sensory: Unimpaired Palpation: No complaints of tenderness Provocative Tests: Lumbar Hyperextension and rotation test: deferred       Patrick's Maneuver: deferred              Gait & Posture Assessment  Ambulation: Unassisted Gait: Unaffected Posture: WNL   Lower  Extremity Exam    Side: Right lower extremity  Side: Left lower extremity  Inspection: No masses, redness, swelling, or asymmetry ROM:  Inspection: No masses, redness, swelling, or asymmetry ROM:  Functional: ROM is within functional limits [facility])        Functional: ROM is within functional limits [facility])        Muscle strength & Tone: Functionally intact  Muscle strength & Tone: Functionally intact  Sensory: Unimpaired  Sensory: Unimpaired  Palpation: No complaints of tenderness  Palpation: No complaints of tenderness   Assessment & Plan  Primary Diagnosis & Pertinent Problem List: The primary encounter diagnosis was Chronic radicular cervical pain. Diagnoses of Left arm weakness, Chronic Shoulder Radicular  Pain, and Cervical spondylosis were also pertinent to this visit.  Visit Diagnosis: 1. Chronic radicular cervical pain   2. Left arm weakness   3. Chronic Shoulder Radicular Pain   4. Cervical spondylosis     Problems updated and reviewed during this visit: Problem  Chronic Radicular Cervical Pain  Cervical Spondylosis  Cfids (Chronic Fatigue and Immune Dysfunction Syndrome)  N&v (Nausea and Vomiting)    Problem-specific Plan(s): No problem-specific assessment & plan notes found for this encounter.  No new assessment & plan notes have been filed under this hospital service since the last note was generated. Service: Pain Management   Plan of Care   Problem List Items Addressed This Visit      High   Cervical spondylosis (Chronic)   Relevant Medications   fentaNYL (SUBLIMAZE) injection 25-50 mcg   dexamethasone (DECADRON) injection 10 mg   Chronic radicular cervical pain - Primary (Chronic)   Relevant Medications   fentaNYL (SUBLIMAZE) injection 25-50 mcg   lactated ringers infusion 1,000 mL   midazolam (VERSED) 5 MG/5ML injection 1-2 mg   dexamethasone (DECADRON) injection 10 mg   iopamidol (ISOVUE-M) 41 % intrathecal injection 10 mL (Completed)   lidocaine (PF)  (XYLOCAINE) 1 % injection 10 mL   sodium chloride flush (NS) 0.9 % injection 2 mL   ropivacaine (PF) 2 mg/ml (0.2%) (NAROPIN) epidural 2 mL   Chronic Shoulder Radicular Pain (Chronic)   Relevant Medications   midazolam (VERSED) 5 MG/5ML injection 1-2 mg   Left arm weakness       Pharmacotherapy (Medications Ordered): Meds ordered this encounter  Medications  . fentaNYL (SUBLIMAZE) injection 25-50 mcg    Sig:     Make sure Narcan is available in the pyxis when using this medication. In the event of respiratory depression (RR< 8/min): Titrate NARCAN (naloxone) in increments of 0.1 to 0.2 mg IV at 2-3 minute intervals, until desired degree of reversal.  . lactated ringers infusion 1,000 mL    Sig:   . midazolam (VERSED) 5 MG/5ML injection 1-2 mg    Sig:     Make sure Flumazenil is available in the pyxis when using this medication. If oversedation occurs, administer 0.2 mg IV over 15 sec. If after 45 sec no response, administer 0.2 mg again over 1 min; may repeat at 1 min intervals; not to exceed 4 doses (1 mg)  . dexamethasone (DECADRON) injection 10 mg    Sig:   . iopamidol (ISOVUE-M) 41 % intrathecal injection 10 mL    Sig:   . lidocaine (PF) (XYLOCAINE) 1 % injection 10 mL    Sig:   . sodium chloride flush (NS) 0.9 % injection 2 mL    Sig:   . ropivacaine (PF) 2 mg/ml (0.2%) (NAROPIN) epidural 2 mL    Sig:   . sodium chloride 0.9 % injection    Sig:     Patterson, Delores: cabinet override    Lab-work & Procedure Ordered: No orders of the defined types were placed in this encounter.    Imaging Ordered: INFORMED CONSENT DETAILS: TRANSCRIBE TO CONSENT FORM AND OBTAIN PATIENT SIGNATURE INFORMED CONSENT DETAILS: TRANSCRIBE TO CONSENT FORM AND OBTAIN PATIENT SIGNATURE  Interventional Therapies: Scheduled: Diagnostic right-sided cervical epidural steroid injection under fluoroscopic guidance and IV sedation.    Considering:   Diagnostic right-sided cervical epidural  steroid injection under fluoroscopic guidance and IV sedation.   Diagnostic bilateral cervical facet block under fluoroscopic guidance and IV sedation.   Possible bilateral cervical facet  radiofrequency ablation depending on the results of the diagnostic injection.   Diagnostic lumbar epidural steroid injection under fluoroscopic guidance, with or without sedation.   Diagnostic intra-articular shoulder injections.   Possible intra-articular knee injections.   Diagnostic intra-articular hip injections.   Diagnostic bilateral lumbar facet block under fluoroscopic guidance and IV sedation.  Possible bilateral cervical facet radiofrequency ablation depending on the results of the diagnostic injection.    PRN Procedures:   Diagnostic right-sided cervical epidural steroid injection under fluoroscopic guidance and IV sedation.   Diagnostic bilateral cervical facet block under fluoroscopic guidance and IV sedation.   Diagnostic lumbar epidural steroid injection under fluoroscopic guidance, with or without sedation.   Diagnostic intra-articular shoulder injections.   Possible intra-articular knee injections.   Diagnostic intra-articular hip injections.   Diagnostic bilateral lumbar facet block under fluoroscopic guidance and IV sedation.       Referral(s) or Consult(s): None at this time.  New Prescriptions   No medications on file    Medications administered during this visit: We administered iopamidol.  Requested PM Follow-up: Return for Keep prior appointment.  Future Appointments Date Time Provider [place]  01/11/2016 8:20 AM Milinda Pointer, MD [facility] None    Primary Care Physician: [facility] Location: [facility] Note by: Kathlen Brunswick. Dossie Arbour, M.D, DABA, DABAPM, DABPM, DABIPP, FIPP  Pain Score Disclaimer: We use the NRS-11 scale. This is a self-reported, subjective measurement of pain  severity with only modest accuracy. It is used primarily to identify changes within a particular patient. It must be understood that outpatient pain scales are significantly less accurate that those used for research, where they can be applied under ideal controlled circumstances with minimal exposure to variables. In reality, the score is likely to be a combination of pain intensity and pain affect, where pain affect describes the degree of emotional arousal or changes in action readiness caused by the sensory experience of pain. Factors such as social and work situation, setting, emotional state, anxiety levels, expectation, and prior pain experience may influence pain perception and show large inter-individual differences that may also be affected by time variables.  Patient instructions provided during this appointment: There are no Patient Instructions on file for this visit.

## 2015-12-10 NOTE — Progress Notes (Signed)
Safety precautions to be maintained throughout the outpatient stay will include: orient to surroundings, keep bed in low position, maintain call bell within reach at all times, provide assistance with transfer out of bed and ambulation.  

## 2015-12-10 NOTE — Progress Notes (Signed)
Dr Irven Baltimore in to talked with pt about her options. Patient needs to see her Primary Doctor and get BP under control before doing procedures.

## 2015-12-10 NOTE — Progress Notes (Deleted)
Patient's Name: Ana [name]  Patient type: Established  MRN: [id number]  Service setting: Ambulatory outpatient  DOB: 1965/10/30  Location: [facility] Outpatient Pain Management Facility  DOS: 12/10/2015  Primary Care Physician: [facility]  Note by: Beatriz Chancellor A. Dossie Arbour, M.D, DABA, DABAPM, DABPM, Milagros Evener, FIPP  Referring Physician: Milinda Pointer, MD  Specialty: Board-Certified Interventional Pain Management  Last Visit to Pain Management: 11/23/2015   Primary Reason(s) for Visit: Interventional Pain Management Treatment. CC: Neck Pain  Primary Diagnosis: Chronic radicular cervical pain [M54.12, G89.29]   Procedure:  Anesthesia, Analgesia, Anxiolysis:  Type: Diagnostic, Inter-Laminar, Epidural Steroid Injection Region: Posterior Cervico-thoracic Region Level: C7-T1 Laterality: Left-Sided Paramedial  Indications: 1. Chronic radicular cervical pain   2. Left arm weakness   3. Chronic Shoulder Radicular Pain   4. Cervical spondylosis     Pre-procedure Pain Score: 8/10 Reported level of pain is compatible with clinical observations Post-procedure Pain Score: 8   Type: Moderate (Conscious) Sedation & Local Anesthesia Local Anesthetic: Lidocaine 1% Route: Intravenous (IV) IV Access: Secured Sedation: Meaningful verbal contact was maintained at all times during the procedure  Indication(s): Analgesia & Anxiolysis   Pre-Procedure Assessment:  Ana [name] is a 50 y.o. year old, female patient, seen today for interventional treatment. She has Left arm weakness; Chronic pain; Long term current use of opiate analgesic; Long term prescription opiate use; Opiate use; Encounter for therapeutic drug level monitoring; Encounter for pain management planning; Chronic shoulder pain (Location of Primary Source of Pain) (Bilateral) (L>R); Chronic Shoulder Radicular Pain; Chronic neck pain (Location of Secondary source of pain) (Bilateral) (R>L); Chronic knee pain (Location of Tertiary source of  pain) (Bilateral) (L>R); Chronic hip pain (Bilateral) (L>R); Chronic low back pain (Bilateral) (midline) (L>R); Fibromyalgia; Disturbance of skin sensation; Disorder of hematopoietic structure; CFIDS (chronic fatigue and immune dysfunction syndrome); Clinical depression; Essential (primary) hypertension; Systemic lupus erythematosus ([place]); N&V (nausea and vomiting); Trochanteric bursitis of hip (Left); Neurosis, posttraumatic; Pancytopenia ([place]); Arthralgia of multiple joints; Breast pain; Low serum cobalamin; Cannot sleep; Chronic hand pain (Left) (index finger); Chronic hand pain (Right) (middle finger); Abnormal MRI, cervical spine (08/07/2015); Chronic radicular cervical pain; and Cervical spondylosis on her problem list.. Her primarily concern today is the Neck Pain   Pain Type: Chronic pain Pain Location: Neck Pain Descriptors / Indicators: Sharp, Aching Pain Frequency: Intermittent  Date of Last Visit: 11/23/15 Service Provided on Last Visit: Evaluation  Coagulation Parameters Lab Results  Component Value Date   INR 0.9 05/04/2014   LABPROT 12.2 05/04/2014   PLT 173 08/07/2015    Verification of the correct person, correct site (including marking of site), and correct procedure were performed and confirmed by the patient.  Consent: Secured. Under the influence of no sedatives a written informed consent was obtained, after having provided information on the risks and possible complications. To fulfill our ethical and legal obligations, as recommended by the American Medical Association's Code of Ethics, we have provided information to the patient about our clinical impression; the nature and purpose of the treatment or procedure; the risks, benefits, and possible complications of the intervention; alternatives; the risk(s) and benefit(s) of the alternative treatment(s) or procedure(s); and the risk(s) and benefit(s) of doing nothing. The patient was provided information about the risks  and possible complications associated with the procedure. These include, but are not limited to, failure to achieve desired goals, infection, bleeding, organ or nerve damage, allergic reactions, paralysis, and death. In the case of spinal procedures these may include, but  are not limited to, failure to achieve desired goals, infection, bleeding, organ or nerve damage, allergic reactions, paralysis, and death. In addition, the patient was informed that Medicine is not an exact science; therefore, there is also the possibility of unforeseen risks and possible complications that may result in a catastrophic outcome. The patient indicated having understood very clearly. We have given the patient no guarantees and we have made no promises. Enough time was given to the patient to ask questions, all of which were answered to the patient's satisfaction.  Consent Attestation: I, the ordering provider, attest that I have discussed with the patient the benefits, risks, side-effects, alternatives, likelihood of achieving goals, and potential problems during recovery for the procedure that I have provided informed consent.  Pre-Procedure Preparation: Safety Precautions: Allergies reviewed. Appropriate site, procedure, and patient were confirmed by following the Joint Commission's Universal Protocol (UP.01.01.01), in the form of a "Time Out". The patient was asked to confirm marked site and procedure, before commencing. The patient was asked about blood thinners, or active infections, both of which were denied. Patient was assessed for positional comfort and all pressure points were checked before starting procedure. Allergies: She is allergic to mirtazapine.. Infection Control Precautions: Sterile technique used. Standard Universal Precautions were taken as recommended by the Department of [facility] for Disease Control and Prevention (CDC). Standard pre-surgical skin prep was conducted. Respiratory hygiene  and cough etiquette was practiced. Hand hygiene observed. Safe injection practices and needle disposal techniques followed. SDV (single dose vial) medications used. Medications properly checked for expiration dates and contaminants. Personal protective equipment (PPE) used: Surgical mask. Sterile Radiation-resistant gloves. Monitoring:  As per clinic protocol. Filed Vitals:   12/10/15 1401  BP: 159/98  Pulse: 82  Temp: 98.6 F (37 C)  TempSrc: Oral  Resp: 16  Height: _0  (1.575 m)  Weight: 180 lb (81.647 kg)  SpO2: 100%  Calculated BMI: Body mass index is 32.91 kg/(m^2).  Description of Procedure Process:   Time-out: "Time-out" completed before starting procedure, as per protocol. Position: Prone with head of the table was raised to facilitate breathing. Target Area: For Epidural Steroid injections the target is the interlaminar space, initially targeting the lower border of the superior vertebral body lamina. Approach: Paramedial approach. Area Prepped: Entire PosteriorCervical Region Prepping solution: ChloraPrep (2% chlorhexidine gluconate and 70% isopropyl alcohol) Safety Precautions: Aspiration looking for blood return was conducted prior to all injections. At no point did we inject any substances, as a needle was being advanced. No attempts were made at seeking any paresthesias. Safe injection practices and needle disposal techniques used. Medications properly checked for expiration dates. SDV (single dose vial) medications used.   Description of the Procedure: Protocol guidelines were followed. The procedure needle was introduced through the skin, ipsilateral to the reported pain, and advanced to the target area. Bone was contacted and the needle walked caudad, until the lamina was cleared. The epidural space was identified using "loss-of-resistance technique" with 2-3 ml of PF-NaCl (0.9% NSS), in a 5cc LOR glass syringe. EBL: None Materials & Medications Used:  Needle(s) Used: 20g  - 10cm, Tuohy-style epidural needle Medication(s): see below.  Imaging Guidance:  Type of Imaging Technique: Fluoroscopy Guidance (Spinal) Indication(s): Assistance in needle guidance and placement for procedures requiring needle placement in or near specific anatomical locations not easily accessible without such assistance. Exposure Time: Please see nurses notes. Contrast: Before injecting any contrast, we confirmed that the patient did not have an allergy to iodine, shellfish,  or radiological contrast. Once satisfactory needle placement was completed at the desired level, radiological contrast was injected. Injection was conducted under continuous fluoroscopic guidance. Injection of contrast accomplished without complications. See chart for type and volume of contrast used. Fluoroscopic Guidance: I was personally present in the fluoroscopy suite, where the patient was placed in position for the procedure, over the fluoroscopy-compatible table. Fluoroscopy was manipulated, using "Tunnel Vision Technique", to obtain the best possible view of the target area, on the affected side. Parallax error was corrected before commencing the procedure. A "direction-depth-direction" technique was used to introduce the needle under continuous pulsed fluoroscopic guidance. Once the target was reached, antero-posterior, oblique, and lateral fluoroscopic projection views were taken to confirm needle placement in all planes. Permanently recorded images stored by scanning into EMR. Interpretation: Intraoperative imaging interpretation by performing Physician. Adequate needle placement confirmed in AP & Oblique Views. Appropriate spread of contrast to desired area. No evidence of afferent or efferent intravascular uptake. No intrathecal or subarachnoid spread observed. Permanent images scanned into the patient's record.  Antibiotic Prophylaxis:  Indication(s): No indications identified. Type:  Antibiotics Given (last 72  hours)    None       Post-operative Assessment:  Complications: No immediate post-treatment complications observed. Disposition: The patient was discharged home, once institutional criteria were met. Return to clinic in 2 weeks for follow-up evaluation and interpretation of results. The patient tolerated the entire procedure well. A repeat set of vitals were taken after the procedure and the patient was kept under observation following institutional policy, for this type of procedure. Post-procedural neurological assessment was performed, showing return to baseline, prior to discharge. The patient was provided with post-procedure discharge instructions, including a section on how to identify potential problems. Should any problems arise concerning this procedure, the patient was given instructions to immediately contact us, at any time, without hesitation. In any case, we plan to contact the patient by telephone for a follow-up status report regarding this interventional procedure. Comments:  No additional relevant information.  Medications administered during this visit: Ms. Harries had no medications administered during this visit.  Prescriptions ordered during this visit: Meds ordered this encounter  Medications  . fentaNYL (SUBLIMAZE) injection 25-50 mcg    Sig:     Make sure Narcan is available in the pyxis when using this medication. In the event of respiratory depression (RR< 8/min): Titrate NARCAN (naloxone) in increments of 0.1 to 0.2 mg IV at 2-3 minute intervals, until desired degree of reversal.  . lactated ringers infusion 1,000 mL    Sig:   . midazolam (VERSED) 5 MG/5ML injection 1-2 mg    Sig:     Make sure Flumazenil is available in the pyxis when using this medication. If oversedation occurs, administer 0.2 mg IV over 15 sec. If after 45 sec no response, administer 0.2 mg again over 1 min; may repeat at 1 min intervals; not to exceed 4 doses (1 mg)  . dexamethasone (DECADRON)  injection 10 mg    Sig:   . iopamidol (ISOVUE-M) 41 % intrathecal injection 10 mL    Sig:   . lidocaine (PF) (XYLOCAINE) 1 % injection 10 mL    Sig:   . sodium chloride flush (NS) 0.9 % injection 2 mL    Sig:   . ropivacaine (PF) 2 mg/ml (0.2%) (NAROPIN) epidural 2 mL    Sig:     Requested PM Follow-up: Return for Post-op Eval (2-wk).  Future Appointments Date Time Provider [place]  01/11/2016 8:20 AM Milinda Pointer, MD [facility] None    Primary Care Physician: [facility] Location: [place] [facility] Note by: Kathlen Brunswick. Dossie Arbour, M.D, DABA, DABAPM, DABPM, DABIPP, FIPP  Disclaimer:  Medicine is not an exact science. The only guarantee in medicine is that nothing is guaranteed. It is important to note that the decision to proceed with this intervention was based on the information collected from the patient. The Data and conclusions were drawn from the patient's questionnaire, the interview, and the physical examination. Because the information was provided in large part by the patient, it cannot be guaranteed that it has not been purposely or unconsciously manipulated. Every effort has been made to obtain as much relevant data as possible for this evaluation. It is important to note that the conclusions that lead to this procedure are derived in large part from the available data. Always take into account that the treatment will also be dependent on availability of resources and existing treatment guidelines, considered by other Pain Management Practitioners as being common knowledge and practice, at the time of the intervention. For Medico-Legal purposes, it is also important to point out that variation in procedural techniques and pharmacological choices are the acceptable norm. The indications, contraindications, technique, and results of the above procedure should only be interpreted and judged by a Board-Certified Interventional Pain  Specialist with extensive familiarity and expertise in the same exact procedure and technique. Attempts at providing opinions without similar or greater experience and expertise than that of the treating physician will be considered as inappropriate and unethical, and shall result in a formal complaint to the state medical board and applicable specialty societies.

## 2016-01-11 ENCOUNTER — Encounter: Payer: Self-pay | Admitting: Pain Medicine

## 2016-01-11 ENCOUNTER — Ambulatory Visit: Payer: Medicare Other | Attending: Pain Medicine | Admitting: Pain Medicine

## 2016-01-11 VITALS — BP 143/88 | HR 98 | Temp 98.4°F | Resp 18 | Ht 62.0 in | Wt 180.0 lb

## 2016-01-11 DIAGNOSIS — M329 Systemic lupus erythematosus, unspecified: Secondary | ICD-10-CM | POA: Diagnosis not present

## 2016-01-11 DIAGNOSIS — R112 Nausea with vomiting, unspecified: Secondary | ICD-10-CM | POA: Insufficient documentation

## 2016-01-11 DIAGNOSIS — R531 Weakness: Secondary | ICD-10-CM | POA: Diagnosis not present

## 2016-01-11 DIAGNOSIS — F1190 Opioid use, unspecified, uncomplicated: Secondary | ICD-10-CM | POA: Diagnosis not present

## 2016-01-11 DIAGNOSIS — M797 Fibromyalgia: Secondary | ICD-10-CM | POA: Insufficient documentation

## 2016-01-11 DIAGNOSIS — M25561 Pain in right knee: Secondary | ICD-10-CM | POA: Insufficient documentation

## 2016-01-11 DIAGNOSIS — M542 Cervicalgia: Secondary | ICD-10-CM | POA: Diagnosis present

## 2016-01-11 DIAGNOSIS — M25552 Pain in left hip: Secondary | ICD-10-CM | POA: Diagnosis not present

## 2016-01-11 DIAGNOSIS — F329 Major depressive disorder, single episode, unspecified: Secondary | ICD-10-CM | POA: Insufficient documentation

## 2016-01-11 DIAGNOSIS — M4722 Other spondylosis with radiculopathy, cervical region: Secondary | ICD-10-CM | POA: Insufficient documentation

## 2016-01-11 DIAGNOSIS — N644 Mastodynia: Secondary | ICD-10-CM | POA: Diagnosis not present

## 2016-01-11 DIAGNOSIS — Z7982 Long term (current) use of aspirin: Secondary | ICD-10-CM | POA: Insufficient documentation

## 2016-01-11 DIAGNOSIS — G8929 Other chronic pain: Secondary | ICD-10-CM | POA: Diagnosis not present

## 2016-01-11 DIAGNOSIS — M25512 Pain in left shoulder: Secondary | ICD-10-CM | POA: Insufficient documentation

## 2016-01-11 DIAGNOSIS — M25551 Pain in right hip: Secondary | ICD-10-CM | POA: Insufficient documentation

## 2016-01-11 DIAGNOSIS — Z79891 Long term (current) use of opiate analgesic: Secondary | ICD-10-CM

## 2016-01-11 DIAGNOSIS — R200 Anesthesia of skin: Secondary | ICD-10-CM | POA: Insufficient documentation

## 2016-01-11 DIAGNOSIS — M79642 Pain in left hand: Secondary | ICD-10-CM | POA: Insufficient documentation

## 2016-01-11 DIAGNOSIS — Z5181 Encounter for therapeutic drug level monitoring: Secondary | ICD-10-CM

## 2016-01-11 DIAGNOSIS — M25519 Pain in unspecified shoulder: Secondary | ICD-10-CM | POA: Diagnosis present

## 2016-01-11 DIAGNOSIS — R5382 Chronic fatigue, unspecified: Secondary | ICD-10-CM | POA: Insufficient documentation

## 2016-01-11 DIAGNOSIS — I10 Essential (primary) hypertension: Secondary | ICD-10-CM | POA: Diagnosis not present

## 2016-01-11 DIAGNOSIS — M25562 Pain in left knee: Secondary | ICD-10-CM | POA: Insufficient documentation

## 2016-01-11 DIAGNOSIS — R296 Repeated falls: Secondary | ICD-10-CM | POA: Insufficient documentation

## 2016-01-11 DIAGNOSIS — M25511 Pain in right shoulder: Secondary | ICD-10-CM | POA: Insufficient documentation

## 2016-01-11 DIAGNOSIS — M7062 Trochanteric bursitis, left hip: Secondary | ICD-10-CM | POA: Diagnosis not present

## 2016-01-11 DIAGNOSIS — F4310 Post-traumatic stress disorder, unspecified: Secondary | ICD-10-CM | POA: Diagnosis not present

## 2016-01-11 DIAGNOSIS — D61818 Other pancytopenia: Secondary | ICD-10-CM | POA: Insufficient documentation

## 2016-01-11 DIAGNOSIS — M545 Low back pain: Secondary | ICD-10-CM | POA: Diagnosis not present

## 2016-01-11 MED ORDER — HYDROCODONE-ACETAMINOPHEN 5-325 MG PO TABS: 1.0000 | ORAL_TABLET | Freq: Four times a day (QID) | ORAL | 0 refills | Status: AC | PRN

## 2016-01-11 NOTE — Progress Notes (Signed)
Safety precautions to be maintained throughout the outpatient stay will include: orient to surroundings, keep bed in low position, maintain call bell within reach at all times, provide assistance with transfer out of bed and ambulation.  Bottle labeled Hydrocodone/acetam  5/325 mg # 65/120  Filled 12-23-15

## 2016-01-11 NOTE — Patient Instructions (Addendum)
Epidural Steroid Injection Patient Information  Description: The epidural space surrounds the nerves as they exit the spinal cord.  In some patients, the nerves can be compressed and inflamed by a bulging disc or a tight spinal canal (spinal stenosis).  By injecting steroids into the epidural space, we can bring irritated nerves into direct contact with a potentially helpful medication.  These steroids act directly on the irritated nerves and can reduce swelling and inflammation which often leads to decreased pain.  Epidural steroids may be injected anywhere along the spine and from the neck to the low back depending upon the location of your pain.   After numbing the skin with local anesthetic (like Novocaine), a small needle is passed into the epidural space slowly.  You may experience a sensation of pressure while this is being done.  The entire block usually last less than 10 minutes.  Conditions which may be treated by epidural steroids:   Low back and leg pain  Neck and arm pain  Spinal stenosis  Post-laminectomy syndrome  Herpes zoster (shingles) pain  Pain from compression fractures  Preparation for the injection:  1. Do not eat any solid food or dairy products within 8 hours of your appointment.  2. You may drink clear liquids up to 3 hours before appointment.  Clear liquids include water, black coffee, juice or soda.  No milk or cream please. 3. You may take your regular medication, including pain medications, with a sip of water before your appointment  Diabetics should hold regular insulin (if taken separately) and take 1/2 normal NPH dos the morning of the procedure.  Carry some sugar containing items with you to your appointment. 4. A driver must accompany you and be prepared to drive you home after your procedure.  5. Bring all your current medications with your. 6. An IV may be inserted and sedation may be given at the discretion of the physician.   7. A blood pressure  cuff, EKG and other monitors will often be applied during the procedure.  Some patients may need to have extra oxygen administered for a short period. 8. You will be asked to provide medical information, including your allergies, prior to the procedure.  We must know immediately if you are taking blood thinners (like Coumadin/Warfarin)  Or if you are allergic to IV iodine contrast (dye). We must know if you could possible be pregnant.  Possible side-effects:  Bleeding from needle site  Infection (rare, may require surgery)  Nerve injury (rare)  Numbness & tingling (temporary)  Difficulty urinating (rare, temporary)  Spinal headache ( a headache worse with upright posture)  Light -headedness (temporary)  Pain at injection site (several days)  Decreased blood pressure (temporary)  Weakness in arm/leg (temporary)  Pressure sensation in back/neck (temporary)  Call if you experience:  Fever/chills associated with headache or increased back/neck pain.  Headache worsened by an upright position.  New onset weakness or numbness of an extremity below the injection site  Hives or difficulty breathing (go to the emergency room)  Inflammation or drainage at the infection site  Severe back/neck pain  Any new symptoms which are concerning to you  Please note:  Although the local anesthetic injected can often make your back or neck feel good for several hours after the injection, the pain will likely return.  It takes 3-7 days for steroids to work in the epidural space.  You may not notice any pain relief for at least that one week.    If effective, we will often do a series of three injections spaced 3-6 weeks apart to maximally decrease your pain.  After the initial series, we generally will wait several months before considering a repeat injection of the same type.  If you have any questions, please call [phone] [facility]  A prescription  for Hydrocodone x 2 was given to you today.Epidural Steroid Injection Patient Information  Description: The epidural space surrounds the nerves as they exit the spinal cord.  In some patients, the nerves can be compressed and inflamed by a bulging disc or a tight spinal canal (spinal stenosis).  By injecting steroids into the epidural space, we can bring irritated nerves into direct contact with a potentially helpful medication.  These steroids act directly on the irritated nerves and can reduce swelling and inflammation which often leads to decreased pain.  Epidural steroids may be injected anywhere along the spine and from the neck to the low back depending upon the location of your pain.   After numbing the skin with local anesthetic (like Novocaine), a small needle is passed into the epidural space slowly.  You may experience a sensation of pressure while this is being done.  The entire block usually last less than 10 minutes.  Conditions which may be treated by epidural steroids:   Low back and leg pain  Neck and arm pain  Spinal stenosis  Post-laminectomy syndrome  Herpes zoster (shingles) pain  Pain from compression fractures  Preparation for the injection:  9. Do not eat any solid food or dairy products within 8 hours of your appointment.  10. You may drink clear liquids up to 3 hours before appointment.  Clear liquids include water, black coffee, juice or soda.  No milk or cream please. 11. You may take your regular medication, including pain medications, with a sip of water before your appointment  Diabetics should hold regular insulin (if taken separately) and take 1/2 normal NPH dos the morning of the procedure.  Carry some sugar containing items with you to your appointment. 12. A driver must accompany you and be prepared to drive you home after your procedure.  48. Bring all your current medications with your. 14. An IV may be inserted and sedation may be given at the  discretion of the physician.   15. A blood pressure cuff, EKG and other monitors will often be applied during the procedure.  Some patients may need to have extra oxygen administered for a short period. 40. You will be asked to provide medical information, including your allergies, prior to the procedure.  We must know immediately if you are taking blood thinners (like Coumadin/Warfarin)  Or if you are allergic to IV iodine contrast (dye). We must know if you could possible be pregnant.  Possible side-effects:  Bleeding from needle site  Infection (rare, may require surgery)  Nerve injury (rare)  Numbness & tingling (temporary)  Difficulty urinating (rare, temporary)  Spinal headache ( a headache worse with upright posture)  Light -headedness (temporary)  Pain at injection site (several days)  Decreased blood pressure (temporary)  Weakness in arm/leg (temporary)  Pressure sensation in back/neck (temporary)  Call if you experience:  Fever/chills associated with headache or increased back/neck pain.  Headache worsened by an upright position.  New onset weakness or numbness of an extremity below the injection site  Hives or difficulty breathing (go to the emergency room)  Inflammation or drainage at the infection site  Severe  back/neck pain  Any new symptoms which are concerning to you  Please note:  Although the local anesthetic injected can often make your back or neck feel good for several hours after the injection, the pain will likely return.  It takes 3-7 days for steroids to work in the epidural space.  You may not notice any pain relief for at least that one week.  If effective, we will often do a series of three injections spaced 3-6 weeks apart to maximally decrease your pain.  After the initial series, we generally will wait several months before considering a repeat injection of the same type.  If you have any questions, please call [phone] [facility]

## 2016-01-11 NOTE — Progress Notes (Signed)
Patient's Name: Ana [name]  Patient type: Established  MRN: [id number]  Service setting: Ambulatory outpatient  DOB: 08-29-1965  Location: [facility]  DOS: 01/11/2016  Primary Care Physician: [facility]  Note by: Beatriz Chancellor A. Dossie Arbour, M.D  Referring Physician: Katheren Shams  Specialty: Interventional Pain Management  Last Visit to Pain Management: 12/10/2015   Primary Reason(s) for Visit: Encounter for prescription drug management (Level of risk: moderate) CC: Neck Pain and Shoulder Pain   HPI  Ana [name] is a 50 y.o. year old, female patient, who returns today as an established patient. She has Left arm weakness; Chronic pain; Long term current use of opiate analgesic; Long term prescription opiate use; Opiate use; Encounter for therapeutic drug level monitoring; Encounter for pain management planning; Chronic shoulder pain (Location of Primary Source of Pain) (Bilateral) (L>R); Chronic Shoulder Radicular Pain; Chronic neck pain (Location of Secondary source of pain) (Bilateral) (R>L); Chronic knee pain (Location of Tertiary source of pain) (Bilateral) (L>R); Chronic hip pain (Bilateral) (L>R); Chronic low back pain (Bilateral) (midline) (L>R); Fibromyalgia; Disturbance of skin sensation; Disorder of hematopoietic structure; CFIDS (chronic fatigue and immune dysfunction syndrome); Clinical depression; Essential (primary) hypertension; Systemic lupus erythematosus ([place]); N&V (nausea and vomiting); Trochanteric bursitis of hip (Left); Neurosis, posttraumatic; Pancytopenia ([place]); Arthralgia of multiple joints; Breast pain; Low serum cobalamin; Cannot sleep; Chronic hand pain (Left) (index finger); Chronic hand pain (Right) (middle finger); Abnormal MRI, cervical spine (08/07/2015); Chronic radicular cervical pain; and Cervical spondylosis on her problem list.. Her primarily concern today is the Neck Pain and Shoulder Pain   Pain Assessment: Self-Reported Pain  Score: 4              Reported level is compatible with observation       Pain Type: Chronic pain Pain Location: Neck (shoulder) Pain Orientation: Left, Right Pain Descriptors / Indicators: Numbness, Aching, Sharp, Tightness Pain Frequency: Constant  The patient comes into the clinics today for pharmacological management of her chronic pain. I last saw this patient on 12/10/2015. The patient  reports that she does not use drugs. Her body mass index is 32.92 kg/m. On 12/10/2015 the patient came in for a cervical epidural steroid injection that had to be canceled due to her hypertension. The patient was sent to her primary care physician to manage this and get under control before we can proceed with the block. Today the patient has requested an increase in the pain medicine. Request denied. Reviewing the patient's medications, she indicates taking over-the-counter Tylenol as well as Goody powders. This could explain why her blood pressure has been elevated. She did not keep that appointment that we get her with her primary care physician citing that she was stopped by the police because her tail lights were out. Taking all of this into account, we see a pattern that is emerging with a patient requesting higher pain medication, self-medicating at home, and not keeping diagnostic appointments, this type of pattern is observed with patients that may be drug-seeking. At this point I am not diagnosing this patient with medication misuse or abuse, but I am keeping an eye on her actions.  Date of Last Visit: 12/10/15 Service Provided on Last Visit: Evaluation (was unable to do procedure due to BP)  Controlled Substance Pharmacotherapy Assessment & REMS (Risk Evaluation and Mitigation Strategy)  Analgesic: Hydrocodone/APAP 5/325 one every 6 hours (20 mg/day of hydrocodone) MME/day: 20 mg/day Pill Count: Bottle labeled Hydrocodone/acetam  5/325 mg # 65/120.  Filled 12-23-15 Pharmacokinetics: Onset of action  (Liberation/Absorption): Within expected pharmacological parameters Time to Peak effect (Distribution): Timing and results are as within normal expected parameters Duration of action (Metabolism/Excretion): Within normal limits for medication Pharmacodynamics: Analgesic Effect: More than 50% Activity Facilitation: Medication(s) allow patient to sit, stand, walk, and do the basic ADLs Perceived Effectiveness: Described as relatively effective, allowing for increase in activities of daily living (ADL) Side-effects or Adverse reactions: None reported Monitoring: [place] PMP: Online review of the past 6-month period conducted. Compliant with practice rules and regulations All Historical UDS testing(s):  Lab Results  Component Value Date   MDMA NEGATIVE 03/13/2012   COCAINSCRNUR NEGATIVE 03/13/2012   PCPSCRNUR NEGATIVE 03/13/2012   THCU NEGATIVE 03/13/2012   Initial UDS Testing:  Summary  Date Value Ref Range Status  10/13/2015 FINAL  Final    Comment:    ==================================================================== TOXASSURE COMP DRUG ANALYSIS,UR ==================================================================== Test                             Result       Flag       Units Drug Present and Declared for Prescription Verification   Nortriptyline                  PRESENT      EXPECTED    Nortriptyline may be administered as a prescription drug; it is    also an expected metabolite of amitriptyline.   Acetaminophen                  PRESENT      EXPECTED   Promethazine                   PRESENT      EXPECTED Drug Present not Declared for Prescription Verification   Oxcarbazepine MHD              PRESENT      UNEXPECTED    Oxcarbazepine MHD is the active metabolite of oxcarbazepine and    eslicarbazepine.   Baclofen                       PRESENT      UNEXPECTED   Trazodone                      PRESENT      UNEXPECTED   1,3 chlorophenyl piperazine    PRESENT      UNEXPECTED     1,3-chlorophenyl piperazine is an expected metabolite of    trazodone.   Salicylate                     PRESENT      UNEXPECTED Drug Absent but Declared for Prescription Verification   Hydrocodone                    Not Detected UNEXPECTED ng/mg creat ==================================================================== Test                      Result    Flag   Units      Ref Range   Creatinine              79               mg/dL      >=20 ==================================================================== Declared Medications:  The flagging and interpretation on this  report are based on the  following declared medications.  Unexpected results may arise from  inaccuracies in the declared medications.  **Note: The testing scope of this panel includes these medications:  Hydrocodone (Norco)  Nortriptyline (Pamelor)  Promethazine (Phenergan)  **Note: The testing scope of this panel does not include small to  moderate amounts of these reported medications:  Acetaminophen (Norco)  **Note: The testing scope of this panel does not include following  reported medications:  Hydroxychloroquine (Plaquenil) ==================================================================== For clinical consultation, please call [phone]. ====================================================================    List of UDS test(s) done:  Lab Results  Component Value Date   SUMMARY FINAL 10/13/2015   UDS interpretation: Compliant          Medication Assessment Form: Reviewed. Patient indicates being compliant with therapy Treatment compliance: Compliant Risk Assessment: Aberrant Behavior: claims that "nothing else works", missing appointments for diagnostic testing and extensive time discussing medication  Substance Use Disorder (SUD) Risk Level: Moderate Risk of opioid abuse or dependence: 0.7-3.0% with doses ? 36 MME/day and 6.1-26% with doses ? 120 MME/day. Opioid Risk Tool (ORT) Score: Total  Score: 0 Low Risk for SUD (Score <3) Depression Scale Score: PHQ-2:         PHQ-9:          Pharmacologic Plan: No change in therapy, at this time  Laboratory Chemistry  Inflammation Markers Lab Results  Component Value Date   ESRSEDRATE 27 (H) 10/15/2015   CRP 0.6 10/15/2015    Renal Function Lab Results  Component Value Date   BUN 17 10/15/2015   CREATININE 0.85 10/15/2015   GFRAA >60 10/15/2015   GFRNONAA >60 10/15/2015    Hepatic Function Lab Results  Component Value Date   AST 16 10/15/2015   ALT 15 10/15/2015   ALBUMIN 4.1 10/15/2015    Electrolytes Lab Results  Component Value Date   NA 137 10/15/2015   K 4.1 10/15/2015   CL 104 10/15/2015   CALCIUM 8.9 10/15/2015   MG 1.9 10/15/2015    Pain Modulating Vitamins Lab Results  Component Value Date   25OHVITD1 29 (L) 10/15/2015   25OHVITD2 <1.0 10/15/2015   25OHVITD3 29 10/15/2015   VITAMINB12 246 10/15/2015    Coagulation Parameters Lab Results  Component Value Date   INR 0.9 05/04/2014   LABPROT 12.2 05/04/2014   PLT 173 08/07/2015    Cardiovascular Lab Results  Component Value Date   BNP 18.0 10/09/2014   HGB 12.3 08/07/2015   HCT 36.9 08/07/2015    Note: Lab results reviewed.  Recent Diagnostic Imaging  Dg Lumbar Spine Complete W/bend  Result Date: 10/15/2015 CLINICAL DATA:  Chronic low back pain, history of multiple falls, bilateral hip pain, history of fibromyalgia. EXAM: LUMBAR SPINE - COMPLETE WITH BENDING VIEWS COMPARISON:  Lumbar spine lateral views dated April 03, 2012 and AP and lateral views dated February 24, 2009 FINDINGS: The twelfth ribs are hypoplastic. The lumbar vertebral bodies are preserved in height. There is mild disc space narrowing at L4-5. There is minimal (approximately 3-4 mm) anterolisthesis of L4 with respect L5. No pars defects are observed. There is no significant facet joint hypertrophy. IMPRESSION: Mild disc space narrowing at L4-5 with grade 1  anterolisthesis of L4 with respect L5 as the patient moves from the extended and neutral positions to the flexed position. This appears to be on the basis of degenerative disc disease. No pars defect is demonstrated. The lumbar spine is otherwise unremarkable. Electronically Signed   By: Shanon Brow  [place] M.D.   On: 10/15/2015 11:32   Dg Shoulder Right  Result Date: 10/15/2015 CLINICAL DATA:  Chronic low back, BILATERAL hip, and BILATERAL shoulder pain, history fibromyalgia and multiple falls EXAM: RIGHT SHOULDER - 2+ VIEW COMPARISON:  None FINDINGS: Osseous demineralization. Minimal degenerative changes AC joint. Os acromiale noted. No acute fracture, dislocation or bone destruction. Visualized RIGHT ribs appear intact IMPRESSION: Degenerative changes RIGHT AC joint with note of an os acromiale. No acute abnormalities. Electronically Signed   By: Lavonia Dana M.D.   On: 10/15/2015 11:29   Dg Shoulder Left  Result Date: 10/15/2015 CLINICAL DATA:  Chronic low back, BILATERAL hip, and BILATERAL shoulder pain, history fibromyalgia and multiple falls EXAM: LEFT SHOULDER - 2+ VIEW COMPARISON:  None FINDINGS: Osseous demineralization. AC joint alignment normal. No glenohumeral fracture or dislocation. Visualized LEFT ribs intact. IMPRESSION: No acute abnormalities. Electronically Signed   By: Lavonia Dana M.D.   On: 10/15/2015 11:28   Dg Hip Unilat W Or W/o Pelvis 2-3 Views Left  Result Date: 10/15/2015 CLINICAL DATA:  Chronic low back, BILATERAL hip, and BILATERAL shoulder pain, history fibromyalgia and multiple falls EXAM: DG HIP (WITH OR WITHOUT PELVIS) 2-3V LEFT COMPARISON:  None FINDINGS: Symmetric hip and SI joints. Osseous mineralization appears low normal. No acute fracture, dislocation, or bone destruction. IMPRESSION: No acute abnormalities. Electronically Signed   By: Lavonia Dana M.D.   On: 10/15/2015 11:30   Dg Hip Unilat W Or W/o Pelvis 2-3 Views Right  Result Date: 10/15/2015 CLINICAL DATA:   Chronic low back, BILATERAL hip, and BILATERAL shoulder pain, history fibromyalgia and multiple falls EXAM: DG HIP (WITH OR WITHOUT PELVIS) 2-3V RIGHT COMPARISON:  None FINDINGS: RIGHT hip and SI joint space preserved. No acute fracture, dislocation or bone destruction. Tiny RIGHT pelvic phlebolith noted. IMPRESSION: Normal exam. Electronically Signed   By: Lavonia Dana M.D.   On: 10/15/2015 11:31    Meds  The patient has a current medication list which includes the following prescription(s): acetaminophen, aspirin-caffeine, hydrochlorothiazide, hydroxychloroquine, nortriptyline, hydrocodone-acetaminophen, and hydrocodone-acetaminophen.  Current Outpatient Prescriptions on File Prior to Visit  Medication Sig  . hydrochlorothiazide (HYDRODIURIL) 25 MG tablet Take 25 mg by mouth daily.  . hydroxychloroquine (PLAQUENIL) 200 MG tablet Take by mouth 2 (two) times daily.  . nortriptyline (PAMELOR) 50 MG capsule Take 50 mg by mouth at bedtime.   No current facility-administered medications on file prior to visit.     ROS  Constitutional: Denies any fever or chills Gastrointestinal: No reported hemesis, hematochezia, vomiting, or acute GI distress Musculoskeletal: Denies any acute onset joint swelling, redness, loss of ROM, or weakness Neurological: No reported episodes of acute onset apraxia, aphasia, dysarthria, agnosia, amnesia, paralysis, loss of coordination, or loss of consciousness  Allergies  Ana [name] is allergic to mirtazapine.  [place]  Medical:  Ana [name]  has a past medical history of Fibromyalgia; Hypertension; Lupus ([place]) (1990); and Lupus ([place]) (08/07/2015). Family: family history includes Diabetes in her mother; Testicular cancer in her father. Surgical:  has a past surgical history that includes Cholecystectomy; right ankle surgery; Cesarean section; Abdominal hysterectomy; and Ankle surgery (Right). Tobacco:  reports that she has never smoked. She does not have any smokeless  tobacco history on file. Alcohol:  reports that she drinks about 1.2 oz of alcohol per week . Drug:  reports that she does not use drugs.  Constitutional Exam  Vitals: Blood pressure (!) 143/88, pulse 98, temperature 98.4 F (36.9 C), resp. rate 18, height 5'  2" (1.575 m), weight 180 lb (81.6 kg), SpO2 96 %. General appearance: Well nourished, well developed, and well hydrated. In no acute distress Calculated BMI/Body habitus: Body mass index is 32.92 kg/m. (30-34.9 kg/m2) Obese (Class I) - 68% higher incidence of chronic pain Psych/Mental status: Alert and oriented x 3 (person, place, & time) Eyes: PERLA Respiratory: No evidence of acute respiratory distress  Cervical Spine Exam  Inspection: No masses, redness, or swelling Alignment: Symmetrical Functional ROM: ROM appears unrestricted Stability: No instability detected Muscle strength & Tone: Functionally intact Sensory: Unimpaired Palpation: Non-contributory  Upper Extremity (UE) Exam    Side: Right upper extremity  Side: Left upper extremity  Inspection: No masses, redness, swelling, or asymmetry  Inspection: No masses, redness, swelling, or asymmetry  Functional ROM: ROM appears unrestricted  Functional ROM: ROM appears unrestricted  Muscle strength & Tone: Functionally intact  Muscle strength & Tone: Functionally intact  Sensory: Unimpaired  Sensory: Unimpaired  Palpation: Non-contributory  Palpation: Non-contributory   Thoracic Spine Exam  Inspection: No masses, redness, or swelling Alignment: Symmetrical Functional ROM: ROM appears unrestricted Stability: No instability detected Sensory: Unimpaired Muscle strength & Tone: Functionally intact Palpation: Non-contributory  Lumbar Spine Exam  Inspection: No masses, redness, or swelling Alignment: Symmetrical Functional ROM: ROM appears unrestricted Stability: No instability detected Muscle strength & Tone: Functionally intact Sensory: Unimpaired Palpation:  Non-contributory Provocative Tests: Lumbar Hyperextension and rotation test: evaluation deferred today       Patrick's Maneuver: evaluation deferred today              Gait & Posture Assessment  Ambulation: Unassisted Gait: Relatively normal for age and body habitus Posture: WNL   Lower Extremity Exam    Side: Right lower extremity  Side: Left lower extremity  Inspection: No masses, redness, swelling, or asymmetry  Inspection: No masses, redness, swelling, or asymmetry  Functional ROM: ROM appears unrestricted  Functional ROM: ROM appears unrestricted  Muscle strength & Tone: Functionally intact  Muscle strength & Tone: Functionally intact  Sensory: Unimpaired  Sensory: Unimpaired  Palpation: Non-contributory  Palpation: Non-contributory    Assessment & Plan  Primary Diagnosis & Pertinent Problem List: The primary encounter diagnosis was Chronic pain. Diagnoses of Long term current use of opiate analgesic, Opiate use, Encounter for therapeutic drug level monitoring, Chronic shoulder pain (Location of Primary Source of Pain) (Bilateral) (L>R), Chronic neck pain (Location of Secondary source of pain) (Bilateral) (R>L), and Chronic knee pain (Location of Tertiary source of pain) (Bilateral) (L>R) were also pertinent to this visit.  Visit Diagnosis: 1. Chronic pain   2. Long term current use of opiate analgesic   3. Opiate use   4. Encounter for therapeutic drug level monitoring   5. Chronic shoulder pain (Location of Primary Source of Pain) (Bilateral) (L>R)   6. Chronic neck pain (Location of Secondary source of pain) (Bilateral) (R>L)   7. Chronic knee pain (Location of Tertiary source of pain) (Bilateral) (L>R)     Problems updated and reviewed during this visit: Problem  Clinical Depression   Overview:  Added to problem list as part of QI consistent with current medical treatment     Problem-specific Plan(s): No problem-specific Assessment & Plan notes found for this  encounter.  No new Assessment & Plan notes have been filed under this hospital service since the last note was generated. Service: Pain Management   Plan of Care   Problem List Items Addressed This Visit      High   Chronic knee  pain (Location of Tertiary source of pain) (Bilateral) (L>R) (Chronic)   Relevant Medications   HYDROcodone-acetaminophen (NORCO/VICODIN) 5-325 MG tablet   HYDROcodone-acetaminophen (NORCO/VICODIN) 5-325 MG tablet   Aspirin-Caffeine 845-65 MG PACK   Acetaminophen (PAIN RELIEF EXTRA STRENGTH) 500 MG coapsule   Chronic neck pain (Location of Secondary source of pain) (Bilateral) (R>L) (Chronic)   Relevant Medications   HYDROcodone-acetaminophen (NORCO/VICODIN) 5-325 MG tablet   HYDROcodone-acetaminophen (NORCO/VICODIN) 5-325 MG tablet   Aspirin-Caffeine 845-65 MG PACK   Acetaminophen (PAIN RELIEF EXTRA STRENGTH) 500 MG coapsule   Other Relevant Orders   Cervical Epidural Injection   Chronic pain - Primary (Chronic)   Relevant Medications   HYDROcodone-acetaminophen (NORCO/VICODIN) 5-325 MG tablet   HYDROcodone-acetaminophen (NORCO/VICODIN) 5-325 MG tablet   Aspirin-Caffeine 845-65 MG PACK   Acetaminophen (PAIN RELIEF EXTRA STRENGTH) 500 MG coapsule   Chronic shoulder pain (Location of Primary Source of Pain) (Bilateral) (L>R) (Chronic)   Relevant Orders   Cervical Epidural Injection     Medium   Encounter for therapeutic drug level monitoring   Long term current use of opiate analgesic (Chronic)   Relevant Orders   ToxASSURE Select 13 (MW), Urine   Opiate use (Chronic)    Other Visit Diagnoses   None.      Pharmacotherapy (Medications Ordered): Meds ordered this encounter  Medications  . HYDROcodone-acetaminophen (NORCO/VICODIN) 5-325 MG tablet    Sig: Take 1 tablet by mouth every 6 (six) hours as needed for severe pain.    Dispense:  120 tablet    Refill:  0    Do not place this medication, or any other prescription from our practice, on  "Automatic Refill". Patient may have prescription filled one day early if pharmacy is closed on scheduled refill date. Do not fill until: 01/22/16 To last until: 02/21/16  . HYDROcodone-acetaminophen (NORCO/VICODIN) 5-325 MG tablet    Sig: Take 1 tablet by mouth every 6 (six) hours as needed for severe pain.    Dispense:  120 tablet    Refill:  0    Do not place this medication, or any other prescription from our practice, on "Automatic Refill". Patient may have prescription filled one day early if pharmacy is closed on scheduled refill date. Do not fill until: 02/21/16 To last until: 03/22/16    [facility] & Procedure Ordered: Orders Placed This Encounter  Procedures  . Cervical Epidural Injection  . ToxASSURE Select 13 (MW), Urine    Imaging Ordered: None  Interventional Therapies: Scheduled: Diagnostic right-sided cervical epidural steroid injection under fluoroscopic guidance and IV sedation.    Considering:   Diagnostic right-sided cervical epidural steroid injection under fluoroscopic guidance and IV sedation.   Diagnostic bilateral cervical facet block under fluoroscopic guidance and IV sedation.   Possible bilateral cervical facet radiofrequency ablation depending on the results of the diagnostic injection.   Diagnostic lumbar epidural steroid injection under fluoroscopic guidance, with or without sedation.   Diagnostic intra-articular shoulder injections.   Possible intra-articular knee injections.   Diagnostic intra-articular hip injections.   Diagnostic bilateral lumbar facet block under fluoroscopic guidance and IV sedation.  Possible bilateral cervical facet radiofrequency ablation depending on the results of the diagnostic injection.    PRN Procedures:   Diagnostic right-sided cervical epidural steroid injection under fluoroscopic guidance and IV sedation.   Diagnostic bilateral cervical facet block under fluoroscopic guidance and IV  sedation.   Diagnostic lumbar epidural steroid injection under fluoroscopic guidance, with or without sedation.   Diagnostic intra-articular shoulder injections.  Possible intra-articular knee injections.   Diagnostic intra-articular hip injections.   Diagnostic bilateral lumbar facet block under fluoroscopic guidance and IV sedation.       Referral(s) or Consult(s): None at this time.  New Prescriptions   No medications on file    Medications administered during this visit: Ana [name] had no medications administered during this visit.  Requested PM Follow-up: Return in 8 weeks (on 03/09/2016) for (2-Mo) Med-Mgmt, Schedule Procedure.  Future Appointments Date Time Provider [place]  03/09/2016 8:20 AM Milinda Pointer, MD [facility] None    Primary Care Physician: [facility] Location: [facility] Note by: Kathlen Brunswick. Dossie Arbour, M.D, DABA, DABAPM, DABPM, DABIPP, FIPP  Pain Score Disclaimer: We use the NRS-11 scale. This is a self-reported, subjective measurement of pain severity with only modest accuracy. It is used primarily to identify changes within a particular patient. It must be understood that outpatient pain scales are significantly less accurate that those used for research, where they can be applied under ideal controlled circumstances with minimal exposure to variables. In reality, the score is likely to be a combination of pain intensity and pain affect, where pain affect describes the degree of emotional arousal or changes in action readiness caused by the sensory experience of pain. Factors such as social and work situation, setting, emotional state, anxiety levels, expectation, and prior pain experience may influence pain perception and show large inter-individual differences that may also be affected by time variables.  Patient instructions provided during this appointment: Patient Instructions  Epidural  Steroid Injection Patient Information  Description: The epidural space surrounds the nerves as they exit the spinal cord.  In some patients, the nerves can be compressed and inflamed by a bulging disc or a tight spinal canal (spinal stenosis).  By injecting steroids into the epidural space, we can bring irritated nerves into direct contact with a potentially helpful medication.  These steroids act directly on the irritated nerves and can reduce swelling and inflammation which often leads to decreased pain.  Epidural steroids may be injected anywhere along the spine and from the neck to the low back depending upon the location of your pain.   After numbing the skin with local anesthetic (like Novocaine), a small needle is passed into the epidural space slowly.  You may experience a sensation of pressure while this is being done.  The entire block usually last less than 10 minutes.  Conditions which may be treated by epidural steroids:   Low back and leg pain  Neck and arm pain  Spinal stenosis  Post-laminectomy syndrome  Herpes zoster (shingles) pain  Pain from compression fractures  Preparation for the injection:  1. Do not eat any solid food or dairy products within 8 hours of your appointment.  2. You may drink clear liquids up to 3 hours before appointment.  Clear liquids include water, black coffee, juice or soda.  No milk or cream please. 3. You may take your regular medication, including pain medications, with a sip of water before your appointment  Diabetics should hold regular insulin (if taken separately) and take 1/2 normal NPH dos the morning of the procedure.  Carry some sugar containing items with you to your appointment. 4. A driver must accompany you and be prepared to drive you home after your procedure.  5. Bring all your current medications with your. 6. An IV may be inserted and sedation may be given at the discretion of the physician.  7. A blood pressure cuff, EKG  and other monitors will often be applied during the procedure.  Some patients may need to have extra oxygen administered for a short period. 8. You will be asked to provide medical information, including your allergies, prior to the procedure.  We must know immediately if you are taking blood thinners (like Coumadin/Warfarin)  Or if you are allergic to IV iodine contrast (dye). We must know if you could possible be pregnant.  Possible side-effects:  Bleeding from needle site  Infection (rare, may require surgery)  Nerve injury (rare)  Numbness & tingling (temporary)  Difficulty urinating (rare, temporary)  Spinal headache ( a headache worse with upright posture)  Light -headedness (temporary)  Pain at injection site (several days)  Decreased blood pressure (temporary)  Weakness in arm/leg (temporary)  Pressure sensation in back/neck (temporary)  Call if you experience:  Fever/chills associated with headache or increased back/neck pain.  Headache worsened by an upright position.  New onset weakness or numbness of an extremity below the injection site  Hives or difficulty breathing (go to the emergency room)  Inflammation or drainage at the infection site  Severe back/neck pain  Any new symptoms which are concerning to you  Please note:  Although the local anesthetic injected can often make your back or neck feel good for several hours after the injection, the pain will likely return.  It takes 3-7 days for steroids to work in the epidural space.  You may not notice any pain relief for at least that one week.  If effective, we will often do a series of three injections spaced 3-6 weeks apart to maximally decrease your pain.  After the initial series, we generally will wait several months before considering a repeat injection of the same type.  If you have any questions, please call [phone] [facility]  A prescription for  Hydrocodone x 2 was given to you today.Epidural Steroid Injection Patient Information  Description: The epidural space surrounds the nerves as they exit the spinal cord.  In some patients, the nerves can be compressed and inflamed by a bulging disc or a tight spinal canal (spinal stenosis).  By injecting steroids into the epidural space, we can bring irritated nerves into direct contact with a potentially helpful medication.  These steroids act directly on the irritated nerves and can reduce swelling and inflammation which often leads to decreased pain.  Epidural steroids may be injected anywhere along the spine and from the neck to the low back depending upon the location of your pain.   After numbing the skin with local anesthetic (like Novocaine), a small needle is passed into the epidural space slowly.  You may experience a sensation of pressure while this is being done.  The entire block usually last less than 10 minutes.  Conditions which may be treated by epidural steroids:   Low back and leg pain  Neck and arm pain  Spinal stenosis  Post-laminectomy syndrome  Herpes zoster (shingles) pain  Pain from compression fractures  Preparation for the injection:  9. Do not eat any solid food or dairy products within 8 hours of your appointment.  10. You may drink clear liquids up to 3 hours before appointment.  Clear liquids include water, black coffee, juice or soda.  No milk or cream please. 11. You may take your regular medication, including pain medications, with a sip of water before your appointment  Diabetics should hold regular insulin (if  taken separately) and take 1/2 normal NPH dos the morning of the procedure.  Carry some sugar containing items with you to your appointment. 12. A driver must accompany you and be prepared to drive you home after your procedure.  66. Bring all your current medications with your. 14. An IV may be inserted and sedation may be given at the discretion  of the physician.   15. A blood pressure cuff, EKG and other monitors will often be applied during the procedure.  Some patients may need to have extra oxygen administered for a short period. 6. You will be asked to provide medical information, including your allergies, prior to the procedure.  We must know immediately if you are taking blood thinners (like Coumadin/Warfarin)  Or if you are allergic to IV iodine contrast (dye). We must know if you could possible be pregnant.  Possible side-effects:  Bleeding from needle site  Infection (rare, may require surgery)  Nerve injury (rare)  Numbness & tingling (temporary)  Difficulty urinating (rare, temporary)  Spinal headache ( a headache worse with upright posture)  Light -headedness (temporary)  Pain at injection site (several days)  Decreased blood pressure (temporary)  Weakness in arm/leg (temporary)  Pressure sensation in back/neck (temporary)  Call if you experience:  Fever/chills associated with headache or increased back/neck pain.  Headache worsened by an upright position.  New onset weakness or numbness of an extremity below the injection site  Hives or difficulty breathing (go to the emergency room)  Inflammation or drainage at the infection site  Severe back/neck pain  Any new symptoms which are concerning to you  Please note:  Although the local anesthetic injected can often make your back or neck feel good for several hours after the injection, the pain will likely return.  It takes 3-7 days for steroids to work in the epidural space.  You may not notice any pain relief for at least that one week.  If effective, we will often do a series of three injections spaced 3-6 weeks apart to maximally decrease your pain.  After the initial series, we generally will wait several months before considering a repeat injection of the same type.  If you have any questions, please call [phone] [facility]

## 2016-01-17 LAB — TOXASSURE SELECT 13 (MW), URINE: PDF: 0

## 2016-03-09 ENCOUNTER — Encounter: Payer: Self-pay | Admitting: Pain Medicine

## 2016-03-09 ENCOUNTER — Ambulatory Visit: Payer: Medicare Other | Attending: Pain Medicine | Admitting: Pain Medicine

## 2016-03-09 VITALS — BP 158/95 | HR 81 | Temp 98.1°F | Resp 18 | Ht 62.0 in | Wt 175.0 lb

## 2016-03-09 DIAGNOSIS — G8929 Other chronic pain: Secondary | ICD-10-CM

## 2016-03-09 DIAGNOSIS — I10 Essential (primary) hypertension: Secondary | ICD-10-CM | POA: Diagnosis not present

## 2016-03-09 DIAGNOSIS — M5412 Radiculopathy, cervical region: Secondary | ICD-10-CM

## 2016-03-09 DIAGNOSIS — Z833 Family history of diabetes mellitus: Secondary | ICD-10-CM | POA: Insufficient documentation

## 2016-03-09 DIAGNOSIS — M25511 Pain in right shoulder: Secondary | ICD-10-CM | POA: Diagnosis not present

## 2016-03-09 DIAGNOSIS — M791 Myalgia: Secondary | ICD-10-CM | POA: Insufficient documentation

## 2016-03-09 DIAGNOSIS — M797 Fibromyalgia: Secondary | ICD-10-CM | POA: Diagnosis not present

## 2016-03-09 DIAGNOSIS — M25512 Pain in left shoulder: Secondary | ICD-10-CM

## 2016-03-09 DIAGNOSIS — M542 Cervicalgia: Secondary | ICD-10-CM | POA: Diagnosis not present

## 2016-03-09 DIAGNOSIS — M329 Systemic lupus erythematosus, unspecified: Secondary | ICD-10-CM | POA: Insufficient documentation

## 2016-03-09 DIAGNOSIS — Z79891 Long term (current) use of opiate analgesic: Secondary | ICD-10-CM | POA: Insufficient documentation

## 2016-03-09 DIAGNOSIS — M79645 Pain in left finger(s): Secondary | ICD-10-CM | POA: Diagnosis not present

## 2016-03-09 DIAGNOSIS — M79642 Pain in left hand: Secondary | ICD-10-CM | POA: Diagnosis not present

## 2016-03-09 DIAGNOSIS — Z79899 Other long term (current) drug therapy: Secondary | ICD-10-CM | POA: Insufficient documentation

## 2016-03-09 NOTE — Progress Notes (Signed)
Patient's Name: Ana [name]  MRN: [id number]  Referring Provider: Katheren [name]  DOB: 10/24/1965  PCP: [facility]  DOS: 03/09/2016  Note by: Kathlen Brunswick. Dossie Arbour, MD  Service setting: Ambulatory outpatient  Specialty: Interventional Pain Management  Location: [facility] (AMB) Pain Management Facility    Patient type: Established   Primary Reason(s) for Visit: Encounter for prescription drug management (Level of risk: moderate) CC: Muscle Pain (generalized ) and Joint Pain (generalized )  HPI  Ana [name] is a 50 y.o. year old, female patient, who comes today for an initial evaluation. She has Left arm weakness; Chronic pain; Long term current use of opiate analgesic; Long term prescription opiate use; Opiate use; Encounter for therapeutic drug level monitoring; Encounter for pain management planning; Chronic shoulder pain (Location of Primary Source of Pain) (Bilateral) (L>R); Chronic Shoulder Radicular Pain; Chronic neck pain (Location of Secondary source of pain) (Bilateral) (R>L); Chronic knee pain (Location of Tertiary source of pain) (Bilateral) (L>R); Chronic hip pain (Bilateral) (L>R); Chronic low back pain (Bilateral) (midline) (L>R); Fibromyalgia; Disturbance of skin sensation; Disorder of hematopoietic structure; CFIDS (chronic fatigue and immune dysfunction syndrome) ([place]); Clinical depression; Essential (primary) hypertension; Systemic lupus erythematosus ([place]); N&V (nausea and vomiting); Trochanteric bursitis of hip (Left); Neurosis, posttraumatic; Pancytopenia ([place]); Arthralgia of multiple joints; Breast pain; Low serum cobalamin; Cannot sleep; Chronic hand pain (Left) (index finger); Chronic hand pain (Right) (middle finger); Abnormal MRI, cervical spine (08/07/2015); Chronic radicular cervical pain; Cervical spondylosis; Chronic fatigue, unspecified; Lupus; and Nausea and vomiting in adult on her problem list.. Her primarily concern today is the Muscle Pain (generalized ) and  Joint Pain (generalized )  Pain Assessment: Self-Reported Pain Score: 5  (pain scale sheet provided )/10 Clinically the patient looks like a 2/10 Reported level is inconsistent with clinical observations. Information on the proper use of the pain score provided to the patient today. Pain Type: Chronic pain Pain Location: Generalized (all joints and muscles ) Pain Orientation: Right, Left Pain Descriptors / Indicators: Aching, Sharp Pain Frequency: Constant (sharp pain comes and goes )  The patient comes into the clinics today for pharmacological management of her chronic pain. I last saw this patient on 01/11/2016. The patient  reports that she does not use drugs. Her body mass index is 32.01 kg/m. Patient indicates pain medicine not working. We had her scheduled for a cervical epidural steroid injection but we had to cancel due to severe hypertension. She now states that she does not want any procedures. I have explained to her what I have to offer. She has elected not to proceed with the available treatment plan. Patient given a PRN CESI appointment, in case she changes her mind.  Date of Last Visit: 01/11/16 Service Provided on Last Visit: Med Refill  Controlled Substance Pharmacotherapy Assessment & REMS (Risk Evaluation and Mitigation Strategy)  Analgesic:Hydrocodone/APAP 5/325 one every 6 hours (20 mg/day of hydrocodone) MME/day:20 mg/day Pill Count: Bottle labeled hydrocodone 5/325 #71/120 last filled 02/22/16. Pharmacokinetics: Onset of action (Liberation/Absorption): Within expected pharmacological parameters Time to Peak effect (Distribution): Timing and results are as within normal expected parameters Duration of action (Metabolism/Excretion): Within normal limits for medication Pharmacodynamics: Analgesic Effect: More than 50% Activity Facilitation: Medication(s) allow patient to sit, stand, walk, and do the basic ADLs Perceived Effectiveness: Described as relatively effective,  allowing for increase in activities of daily living (ADL) Side-effects or Adverse reactions: None reported Monitoring: [place] PMP: Online review of the past 72-month period conducted. Compliant with practice rules  and regulations List of all UDS test(s) done:  Lab Results  Component Value Date   TOXASSSELUR FINAL 01/11/2016   SUMMARY FINAL 10/13/2015   Last UDS on record: ToxAssure Select 13  Date Value Ref Range Status  01/11/2016 FINAL  Final    Comment:    ==================================================================== TOXASSURE SELECT 13 (MW) ==================================================================== Test                             Result       Flag       Units Drug Present and Declared for Prescription Verification   Hydrocodone                    422          EXPECTED   ng/mg creat   Hydromorphone                  22           EXPECTED   ng/mg creat   Dihydrocodeine                 22           EXPECTED   ng/mg creat   Norhydrocodone                 299          EXPECTED   ng/mg creat    Sources of hydrocodone include scheduled prescription    medications. Hydromorphone, dihydrocodeine and norhydrocodone are    expected metabolites of hydrocodone. Hydromorphone and    dihydrocodeine are also available as scheduled prescription    medications. ==================================================================== Test                      Result    Flag   Units      Ref Range   Creatinine              246              mg/dL      >=20 ==================================================================== Declared Medications:  The flagging and interpretation on this report are based on the  following declared medications.  Unexpected results may arise from  inaccuracies in the declared medications.  **Note: The testing scope of this panel includes these medications:  Hydrocodone (Norco)  **Note: The testing scope of this panel does not include following  reported  medications:  Acetaminophen  Acetaminophen (Norco)  Aspirin  Caffeine  Hydrochlorothiazide  Hydroxychloroquine (Plaquenil)  Nortriptyline (Pamelor) ==================================================================== For clinical consultation, please call [phone]. ====================================================================    Summary  Date Value Ref Range Status  10/13/2015 FINAL  Final    Comment:    ==================================================================== TOXASSURE COMP DRUG ANALYSIS,UR ==================================================================== Test                             Result       Flag       Units Drug Present and Declared for Prescription Verification   Nortriptyline                  PRESENT      EXPECTED    Nortriptyline may be administered as a prescription drug; it is    also an expected metabolite of amitriptyline.   Acetaminophen  PRESENT      EXPECTED   Promethazine                   PRESENT      EXPECTED Drug Present not Declared for Prescription Verification   Oxcarbazepine MHD              PRESENT      UNEXPECTED    Oxcarbazepine MHD is the active metabolite of oxcarbazepine and    eslicarbazepine.   Baclofen                       PRESENT      UNEXPECTED   Trazodone                      PRESENT      UNEXPECTED   1,3 chlorophenyl piperazine    PRESENT      UNEXPECTED    1,3-chlorophenyl piperazine is an expected metabolite of    trazodone.   Salicylate                     PRESENT      UNEXPECTED Drug Absent but Declared for Prescription Verification   Hydrocodone                    Not Detected UNEXPECTED ng/mg creat ==================================================================== Test                      Result    Flag   Units      Ref Range   Creatinine              79               mg/dL      >=20 ==================================================================== Declared Medications:  The  flagging and interpretation on this report are based on the  following declared medications.  Unexpected results may arise from  inaccuracies in the declared medications.  **Note: The testing scope of this panel includes these medications:  Hydrocodone (Norco)  Nortriptyline (Pamelor)  Promethazine (Phenergan)  **Note: The testing scope of this panel does not include small to  moderate amounts of these reported medications:  Acetaminophen (Norco)  **Note: The testing scope of this panel does not include following  reported medications:  Hydroxychloroquine (Plaquenil) ==================================================================== For clinical consultation, please call [phone]. ====================================================================    UDS interpretation: Compliant          Medication Assessment Form: Reviewed. Patient indicates being compliant with therapy Treatment compliance: Compliant Risk Assessment: Aberrant Behavior: resistance to changing therapy and extensive time discussing medication  Substance Use Disorder (SUD) Risk Level: Moderate-to-high Risk of opioid abuse or dependence: 0.7-3.0% with doses ? 36 MME/day and 6.1-26% with doses ? 120 MME/day. Opioid Risk Tool (ORT) Score:  0   Low Risk for SUD (Score <3) Depression Scale Score: PHQ-2: 0   No depression (0) PHQ-9: 0   No depression (0-4)  Pharmacologic Plan: No change in therapy, at this time  Laboratory Chemistry  Inflammation Markers Lab Results  Component Value Date   ESRSEDRATE 27 (H) 10/15/2015   CRP 0.6 10/15/2015   Renal Function Lab Results  Component Value Date   BUN 17 10/15/2015   CREATININE 0.85 10/15/2015   GFRAA >60 10/15/2015   GFRNONAA >60 10/15/2015   Hepatic Function Lab Results  Component Value Date   AST 16 10/15/2015   ALT 15 10/15/2015  ALBUMIN 4.1 10/15/2015   Electrolytes Lab Results  Component Value Date   NA 137 10/15/2015   K 4.1 10/15/2015    CL 104 10/15/2015   CALCIUM 8.9 10/15/2015   MG 1.9 10/15/2015   Pain Modulating Vitamins Lab Results  Component Value Date   25OHVITD1 29 (L) 10/15/2015   25OHVITD2 <1.0 10/15/2015   25OHVITD3 29 10/15/2015   VITAMINB12 246 10/15/2015   Coagulation Parameters Lab Results  Component Value Date   INR 0.9 05/04/2014   LABPROT 12.2 05/04/2014   PLT 173 08/07/2015   Cardiovascular Lab Results  Component Value Date   BNP 18.0 10/09/2014   HGB 12.3 08/07/2015   HCT 36.9 08/07/2015    Note: Lab results reviewed.  Recent Diagnostic Imaging  Dg Lumbar Spine Complete W/bend  Result Date: 10/15/2015 CLINICAL DATA:  Chronic low back pain, history of multiple falls, bilateral hip pain, history of fibromyalgia. EXAM: LUMBAR SPINE - COMPLETE WITH BENDING VIEWS COMPARISON:  Lumbar spine lateral views dated April 03, 2012 and AP and lateral views dated February 24, 2009 FINDINGS: The twelfth ribs are hypoplastic. The lumbar vertebral bodies are preserved in height. There is mild disc space narrowing at L4-5. There is minimal (approximately 3-4 mm) anterolisthesis of L4 with respect L5. No pars defects are observed. There is no significant facet joint hypertrophy. IMPRESSION: Mild disc space narrowing at L4-5 with grade 1 anterolisthesis of L4 with respect L5 as the patient moves from the extended and neutral positions to the flexed position. This appears to be on the basis of degenerative disc disease. No pars defect is demonstrated. The lumbar spine is otherwise unremarkable. Electronically Signed   By: David  Martinique M.D.   On: 10/15/2015 11:32   Dg Shoulder Right  Result Date: 10/15/2015 CLINICAL DATA:  Chronic low back, BILATERAL hip, and BILATERAL shoulder pain, history fibromyalgia and multiple falls EXAM: RIGHT SHOULDER - 2+ VIEW COMPARISON:  None FINDINGS: Osseous demineralization. Minimal degenerative changes AC joint. Os acromiale noted. No acute fracture, dislocation or bone  destruction. Visualized RIGHT ribs appear intact IMPRESSION: Degenerative changes RIGHT AC joint with note of an os acromiale. No acute abnormalities. Electronically Signed   By: Lavonia Dana M.D.   On: 10/15/2015 11:29   Dg Shoulder Left  Result Date: 10/15/2015 CLINICAL DATA:  Chronic low back, BILATERAL hip, and BILATERAL shoulder pain, history fibromyalgia and multiple falls EXAM: LEFT SHOULDER - 2+ VIEW COMPARISON:  None FINDINGS: Osseous demineralization. AC joint alignment normal. No glenohumeral fracture or dislocation. Visualized LEFT ribs intact. IMPRESSION: No acute abnormalities. Electronically Signed   By: Lavonia Dana M.D.   On: 10/15/2015 11:28   Dg Hip Unilat W Or W/o Pelvis 2-3 Views Left  Result Date: 10/15/2015 CLINICAL DATA:  Chronic low back, BILATERAL hip, and BILATERAL shoulder pain, history fibromyalgia and multiple falls EXAM: DG HIP (WITH OR WITHOUT PELVIS) 2-3V LEFT COMPARISON:  None FINDINGS: Symmetric hip and SI joints. Osseous mineralization appears low normal. No acute fracture, dislocation, or bone destruction. IMPRESSION: No acute abnormalities. Electronically Signed   By: Lavonia Dana M.D.   On: 10/15/2015 11:30   Dg Hip Unilat W Or W/o Pelvis 2-3 Views Right  Result Date: 10/15/2015 CLINICAL DATA:  Chronic low back, BILATERAL hip, and BILATERAL shoulder pain, history fibromyalgia and multiple falls EXAM: DG HIP (WITH OR WITHOUT PELVIS) 2-3V RIGHT COMPARISON:  None FINDINGS: RIGHT hip and SI joint space preserved. No acute fracture, dislocation or bone destruction. Tiny RIGHT pelvic phlebolith noted. IMPRESSION:  Normal exam. Electronically Signed   By: Lavonia Dana M.D.   On: 10/15/2015 11:31   Meds  The patient has a current medication list which includes the following prescription(s): acetaminophen, aspirin-caffeine, fluticasone, hydrochlorothiazide, hydrocodone-acetaminophen, hydrocodone-acetaminophen, hydroxychloroquine, nortriptyline, and trazodone.  Current  Outpatient Prescriptions on File Prior to Visit  Medication Sig  . Acetaminophen (PAIN RELIEF EXTRA STRENGTH) 500 MG coapsule Take 2 capsules by mouth every 6 (six) hours as needed for fever.  . Aspirin-Caffeine 845-65 MG PACK Take 1 packet by mouth. Takes 4-8 per day  . HYDROcodone-acetaminophen (NORCO/VICODIN) 5-325 MG tablet Take 1 tablet by mouth every 6 (six) hours as needed for severe pain.  Marland Kitchen HYDROcodone-acetaminophen (NORCO/VICODIN) 5-325 MG tablet Take 1 tablet by mouth every 6 (six) hours as needed for severe pain.  . hydroxychloroquine (PLAQUENIL) 200 MG tablet Take by mouth 2 (two) times daily.  . nortriptyline (PAMELOR) 50 MG capsule Take 50 mg by mouth at bedtime.   No current facility-administered medications on file prior to visit.    ROS  Constitutional: Denies any fever or chills Gastrointestinal: No reported hemesis, hematochezia, vomiting, or acute GI distress Musculoskeletal: Denies any acute onset joint swelling, redness, loss of ROM, or weakness Neurological: No reported episodes of acute onset apraxia, aphasia, dysarthria, agnosia, amnesia, paralysis, loss of coordination, or loss of consciousness  Allergies  Ms. Septer is allergic to mirtazapine.  [place]  Medical:  Ms. Gambrell  has a past medical history of Fibromyalgia; Hypertension; Lupus (1990); and Lupus (08/07/2015). Family: family history includes Diabetes in her mother; Testicular cancer in her father. Surgical:  has a past surgical history that includes Cholecystectomy; right ankle surgery; Cesarean section; Abdominal hysterectomy; and Ankle surgery (Right). Tobacco:  reports that she has never smoked. She has never used smokeless tobacco. Alcohol:  reports that she drinks about 1.2 oz of alcohol per week . Drug:  reports that she does not use drugs.  Constitutional Exam  General appearance: Well nourished, well developed, and well hydrated. In no acute distress Vitals:   03/09/16 0829  BP: (!) 158/95   Pulse: 81  Resp: 18  Temp: 98.1 F (36.7 C)  SpO2: 100%  Weight: 175 lb (79.4 kg)  Height: 5\' 2"  (1.575 m)  BMI Assessment: Estimated body mass index is 32.01 kg/m as calculated from the following:   Height as of this encounter: 5\' 2"  (1.575 m).   Weight as of this encounter: 175 lb (79.4 kg).   BMI interpretation: (30-34.9 kg/m2) = Obese (Class I): This range is associated with a 68% higher incidence of chronic pain. BMI Readings from Last 4 Encounters:  03/09/16 32.01 kg/m  01/11/16 32.92 kg/m  12/10/15 32.92 kg/m  11/23/15 32.92 kg/m   Wt Readings from Last 4 Encounters:  03/09/16 175 lb (79.4 kg)  01/11/16 180 lb (81.6 kg)  12/10/15 180 lb (81.6 kg)  11/23/15 180 lb (81.6 kg)  Psych/Mental status: Alert and oriented x 3 (person, place, & time) Eyes: PERLA Respiratory: No evidence of acute respiratory distress  Cervical Spine Exam  Inspection: No masses, redness, or swelling Alignment: Symmetrical Functional ROM: Unrestricted ROM Stability: No instability detected Muscle strength & Tone: Functionally intact Sensory: Unimpaired Palpation: Non-contributory  Upper Extremity (UE) Exam    Side: Right upper extremity  Side: Left upper extremity  Inspection: No masses, redness, swelling, or asymmetry  Inspection: No masses, redness, swelling, or asymmetry  Functional ROM: Unrestricted ROM         Functional ROM: Unrestricted ROM  Muscle strength & Tone: Functionally intact  Muscle strength & Tone: Functionally intact  Sensory: Unimpaired  Sensory: Unimpaired  Palpation: Non-contributory  Palpation: Non-contributory   Thoracic Spine Exam  Inspection: No masses, redness, or swelling Alignment: Symmetrical Functional ROM: Unrestricted ROM Stability: No instability detected Sensory: Unimpaired Muscle strength & Tone: Functionally intact Palpation: Non-contributory  Lumbar Spine Exam  Inspection: No masses, redness, or swelling Alignment:  Symmetrical Functional ROM: Unrestricted ROM Stability: No instability detected Muscle strength & Tone: Functionally intact Sensory: Unimpaired Palpation: Non-contributory Provocative Tests: Lumbar Hyperextension and rotation test: evaluation deferred today       Patrick's Maneuver: evaluation deferred today              Gait & Posture Assessment  Ambulation: Unassisted Gait: Relatively normal for age and body habitus Posture: WNL   Lower Extremity Exam    Side: Right lower extremity  Side: Left lower extremity  Inspection: No masses, redness, swelling, or asymmetry  Inspection: No masses, redness, swelling, or asymmetry  Functional ROM: Unrestricted ROM          Functional ROM: Unrestricted ROM          Muscle strength & Tone: Functionally intact  Muscle strength & Tone: Functionally intact  Sensory: Unimpaired  Sensory: Unimpaired  Palpation: Non-contributory  Palpation: Non-contributory   Assessment  Primary Diagnosis & Pertinent Problem List: The primary encounter diagnosis was Chronic neck pain (Location of Secondary source of pain) (Bilateral) (R>L). Diagnoses of Chronic hand pain (Left) (index finger), Chronic radicular cervical pain, and Chronic shoulder pain (Location of Primary Source of Pain) (Bilateral) (L>R) were also pertinent to this visit.  Visit Diagnosis: 1. Chronic neck pain (Location of Secondary source of pain) (Bilateral) (R>L)   2. Chronic hand pain (Left) (index finger)   3. Chronic radicular cervical pain   4. Chronic shoulder pain (Location of Primary Source of Pain) (Bilateral) (L>R)    Plan of Care  Pharmacotherapy (Medications Ordered): No orders of the defined types were placed in this encounter.  New Prescriptions   No medications on file   Medications administered during this visit: Ms. Timm had no medications administered during this visit. Lab-work, Procedure(s), & Referral(s) Ordered: Orders Placed This Encounter  Procedures  .  Cervical Epidural Injection   Imaging & Referral(s) Ordered: None  Interventional Therapies: Scheduled: None at this time.    Considering:  Diagnostic right-sided cervical epidural steroid injection under fluoroscopic guidance and IV sedation.  Diagnostic bilateral cervical facet block under fluoroscopic guidance and IV sedation.  Possible bilateral cervical facet radiofrequency ablation depending on the results of the diagnostic injection.  Diagnostic lumbar epidural steroid injection under fluoroscopic guidance, with or without sedation.  Diagnostic intra-articular shoulder injections.  Possible intra-articular knee injections.  Diagnostic intra-articular hip injections.  Diagnostic bilateral lumbar facet block under fluoroscopic guidance and IV sedation. Possible bilateral cervical facet radiofrequency ablation depending on the results of the diagnostic injection.    PRN Procedures:  Diagnostic right-sided cervical epidural steroid injection under fluoroscopic guidance and IV sedation.  Diagnostic bilateral cervical facet block under fluoroscopic guidance and IV sedation.  Diagnostic lumbar epidural steroid injection under fluoroscopic guidance, with or without sedation.  Diagnostic intra-articular shoulder injections.  Possible intra-articular knee injections.  Diagnostic intra-articular hip injections.  Diagnostic bilateral lumbar facet block under fluoroscopic guidance and IV sedation.   Requested PM Follow-up: Return if symptoms worsen or fail to improve, for No return appointment..  No future appointments. Primary Care Physician: [facility]  Location: [facility] Note by: Stacee Earp A. Dossie Arbour, M.D, DABA, DABAPM, DABPM, DABIPP, FIPP  Pain Score Disclaimer: We use the NRS-11 scale. This is a self-reported, subjective measurement of pain severity with only modest accuracy. It is used primarily to identify changes  within a particular patient. It must be understood that outpatient pain scales are significantly less accurate that those used for research, where they can be applied under ideal controlled circumstances with minimal exposure to variables. In reality, the score is likely to be a combination of pain intensity and pain affect, where pain affect describes the degree of emotional arousal or changes in action readiness caused by the sensory experience of pain. Factors such as social and work situation, setting, emotional state, anxiety levels, expectation, and prior pain experience may influence pain perception and show large inter-individual differences that may also be affected by time variables.  Patient instructions provided during this appointment: There are no Patient Instructions on file for this visit.

## 2016-03-09 NOTE — Progress Notes (Signed)
Bottle labeled hydrocodone 5/325 #71/120 last filled 02/22/16

## 2016-05-20 ENCOUNTER — Emergency Department: Payer: Medicare Other

## 2016-05-20 ENCOUNTER — Emergency Department
Admission: EM | Admit: 2016-05-20 | Discharge: 2016-05-20 | Disposition: A | Discharge status: Home / Self Care | Payer: Medicare Other | Attending: Emergency Medicine | Admitting: Emergency Medicine

## 2016-05-20 ENCOUNTER — Encounter: Payer: Self-pay | Admitting: Emergency Medicine

## 2016-05-20 DIAGNOSIS — N83202 Unspecified ovarian cyst, left side: Secondary | ICD-10-CM | POA: Insufficient documentation

## 2016-05-20 DIAGNOSIS — I10 Essential (primary) hypertension: Secondary | ICD-10-CM | POA: Insufficient documentation

## 2016-05-20 DIAGNOSIS — Z79899 Other long term (current) drug therapy: Secondary | ICD-10-CM | POA: Insufficient documentation

## 2016-05-20 DIAGNOSIS — K529 Noninfective gastroenteritis and colitis, unspecified: Secondary | ICD-10-CM | POA: Insufficient documentation

## 2016-05-20 DIAGNOSIS — R102 Pelvic and perineal pain: Secondary | ICD-10-CM | POA: Diagnosis present

## 2016-05-20 LAB — CBC
HEMATOCRIT: 35.1 % (ref 35.0–47.0)
Hemoglobin: 12.1 g/dL (ref 12.0–16.0)
MCH: 30.9 pg (ref 26.0–34.0)
MCHC: 34.5 g/dL (ref 32.0–36.0)
MCV: 89.5 fL (ref 80.0–100.0)
PLATELETS: 205 10*3/uL (ref 150–440)
RBC: 3.93 MIL/uL (ref 3.80–5.20)
RDW: 13.7 % (ref 11.5–14.5)
WBC: 3.6 10*3/uL (ref 3.6–11.0)

## 2016-05-20 LAB — URINALYSIS, COMPLETE (UACMP) WITH MICROSCOPIC
BACTERIA UA: NONE SEEN
BILIRUBIN URINE: NEGATIVE
GLUCOSE, UA: NEGATIVE mg/dL
HGB URINE DIPSTICK: NEGATIVE
KETONES UR: NEGATIVE mg/dL
LEUKOCYTES UA: NEGATIVE
NITRITE: NEGATIVE
PH: 5 (ref 5.0–8.0)
Protein, ur: NEGATIVE mg/dL
RBC / HPF: NONE SEEN RBC/hpf (ref 0–5)
SPECIFIC GRAVITY, URINE: 1.015 (ref 1.005–1.030)

## 2016-05-20 LAB — COMPREHENSIVE METABOLIC PANEL
ALBUMIN: 3.9 g/dL (ref 3.5–5.0)
ALT: 14 U/L (ref 14–54)
AST: 18 U/L (ref 15–41)
Alkaline Phosphatase: 77 U/L (ref 38–126)
Anion gap: 5 (ref 5–15)
BUN: 12 mg/dL (ref 6–20)
CALCIUM: 8.7 mg/dL — AB (ref 8.9–10.3)
CO2: 29 mmol/L (ref 22–32)
CREATININE: 0.94 mg/dL (ref 0.44–1.00)
Chloride: 104 mmol/L (ref 101–111)
GFR calc Af Amer: >60 mL/min (ref >60)
GFR calc non Af Amer: >60 mL/min (ref >60)
GLUCOSE: 89 mg/dL (ref 65–99)
POTASSIUM: 3.8 mmol/L (ref 3.5–5.1)
Sodium: 138 mmol/L (ref 135–145)
TOTAL PROTEIN: 7.4 g/dL (ref 6.5–8.1)

## 2016-05-20 LAB — WET PREP, GENITAL
CLUE CELLS WET PREP: NONE SEEN
Sperm: NONE SEEN
TRICH WET PREP: NONE SEEN
Yeast Wet Prep HPF POC: NONE SEEN

## 2016-05-20 LAB — LIPASE, BLOOD: Lipase: 30 U/L (ref 11–51)

## 2016-05-20 LAB — CHLAMYDIA/NGC RT PCR (ARMC ONLY)
CHLAMYDIA TR: NOT DETECTED
N gonorrhoeae: NOT DETECTED

## 2016-05-20 MED ORDER — CIPROFLOXACIN HCL 500 MG PO TABS
500.0000 mg | ORAL_TABLET | Freq: Once | ORAL | Status: AC
  Administered 2016-05-20: 500 mg via ORAL
  Filled 2016-05-20: qty 1

## 2016-05-20 MED ORDER — IOPAMIDOL (ISOVUE-300) INJECTION 61%
100.0000 mL | Freq: Once | INTRAVENOUS | Status: AC | PRN
  Administered 2016-05-20: 100 mL via INTRAVENOUS
  Filled 2016-05-20: qty 100

## 2016-05-20 MED ORDER — METRONIDAZOLE 500 MG PO TABS
500.0000 mg | ORAL_TABLET | Freq: Once | ORAL | Status: AC
  Administered 2016-05-20: 500 mg via ORAL
  Filled 2016-05-20: qty 1

## 2016-05-20 MED ORDER — ONDANSETRON HCL 4 MG/2ML IJ SOLN
4.0000 mg | Freq: Once | INTRAMUSCULAR | Status: AC
  Administered 2016-05-20: 4 mg via INTRAVENOUS

## 2016-05-20 MED ORDER — HYDROCODONE-ACETAMINOPHEN 5-325 MG PO TABS
1.0000 | ORAL_TABLET | Freq: Once | ORAL | Status: AC
  Administered 2016-05-20: 1 via ORAL

## 2016-05-20 MED ORDER — HYDROMORPHONE HCL 1 MG/ML IJ SOLN
1.0000 mg | Freq: Once | INTRAMUSCULAR | Status: AC
  Administered 2016-05-20: 1 mg via INTRAVENOUS
  Filled 2016-05-20: qty 1

## 2016-05-20 MED ORDER — HYDROCODONE-ACETAMINOPHEN 5-325 MG PO TABS
ORAL_TABLET | ORAL | Status: AC
  Filled 2016-05-20: qty 1

## 2016-05-20 MED ORDER — ONDANSETRON HCL 4 MG/2ML IJ SOLN
INTRAMUSCULAR | Status: AC
  Filled 2016-05-20: qty 2

## 2016-05-20 MED ORDER — IBUPROFEN 600 MG PO TABS: 600.0000 mg | ORAL_TABLET | Freq: Three times a day (TID) | ORAL | 0 refills | Status: DC | PRN

## 2016-05-20 MED ORDER — IOPAMIDOL (ISOVUE-300) INJECTION 61%
30.0000 mL | Freq: Once | INTRAVENOUS | Status: AC | PRN
  Administered 2016-05-20: 30 mL via ORAL
  Filled 2016-05-20: qty 30

## 2016-05-20 MED ORDER — METRONIDAZOLE 500 MG PO TABS: 500.0000 mg | ORAL_TABLET | Freq: Two times a day (BID) | ORAL | 0 refills | Status: AC

## 2016-05-20 MED ORDER — CIPROFLOXACIN HCL 500 MG PO TABS: 500.0000 mg | ORAL_TABLET | Freq: Two times a day (BID) | ORAL | 0 refills | Status: AC

## 2016-05-20 MED ORDER — SODIUM CHLORIDE 0.9 % IV BOLUS (SEPSIS)
1000.0000 mL | Freq: Once | INTRAVENOUS | Status: AC
  Administered 2016-05-20: 1000 mL via INTRAVENOUS

## 2016-05-20 MED ORDER — MORPHINE SULFATE (PF) 4 MG/ML IV SOLN
4.0000 mg | Freq: Once | INTRAVENOUS | Status: AC
  Administered 2016-05-20: 4 mg via INTRAVENOUS

## 2016-05-20 MED ORDER — ONDANSETRON HCL 4 MG PO TABS: 4.0000 mg | ORAL_TABLET | Freq: Three times a day (TID) | ORAL | 0 refills | Status: AC | PRN

## 2016-05-20 MED ORDER — MORPHINE SULFATE (PF) 4 MG/ML IV SOLN
INTRAVENOUS | Status: AC
  Filled 2016-05-20: qty 1

## 2016-05-20 NOTE — Discharge Instructions (Signed)
You are being treated for colitis (inflammation or infection) of the large intestine colon with antibiotics cipro and flagyl.  Please follow up with a primary care doctor.  You were also found to have a cyst on the left ovary - please follow up with an ob gyn.  Return to the ER for any worsening pain, fever, black or bloody stool, or any other symptoms concerning to you.

## 2016-05-20 NOTE — ED Notes (Signed)
Pt reports lower back pain and lower abdominal pain that has gotten worse over the last three days

## 2016-05-20 NOTE — ED Provider Notes (Signed)
[facility] Emergency Department Provider Note ____________________________________________   I have reviewed the triage vital signs and the triage nursing note.  HISTORY  Chief Complaint Abdominal Pain   Historian Patient  HPI Ana [name] is a 50 y.o. female with a history of fibromyalgia, lupus, and hypertension here for low back pain for a few days, now lower pelvic pain as well.  Some nausea without vomiting.  No constipation or diarrhea.  No fevers.  No black or bloody stools.  Pain feels like cramping and has been constant, after gradual onset, and waxing and waning.  She's not sure if she's had similar symptoms before or not, but does report history of several CT scans in the past.  Pain is currently moderate in intensity.    Past Medical History:  Diagnosis Date  . Fibromyalgia   . Hypertension   . Lupus 1990  . Lupus 08/07/2015    Patient Active Problem List   Diagnosis Date Noted  . Chronic radicular cervical pain 12/10/2015  . Cervical spondylosis 12/10/2015  . Chronic pain 10/13/2015  . Long term current use of opiate analgesic 10/13/2015  . Long term prescription opiate use 10/13/2015  . Opiate use 10/13/2015  . Encounter for therapeutic drug level monitoring 10/13/2015  . Encounter for pain management planning 10/13/2015  . Chronic shoulder pain (Location of Primary Source of Pain) (Bilateral) (L>R) 10/13/2015  . Chronic Shoulder Radicular Pain 10/13/2015  . Chronic neck pain (Location of Secondary source of pain) (Bilateral) (R>L) 10/13/2015  . Chronic knee pain (Location of Tertiary source of pain) (Bilateral) (L>R) 10/13/2015  . Chronic hip pain (Bilateral) (L>R) 10/13/2015  . Chronic low back pain (Bilateral) (midline) (L>R) 10/13/2015  . Disturbance of skin sensation 10/13/2015  . Chronic hand pain (Left) (index finger) 10/13/2015  . Chronic hand pain (Right) (middle finger) 10/13/2015  . Abnormal MRI, cervical spine  (08/07/2015) 10/13/2015  . 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 CFIDS (chronic fatigue and immune dysfunction syndrome) ([place]) 08/13/2015  . N&V (nausea and vomiting) 08/13/2015  . Trochanteric bursitis of hip (Left) 08/13/2015  . Chronic fatigue, unspecified 08/13/2015  . Lupus 08/13/2015  . Nausea and vomiting in adult 08/13/2015  . Left arm weakness 08/07/2015  . Clinical depression 12/24/2014  . Cannot sleep 12/23/2013  . 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 Essential (primary) hypertension 07/08/2013  . Breast pain 07/08/2013  . Low serum cobalamin 04/29/2013  . Arthralgia of multiple joints 01/10/2013  . Fibromyalgia 12/11/2012  . Systemic lupus erythematosus ([place]) 12/11/2012  . Pancytopenia ([place]) 12/11/2012    Past Surgical History:  Procedure Laterality Date  . ABDOMINAL HYSTERECTOMY    . ANKLE SURGERY Right   . CESAREAN SECTION    . CHOLECYSTECTOMY    . right ankle surgery      Prior to Admission medications   Medication Sig Start Date End Date Taking? Authorizing Provider  Acetaminophen (PAIN RELIEF EXTRA STRENGTH) 500 MG coapsule Take 2 capsules by mouth every 6 (six) hours as needed for fever.    Historical Provider, MD  Aspirin-Caffeine 845-65 MG PACK Take 1 packet by mouth. Takes 4-8 per day    Historical Provider, MD  ciprofloxacin (CIPRO) 500 MG tablet Take 1 tablet (500 mg total) by mouth 2 (two) times daily. 05/20/16   Lisa Roca, MD  fluticasone (FLONASE) 50 MCG/ACT nasal spray 2 sprays by Each Nare route daily. 03/02/16 03/02/17  Historical Provider, MD  hydrochlorothiazide (HYDRODIURIL) 25 MG tablet TAKE ONE TABLET BY MOUTH  ONCE DAILY 02/02/16   Historical Provider, MD  HYDROcodone-acetaminophen (NORCO/VICODIN) 5-325 MG tablet Take 1 tablet by mouth every 6 (six) hours as needed for severe pain. 01/11/16   Milinda Pointer, MD  HYDROcodone-acetaminophen (NORCO/VICODIN) 5-325 MG tablet Take 1 tablet by mouth every 6 (six) hours as needed for severe  pain. 01/11/16   Milinda Pointer, MD  hydroxychloroquine (PLAQUENIL) 200 MG tablet Take by mouth 2 (two) times daily.    Historical Provider, MD  metroNIDAZOLE (FLAGYL) 500 MG tablet Take 1 tablet (500 mg total) by mouth 2 (two) times daily. 05/20/16   Lisa Roca, MD  nortriptyline (PAMELOR) 50 MG capsule Take 50 mg by mouth at bedtime.    Historical Provider, MD  traZODone (DESYREL) 100 MG tablet Take 100 mg by mouth at bedtime.  02/18/16   Historical Provider, MD    Allergies  Allergen Reactions  . Mirtazapine Other (See Comments)    hallucinations    Family History  Problem Relation Age of Onset  . Testicular cancer Father   . Diabetes Mother     Social History Social History  Substance Use Topics  . Smoking status: Never Smoker  . Smokeless tobacco: Never Used  . Alcohol use 1.2 oz/week    2 Cans of beer per week    Review of Systems  Constitutional: Negative for fever. Eyes: Negative for visual changes. ENT: Negative for sore throat. Cardiovascular: Negative for chest pain. Respiratory: Negative for shortness of breath. Gastrointestinal: Negative for vomiting and diarrhea. Genitourinary: Negative for dysuria.  Negative for vaginal pain, bleeding or discharge. Musculoskeletal: Positive for bilaterally lower back pain. Skin: Negative for rash. Neurological: Negative for headache, weakness or numbness. 10 point Review of Systems otherwise negative ____________________________________________   PHYSICAL EXAM:  VITAL SIGNS: ED Triage Vitals  Enc Vitals Group     BP 05/20/16 1452 (!) 143/79     Pulse Rate 05/20/16 1452 99     Resp 05/20/16 1452 18     Temp 05/20/16 1452 98.2 F (36.8 C)     Temp Source 05/20/16 1452 Oral     SpO2 05/20/16 1452 100 %     Weight 05/20/16 1452 185 lb (83.9 kg)     Height 05/20/16 1452 5\' 4"  (1.626 m)     Head Circumference --      Peak Flow --      Pain Score 05/20/16 1447 10     Pain Loc --      Pain Edu? --      Excl. in  [place]? --      Constitutional: Alert and oriented. Well appearing and in no distress. HEENT   Head: Normocephalic and atraumatic.      Eyes: Conjunctivae are normal. PERRL. Normal extraocular movements.      Ears:         Nose: No congestion/rhinnorhea.   Mouth/Throat: Mucous membranes are moist.   Neck: No stridor. Cardiovascular/Chest: Normal rate, regular rhythm.  No murmurs, rubs, or gallops. Respiratory: Normal respiratory effort without tachypnea nor retractions. Breath sounds are clear and equal bilaterally. No wheezes/rales/rhonchi. Gastrointestinal: Soft. She is holding the suprapubic area with her hands. Moderate tenderness in the suprapubic area.  No guarding or rebound.  No upper abdominal tenderness to palpation.  Genitourinary/rectal:  Small discharge, no cervix. Musculoskeletal: Nontender with normal range of motion in all extremities. No joint effusions.  No lower extremity tenderness.  No edema. Neurologic:  Normal speech and language. No gross or focal neurologic deficits are appreciated.  Skin:  Skin is warm, dry and intact. No rash noted. Psychiatric: Mood and affect are normal. Speech and behavior are normal. Patient exhibits appropriate insight and judgment.   ____________________________________________  LABS (pertinent positives/negatives)  Labs Reviewed  WET PREP, GENITAL - Abnormal; Notable for the following:       Result Value   WBC, Wet Prep HPF POC FEW (*)    All other components within normal limits  COMPREHENSIVE METABOLIC PANEL - Abnormal; Notable for the following:    Calcium 8.7 (*)    Total Bilirubin <0.1 (*)    All other components within normal limits  URINALYSIS, COMPLETE (UACMP) WITH MICROSCOPIC - Abnormal; Notable for the following:    Color, Urine YELLOW (*)    APPearance CLEAR (*)    Squamous Epithelial / LPF 0-5 (*)    All other components within normal limits  CHLAMYDIA/NGC RT PCR ([facility] ONLY)  LIPASE, BLOOD  CBC     ____________________________________________    EKG I, Lisa Roca, MD, the attending physician have personally viewed and interpreted all ECGs.  None ____________________________________________  RADIOLOGY All Xrays were viewed by me. Imaging interpreted by Radiologist.  CT abd and pelvis with contrast: IMPRESSION: 1. Minimal descending colonic wall thickening and adjacent stranding. This is indeterminate, but could indicate a mild degree of infectious colitis in the appropriate clinical context. 2. Otherwise, no focal abnormality of the abdomen or pelvis.  US pelvis/tv with doppler:  IMPRESSION: Hysterectomy.  Unremarkable ovaries with Doppler detected flow. __________________________________________  PROCEDURES  Procedure(s) performed: None  Critical Care performed: None  ____________________________________________   ED COURSE / ASSESSMENT AND PLAN  Pertinent labs & imaging results that were available during my care of the patient were reviewed by me and considered in my medical decision making (see chart for details).   Ms. Zacarias is here for low back and low pelvis/abd pain for 2 days, gradually worsening.  Stable VS and reassuring laboratory studies.  However, based on significant pain, we discussed treating with pain medications, doing pelvic exam and rexamining with respect to ct imaging.  I am not too keen to jump to Ct with her, given reported history of several CTs in the past, with reassuring lab eval here with several days of pain.  However, if not improved on reexamination, would consider benefit/risk for r/o emergency intraabd condition.  6:10 - on reexamination, pain had improved with initial pain medication, but now the pain is back. She is still holding her abdomen and is wincing in pain. We discussed again risk and benefit of CT scan, and chose to proceed given the severity of the pain.  Pelvic was performed, at that point she did let me note  that she has had a partial hysterectomy.  CT showing signs of colitis, discussed with patient, will treat with cipro and flagyl.  CT also showed left sided ovarian cyst with some pelvic fluid, given some left sided more so than right, I am going to u/s to r/o torsion.  She is much improved after second pain dose.  Ultrasound shows good blood flow, no evidence of torsion. Patient will be okay for discharge home now.    CONSULTATIONS:   None  Patient / Family / Caregiver informed of clinical course, medical decision-making process, and agree with plan.   I discussed return precautions, follow-up instructions, and discharge instructions with patient and/or family.   ___________________________________________   FINAL CLINICAL IMPRESSION(S) / ED DIAGNOSES   Final diagnoses:  Colitis  Left ovarian cyst  Note: This dictation was prepared with Dragon dictation. Any transcriptional errors that result from this process are unintentional    Lisa Roca, MD 05/20/16 2244

## 2016-05-20 NOTE — ED Triage Notes (Signed)
Pt comes into the ED via POV c/o abdominal pain and lower back pain that has been going on for 3 days.  Patient has had diarrhea accompanied with it but denies N/V or fevers.  Patient in NAD at this time with even and unlabored respirations. Patient states that she has had an increase in urination but only little has been voided.

## 2016-07-01 ENCOUNTER — Encounter: Payer: Self-pay | Admitting: Emergency Medicine

## 2016-07-01 ENCOUNTER — Emergency Department
Admission: EM | Admit: 2016-07-01 | Discharge: 2016-07-01 | Disposition: A | Discharge status: Home / Self Care | Payer: Medicare Other | Attending: Emergency Medicine | Admitting: Emergency Medicine

## 2016-07-01 DIAGNOSIS — R1032 Left lower quadrant pain: Secondary | ICD-10-CM | POA: Insufficient documentation

## 2016-07-01 DIAGNOSIS — R197 Diarrhea, unspecified: Secondary | ICD-10-CM | POA: Insufficient documentation

## 2016-07-01 DIAGNOSIS — R109 Unspecified abdominal pain: Secondary | ICD-10-CM

## 2016-07-01 DIAGNOSIS — R1031 Right lower quadrant pain: Secondary | ICD-10-CM | POA: Insufficient documentation

## 2016-07-01 DIAGNOSIS — I10 Essential (primary) hypertension: Secondary | ICD-10-CM | POA: Diagnosis not present

## 2016-07-01 DIAGNOSIS — Z7982 Long term (current) use of aspirin: Secondary | ICD-10-CM | POA: Insufficient documentation

## 2016-07-01 DIAGNOSIS — Z79899 Other long term (current) drug therapy: Secondary | ICD-10-CM | POA: Diagnosis not present

## 2016-07-01 LAB — COMPREHENSIVE METABOLIC PANEL
ALBUMIN: 4.5 g/dL (ref 3.5–5.0)
ALT: 13 U/L — AB (ref 14–54)
AST: 21 U/L (ref 15–41)
Alkaline Phosphatase: 68 U/L (ref 38–126)
Anion gap: 7 (ref 5–15)
BUN: 11 mg/dL (ref 6–20)
CHLORIDE: 103 mmol/L (ref 101–111)
CO2: 28 mmol/L (ref 22–32)
CREATININE: 0.91 mg/dL (ref 0.44–1.00)
Calcium: 8.9 mg/dL (ref 8.9–10.3)
GFR calc Af Amer: >60 mL/min (ref >60)
GLUCOSE: 87 mg/dL (ref 65–99)
POTASSIUM: 3.1 mmol/L — AB (ref 3.5–5.1)
SODIUM: 138 mmol/L (ref 135–145)
Total Bilirubin: 0.3 mg/dL (ref 0.3–1.2)
Total Protein: 8.6 g/dL — ABNORMAL HIGH (ref 6.5–8.1)

## 2016-07-01 LAB — CBC
HEMATOCRIT: 37.3 % (ref 35.0–47.0)
Hemoglobin: 13 g/dL (ref 12.0–16.0)
MCH: 31.3 pg (ref 26.0–34.0)
MCHC: 35 g/dL (ref 32.0–36.0)
MCV: 89.3 fL (ref 80.0–100.0)
PLATELETS: 255 10*3/uL (ref 150–440)
RBC: 4.17 MIL/uL (ref 3.80–5.20)
RDW: 12.7 % (ref 11.5–14.5)
WBC: 3.6 10*3/uL (ref 3.6–11.0)

## 2016-07-01 LAB — LIPASE, BLOOD: LIPASE: 25 U/L (ref 11–51)

## 2016-07-01 LAB — URINALYSIS, COMPLETE (UACMP) WITH MICROSCOPIC
BILIRUBIN URINE: NEGATIVE
Bacteria, UA: NONE SEEN
Glucose, UA: NEGATIVE mg/dL
Hgb urine dipstick: NEGATIVE
KETONES UR: NEGATIVE mg/dL
LEUKOCYTES UA: NEGATIVE
Nitrite: NEGATIVE
PH: 6 (ref 5.0–8.0)
Protein, ur: NEGATIVE mg/dL
SPECIFIC GRAVITY, URINE: 1.013 (ref 1.005–1.030)

## 2016-07-01 MED ORDER — SODIUM CHLORIDE 0.9 % IV BOLUS (SEPSIS)
1000.0000 mL | Freq: Once | INTRAVENOUS | Status: AC
  Administered 2016-07-01: 1000 mL via INTRAVENOUS

## 2016-07-01 MED ORDER — HYDROCODONE-ACETAMINOPHEN 5-325 MG PO TABS
2.0000 | ORAL_TABLET | Freq: Once | ORAL | Status: AC
  Administered 2016-07-01: 2 via ORAL
  Filled 2016-07-01: qty 2

## 2016-07-01 MED ORDER — MORPHINE SULFATE (PF) 4 MG/ML IV SOLN
4.0000 mg | Freq: Once | INTRAVENOUS | Status: AC
  Administered 2016-07-01: 4 mg via INTRAVENOUS
  Filled 2016-07-01: qty 1

## 2016-07-01 MED ORDER — ONDANSETRON 4 MG PO TBDP
4.0000 mg | ORAL_TABLET | Freq: Once | ORAL | Status: AC | PRN
  Administered 2016-07-01: 4 mg via ORAL
  Filled 2016-07-01: qty 1

## 2016-07-01 MED ORDER — ONDANSETRON 4 MG PO TBDP: 4.0000 mg | ORAL_TABLET | Freq: Three times a day (TID) | ORAL | 0 refills | Status: AC | PRN

## 2016-07-01 MED ORDER — ONDANSETRON HCL 4 MG/2ML IJ SOLN
4.0000 mg | Freq: Once | INTRAMUSCULAR | Status: AC
  Administered 2016-07-01: 4 mg via INTRAVENOUS
  Filled 2016-07-01: qty 2

## 2016-07-01 NOTE — ED Provider Notes (Signed)
[facility] Emergency Department Provider Note  Time seen: 12:20 PM  I have reviewed the triage vital signs and the nursing notes.   HISTORY  Chief Complaint Diarrhea; Back Pain; and Abdominal Pain    HPI Ana [name] is a 51 y.o. female with a past medical history of chronic pain, probable myalgia, lupus, hypertension who presents to the emergency department with lower abdominal pain radiating around to her bilateral lower back. Patient states the pain has been ongoing for multiple months, worse in the last month when she was evaluated in the emergency department. Patient had CT imaging showing possible colitis was treated with antibiotics. Patient was also diagnosed with an ovarian cyst. Patient was referred to a primary care doctor as well as a GYN. Patient states she has appointments but not until February. Patient states worsening pain and intermittent diarrhea over the past several months brought her to the emergency department today. Currently describes moderate dull aching pain across the lower abdomen radiating around both sides to her lower back. Denies dysuria, vaginal bleeding or discharge, patient is status post partial hysterectomy. Denies nausea, vomiting. Does state intermittent diarrhea but states this has been ongoing for multiple months. Denies any fever.  Past Medical History:  Diagnosis Date  . Fibromyalgia   . Hypertension   . Lupus 1990  . Lupus 08/07/2015    Patient Active Problem List   Diagnosis Date Noted  . Chronic radicular cervical pain 12/10/2015  . Cervical spondylosis 12/10/2015  . Chronic pain 10/13/2015  . Long term current use of opiate analgesic 10/13/2015  . Long term prescription opiate use 10/13/2015  . Opiate use 10/13/2015  . Encounter for therapeutic drug level monitoring 10/13/2015  . Encounter for pain management planning 10/13/2015  . Chronic shoulder pain (Location of Primary Source of Pain) (Bilateral) (L>R)  10/13/2015  . Chronic Shoulder Radicular Pain 10/13/2015  . Chronic neck pain (Location of Secondary source of pain) (Bilateral) (R>L) 10/13/2015  . Chronic knee pain (Location of Tertiary source of pain) (Bilateral) (L>R) 10/13/2015  . Chronic hip pain (Bilateral) (L>R) 10/13/2015  . Chronic low back pain (Bilateral) (midline) (L>R) 10/13/2015  . Disturbance of skin sensation 10/13/2015  . Chronic hand pain (Left) (index finger) 10/13/2015  . Chronic hand pain (Right) (middle finger) 10/13/2015  . Abnormal MRI, cervical spine (08/07/2015) 10/13/2015  . 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 CFIDS (chronic fatigue and immune dysfunction syndrome) ([place]) 08/13/2015  . N&V (nausea and vomiting) 08/13/2015  . Trochanteric bursitis of hip (Left) 08/13/2015  . Chronic fatigue, unspecified 08/13/2015  . Lupus 08/13/2015  . Nausea and vomiting in adult 08/13/2015  . Left arm weakness 08/07/2015  . Clinical depression 12/24/2014  . Cannot sleep 12/23/2013  . 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 Essential (primary) hypertension 07/08/2013  . Breast pain 07/08/2013  . Low serum cobalamin 04/29/2013  . Arthralgia of multiple joints 01/10/2013  . Fibromyalgia 12/11/2012  . Systemic lupus erythematosus ([place]) 12/11/2012  . Pancytopenia ([place]) 12/11/2012    Past Surgical History:  Procedure Laterality Date  . ABDOMINAL HYSTERECTOMY    . ANKLE SURGERY Right   . CESAREAN SECTION    . CHOLECYSTECTOMY    . right ankle surgery      Prior to Admission medications   Medication Sig Start Date End Date Taking? Authorizing Provider  Acetaminophen (PAIN RELIEF EXTRA STRENGTH) 500 MG coapsule Take 2 capsules by mouth every 6 (six) hours as needed for fever.    Historical Provider,  MD  Aspirin-Caffeine 845-65 MG PACK Take 1 packet by mouth. Takes 4-8 per day    Historical Provider, MD  ciprofloxacin (CIPRO) 500 MG tablet Take 1 tablet (500 mg total) by mouth 2 (two) times daily. 05/20/16    Lisa Roca, MD  fluticasone (FLONASE) 50 MCG/ACT nasal spray 2 sprays by Each Nare route daily. 03/02/16 03/02/17  Historical Provider, MD  hydrochlorothiazide (HYDRODIURIL) 25 MG tablet TAKE ONE TABLET BY MOUTH ONCE DAILY 02/02/16   Historical Provider, MD  HYDROcodone-acetaminophen (NORCO/VICODIN) 5-325 MG tablet Take 1 tablet by mouth every 6 (six) hours as needed for severe pain. 01/11/16   Milinda Pointer, MD  HYDROcodone-acetaminophen (NORCO/VICODIN) 5-325 MG tablet Take 1 tablet by mouth every 6 (six) hours as needed for severe pain. 01/11/16   Milinda Pointer, MD  hydroxychloroquine (PLAQUENIL) 200 MG tablet Take by mouth 2 (two) times daily.    Historical Provider, MD  ibuprofen (ADVIL,MOTRIN) 600 MG tablet Take 1 tablet (600 mg total) by mouth every 8 (eight) hours as needed. 05/20/16   Lisa Roca, MD  metroNIDAZOLE (FLAGYL) 500 MG tablet Take 1 tablet (500 mg total) by mouth 2 (two) times daily. 05/20/16   Lisa Roca, MD  nortriptyline (PAMELOR) 50 MG capsule Take 50 mg by mouth at bedtime.    Historical Provider, MD  ondansetron (ZOFRAN) 4 MG tablet Take 1 tablet (4 mg total) by mouth every 8 (eight) hours as needed for nausea or vomiting. 05/20/16   Lisa Roca, MD  traZODone (DESYREL) 100 MG tablet Take 100 mg by mouth at bedtime.  02/18/16   Historical Provider, MD    Allergies  Allergen Reactions  . Mirtazapine Other (See Comments)    hallucinations    Family History  Problem Relation Age of Onset  . Testicular cancer Father   . Diabetes Mother     Social History Social History  Substance Use Topics  . Smoking status: Never Smoker  . Smokeless tobacco: Never Used  . Alcohol use 1.2 oz/week    2 Cans of beer per week    Review of Systems Constitutional: Negative for fever. Cardiovascular: Negative for chest pain. Respiratory: Negative for shortness of breath. Gastrointestinal: Positive for lower abdominal pain radiating around to bilateral lower  back. Neurological: Negative for headache 10-point ROS otherwise negative.  ____________________________________________   PHYSICAL EXAM:  VITAL SIGNS: ED Triage Vitals [07/01/16 0843]  Enc Vitals Group     BP 126/80     Pulse Rate 91     Resp 15     Temp 98.2 F (36.8 C)     Temp Source Oral     SpO2 100 %     Weight 180 lb (81.6 kg)     Height 5\' 4"  (1.626 m)     Head Circumference      Peak Flow      Pain Score      Pain Loc      Pain Edu?      Excl. in [place]?     Constitutional: Alert and oriented. Well appearing and in no distress. Eyes: Normal exam ENT   Head: Normocephalic and atraumatic.   Mouth/Throat: Mucous membranes are moist. Cardiovascular: Normal rate, regular rhythm. No murmur Respiratory: Normal respiratory effort without tachypnea nor retractions. Breath sounds are clear  Gastrointestinal: Soft, moderate lower abdominal tenderness palpation. Bilateral flank tenderness, lower back tenderness. Musculoskeletal: Nontender with normal range of motion in all extremities.  Neurologic:  Normal speech and language. No gross focal neurologic deficits  Skin:  Skin is warm, dry and intact.  Psychiatric: Mood and affect are normal. ____________________________________________    INITIAL IMPRESSION / ASSESSMENT AND PLAN / ED COURSE  Pertinent labs & imaging results that were available during my care of the patient were reviewed by me and considered in my medical decision making (see chart for details).  The patient presents the emergency department with continued lower abdominal pain which has been ongoing for 2-3 months per patient. Patient states the pain radiates around to her back along with intermittent diarrhea which is been ongoing for this similar time frame. Patient was seen in December diagnose a possible colitis given CT findings other normal labs at that time. Normal labs today including normal white blood cell count. Minimal to moderate tenderness  palpation in the lower abdomen radiating around to bilateral lower back. Urinalysis is normal. I reviewed the patient's records the patient has a history of chronic pain and chronic opiate use. Patient was prescribed 120 tablets of hydrocodone each month until September when this prescription abruptly stopped. Patient is currently attempting to get in with a new primary care doctor. We will dose pain medication in the emergency department for symptom relief. As the patient's labs are within normal limits do not believe further imaging is required given the chronicity of her symptoms along with normal laboratory values. I discussed with the patient the need to follow-up with a primary care doctor. The patient is agreeable to this plan. She has an appointment scheduled for February.  ____________________________________________   FINAL CLINICAL IMPRESSION(S) / ED DIAGNOSES  Abdominal pain    Harvest Dark, MD 07/01/16 1224

## 2016-07-01 NOTE — ED Triage Notes (Signed)
Says diarrhea and intestinal swelling and back pain and low abd pain.  Says she has been seen weeks ago here for same, but her pcp cannot see her until next month.  Says she was dx with intestinal swelling and ovary cyst.

## 2016-07-01 NOTE — Discharge Instructions (Signed)
Please follow-up with her primary care doctor soon as possible for recheck/reevaluation and referral to a pain management specialist. Please take nausea medication as needed, as prescribed. Return to the emergency department for any fever, worsening abdominal pain or any other symptom personally concerning to yourself.

## 2016-11-30 DIAGNOSIS — I10 Essential (primary) hypertension: Secondary | ICD-10-CM | POA: Diagnosis not present

## 2016-11-30 DIAGNOSIS — A080 Rotaviral enteritis: Secondary | ICD-10-CM | POA: Diagnosis not present

## 2016-11-30 DIAGNOSIS — M791 Myalgia: Secondary | ICD-10-CM | POA: Diagnosis not present

## 2016-11-30 DIAGNOSIS — R1110 Vomiting, unspecified: Secondary | ICD-10-CM | POA: Insufficient documentation

## 2016-11-30 DIAGNOSIS — Z79899 Other long term (current) drug therapy: Secondary | ICD-10-CM | POA: Diagnosis not present

## 2016-11-30 DIAGNOSIS — R197 Diarrhea, unspecified: Secondary | ICD-10-CM | POA: Diagnosis present

## 2016-11-30 LAB — COMPREHENSIVE METABOLIC PANEL
ALBUMIN: 4.3 g/dL (ref 3.5–5.0)
ALK PHOS: 58 U/L (ref 38–126)
ALT: 18 U/L (ref 14–54)
AST: 23 U/L (ref 15–41)
Anion gap: 7 (ref 5–15)
BILIRUBIN TOTAL: 0.5 mg/dL (ref 0.3–1.2)
BUN: 14 mg/dL (ref 6–20)
CALCIUM: 8.7 mg/dL — AB (ref 8.9–10.3)
CO2: 28 mmol/L (ref 22–32)
CREATININE: 1.2 mg/dL — AB (ref 0.44–1.00)
Chloride: 100 mmol/L — ABNORMAL LOW (ref 101–111)
GFR calc non Af Amer: 52 mL/min — ABNORMAL LOW (ref >60)
GLUCOSE: 99 mg/dL (ref 65–99)
Potassium: 3.1 mmol/L — ABNORMAL LOW (ref 3.5–5.1)
SODIUM: 135 mmol/L (ref 135–145)
TOTAL PROTEIN: 8.2 g/dL — AB (ref 6.5–8.1)

## 2016-11-30 LAB — CBC
HCT: 38.2 % (ref 35.0–47.0)
Hemoglobin: 13.2 g/dL (ref 12.0–16.0)
MCH: 29.5 pg (ref 26.0–34.0)
MCHC: 34.5 g/dL (ref 32.0–36.0)
MCV: 85.5 fL (ref 80.0–100.0)
PLATELETS: 181 10*3/uL (ref 150–440)
RBC: 4.47 MIL/uL (ref 3.80–5.20)
RDW: 13.1 % (ref 11.5–14.5)
WBC: 4.2 10*3/uL (ref 3.6–11.0)

## 2016-11-30 LAB — LIPASE, BLOOD: Lipase: 24 U/L (ref 11–51)

## 2016-11-30 NOTE — ED Triage Notes (Signed)
Pt presents to ED via POV with c/o diarrhea, vomiting, and abdominal cramping x 2days. Pt reports 5 episodes of emesis and 3 episdoes of diarrhea in the last 24 hrs. Pt reports 10/10 cramping abdominal pain and generalized body aches. No known recent sick contacts, no OTC medications taken PTA.

## 2016-12-01 ENCOUNTER — Emergency Department: Payer: Medicare Other

## 2016-12-01 ENCOUNTER — Emergency Department
Admission: EM | Admit: 2016-12-01 | Discharge: 2016-12-01 | Disposition: A | Discharge status: Home / Self Care | Payer: Medicare Other | Attending: Emergency Medicine | Admitting: Emergency Medicine

## 2016-12-01 DIAGNOSIS — A080 Rotaviral enteritis: Secondary | ICD-10-CM

## 2016-12-01 LAB — URINALYSIS, COMPLETE (UACMP) WITH MICROSCOPIC
BILIRUBIN URINE: NEGATIVE
GLUCOSE, UA: NEGATIVE mg/dL
Ketones, ur: NEGATIVE mg/dL
Leukocytes, UA: NEGATIVE
NITRITE: NEGATIVE
Protein, ur: NEGATIVE mg/dL
SPECIFIC GRAVITY, URINE: 1.014 (ref 1.005–1.030)
pH: 5 (ref 5.0–8.0)

## 2016-12-01 LAB — GASTROINTESTINAL PANEL BY PCR, STOOL (REPLACES STOOL CULTURE)

## 2016-12-01 LAB — C DIFFICILE QUICK SCREEN W PCR REFLEX
C DIFFICILE (CDIFF) INTERP: NOT DETECTED
C DIFFICILE (CDIFF) TOXIN: NEGATIVE
C DIFFICLE (CDIFF) ANTIGEN: NEGATIVE

## 2016-12-01 MED ORDER — ONDANSETRON 4 MG PO TBDP: 4.0000 mg | ORAL_TABLET | Freq: Three times a day (TID) | ORAL | 0 refills | Status: AC | PRN

## 2016-12-01 MED ORDER — PROMETHAZINE HCL 25 MG/ML IJ SOLN
12.5000 mg | Freq: Once | INTRAMUSCULAR | Status: AC
  Administered 2016-12-01: 12.5 mg via INTRAVENOUS
  Filled 2016-12-01: qty 1

## 2016-12-01 MED ORDER — LOPERAMIDE HCL 2 MG PO TABS: 2.0000 mg | ORAL_TABLET | Freq: Four times a day (QID) | ORAL | 0 refills | Status: AC | PRN

## 2016-12-01 MED ORDER — ONDANSETRON HCL 4 MG/2ML IJ SOLN
INTRAMUSCULAR | Status: AC
  Filled 2016-12-01: qty 2

## 2016-12-01 MED ORDER — SODIUM CHLORIDE 0.9 % IV BOLUS (SEPSIS)
1000.0000 mL | Freq: Once | INTRAVENOUS | Status: AC
  Administered 2016-12-01: 1000 mL via INTRAVENOUS

## 2016-12-01 MED ORDER — MORPHINE SULFATE (PF) 2 MG/ML IV SOLN
2.0000 mg | Freq: Once | INTRAVENOUS | Status: AC
  Administered 2016-12-01: 2 mg via INTRAVENOUS

## 2016-12-01 MED ORDER — ONDANSETRON HCL 4 MG/2ML IJ SOLN
4.0000 mg | Freq: Once | INTRAMUSCULAR | Status: AC
  Administered 2016-12-01: 4 mg via INTRAVENOUS

## 2016-12-01 MED ORDER — DICYCLOMINE HCL 20 MG PO TABS: 20.0000 mg | ORAL_TABLET | Freq: Three times a day (TID) | ORAL | 0 refills | Status: AC | PRN

## 2016-12-01 MED ORDER — ONDANSETRON HCL 4 MG/2ML IJ SOLN
4.0000 mg | Freq: Once | INTRAMUSCULAR | Status: AC
  Administered 2016-12-01: 4 mg via INTRAVENOUS
  Filled 2016-12-01: qty 2

## 2016-12-01 MED ORDER — IOPAMIDOL (ISOVUE-300) INJECTION 61%: 30.0000 mL | Freq: Once | INTRAVENOUS | Status: DC

## 2016-12-01 MED ORDER — DICYCLOMINE HCL 10 MG PO CAPS
10.0000 mg | ORAL_CAPSULE | Freq: Once | ORAL | Status: AC
  Administered 2016-12-01: 10 mg via ORAL
  Filled 2016-12-01: qty 1

## 2016-12-01 MED ORDER — LOPERAMIDE HCL 2 MG PO CAPS
4.0000 mg | ORAL_CAPSULE | ORAL | Status: DC | PRN
  Administered 2016-12-01: 4 mg via ORAL
  Filled 2016-12-01: qty 2

## 2016-12-01 MED ORDER — MORPHINE SULFATE (PF) 2 MG/ML IV SOLN
INTRAVENOUS | Status: AC
Start: 2016-12-01 — End: 2016-12-01
  Administered 2016-12-01: 2 mg via INTRAVENOUS
  Filled 2016-12-01: qty 1

## 2016-12-01 NOTE — ED Notes (Signed)
2 unsuccessful PIV attempts by this RN (right forearm and right AC).

## 2016-12-01 NOTE — ED Provider Notes (Signed)
[facility] Emergency Department Provider Note   First MD Initiated Contact with Patient 12/01/16 [phone]     (approximate)  I have reviewed the triage vital signs and the nursing notes.   HISTORY  Chief Complaint Diarrhea; Emesis; and Generalized Body Aches  HPI Ana [name] is a 51 y.o. female with below list of chronic medical conditions including fibromyalgia and lupus presents to the emergency department with nonbloody diarrhea times one week accompanied by nausea and vomiting. Patient admits to generalized abdominal cramping with her current pain score 10 out of 10. Patient denies any fever.    Past Medical History:  Diagnosis Date  . Fibromyalgia   . Hypertension   . Lupus 1990  . Lupus 08/07/2015    Patient Active Problem List   Diagnosis Date Noted  . Chronic radicular cervical pain 12/10/2015  . Cervical spondylosis 12/10/2015  . Chronic pain 10/13/2015  . Long term current use of opiate analgesic 10/13/2015  . Long term prescription opiate use 10/13/2015  . Opiate use 10/13/2015  . Encounter for therapeutic drug level monitoring 10/13/2015  . Encounter for pain management planning 10/13/2015  . Chronic shoulder pain (Location of Primary Source of Pain) (Bilateral) (L>R) 10/13/2015  . Chronic Shoulder Radicular Pain 10/13/2015  . Chronic neck pain (Location of Secondary source of pain) (Bilateral) (R>L) 10/13/2015  . Chronic knee pain (Location of Tertiary source of pain) (Bilateral) (L>R) 10/13/2015  . Chronic hip pain (Bilateral) (L>R) 10/13/2015  . Chronic low back pain (Bilateral) (midline) (L>R) 10/13/2015  . Disturbance of skin sensation 10/13/2015  . Chronic hand pain (Left) (index finger) 10/13/2015  . Chronic hand pain (Right) (middle finger) 10/13/2015  . Abnormal MRI, cervical spine (08/07/2015) 10/13/2015  . 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 CFIDS (chronic fatigue and immune dysfunction syndrome) ([place])  08/13/2015  . N&V (nausea and vomiting) 08/13/2015  . Trochanteric bursitis of hip (Left) 08/13/2015  . Chronic fatigue, unspecified 08/13/2015  . Lupus 08/13/2015  . Nausea and vomiting in adult 08/13/2015  . Left arm weakness 08/07/2015  . Clinical depression 12/24/2014  . Cannot sleep 12/23/2013  . 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 Essential (primary) hypertension 07/08/2013  . Breast pain 07/08/2013  . Low serum cobalamin 04/29/2013  . Arthralgia of multiple joints 01/10/2013  . Fibromyalgia 12/11/2012  . Systemic lupus erythematosus ([place]) 12/11/2012  . Pancytopenia ([place]) 12/11/2012    Past Surgical History:  Procedure Laterality Date  . ABDOMINAL HYSTERECTOMY    . ANKLE SURGERY Right   . CESAREAN SECTION    . CHOLECYSTECTOMY    . right ankle surgery      Prior to Admission medications   Medication Sig Start Date End Date Taking? Authorizing Provider  Acetaminophen (PAIN RELIEF EXTRA STRENGTH) 500 MG coapsule Take 2 capsules by mouth every 6 (six) hours as needed for fever.    [provider]  Aspirin-Caffeine 845-65 MG PACK Take 1 packet by mouth. Takes 4-8 per day    [provider]  ciprofloxacin (CIPRO) 500 MG tablet Take 1 tablet (500 mg total) by mouth 2 (two) times daily. 05/20/16   Lisa Roca, MD  fluticasone (FLONASE) 50 MCG/ACT nasal spray 2 sprays by Each Nare route daily. 03/02/16 03/02/17  [provider]  hydrochlorothiazide (HYDRODIURIL) 25 MG tablet TAKE ONE TABLET BY MOUTH ONCE DAILY 02/02/16   [provider]  HYDROcodone-acetaminophen (NORCO/VICODIN) 5-325 MG tablet Take 1 tablet by mouth every 6 (six) hours as needed for severe pain.  01/11/16   Milinda Pointer, MD  HYDROcodone-acetaminophen (NORCO/VICODIN) 5-325 MG tablet Take 1 tablet by mouth every 6 (six) hours as needed for severe pain. 01/11/16   Milinda Pointer, MD  hydroxychloroquine (PLAQUENIL) 200 MG tablet Take by mouth 2 (two) times daily.    [provider]  ibuprofen (ADVIL,MOTRIN) 600 MG tablet Take 1 tablet (600 mg total) by mouth every 8 (eight) hours as needed. 05/20/16   Lisa Roca, MD  metroNIDAZOLE (FLAGYL) 500 MG tablet Take 1 tablet (500 mg total) by mouth 2 (two) times daily. 05/20/16   Lisa Roca, MD  nortriptyline (PAMELOR) 50 MG capsule Take 50 mg by mouth at bedtime.    [provider]  ondansetron (ZOFRAN ODT) 4 MG disintegrating tablet Take 1 tablet (4 mg total) by mouth every 8 (eight) hours as needed for nausea or vomiting. 07/01/16   Harvest Dark, MD  ondansetron (ZOFRAN) 4 MG tablet Take 1 tablet (4 mg total) by mouth every 8 (eight) hours as needed for nausea or vomiting. 05/20/16   Lisa Roca, MD  traZODone (DESYREL) 100 MG tablet Take 100 mg by mouth at bedtime.  02/18/16   [provider]    Allergies Mirtazapine  Family History  Problem Relation Age of Onset  . Testicular cancer Father   . Diabetes Mother     Social History Social History  Substance Use Topics  . Smoking status: Never Smoker  . Smokeless tobacco: Never Used  . Alcohol use 1.2 oz/week    2 Cans of beer per week    Review of Systems Constitutional: No fever/chills Eyes: No visual changes. ENT: No sore throat. Cardiovascular: Denies chest pain. Respiratory: Denies shortness of breath. Gastrointestinal: No abdominal pain.  No nausea, no vomiting.  No diarrhea.  No constipation. Genitourinary: Negative for dysuria. Musculoskeletal: Negative for neck pain.  Negative for back pain. Integumentary: Negative for rash. Neurological: Negative for headaches, focal weakness or numbness.   ____________________________________________   PHYSICAL EXAM:  VITAL SIGNS: ED Triage Vitals  Enc Vitals Group     BP 11/30/16 2127 126/86     Pulse Rate 11/30/16 2127 (!) 101     Resp 11/30/16 2127 18     Temp 11/30/16 2127 98.8 F (37.1 C)     Temp Source 11/30/16 2127 Oral     SpO2 11/30/16 2127 98 %      Weight 11/30/16 2119 81.6 kg (180 lb)     Height 11/30/16 2119 1.626 m (5\' 4" )     Head Circumference --      Peak Flow --      Pain Score 11/30/16 2118 10     Pain Loc --      Pain Edu? --      Excl. in [place]? --     Constitutional: Alert and oriented. Apparent discomfort Eyes: Conjunctivae are normal. Head: Atraumatic. Mouth/Throat: Mucous membranes are moist. Oropharynx non-erythematous. Neck: No stridor.   Cardiovascular: Normal rate, regular rhythm. Good peripheral circulation. Grossly normal heart sounds. Respiratory: Normal respiratory effort.  No retractions. Lungs CTAB. Gastrointestinal: Generalized tenderness to palpation. No distention. Musculoskeletal: No lower extremity tenderness nor edema. No gross deformities of extremities. Neurologic:  Normal speech and language. No gross focal neurologic deficits are appreciated.  Skin:  Skin is warm, dry and intact. No rash noted. Psychiatric: Mood and affect are normal. Speech and behavior are normal. ____________________________________________   LABS (all labs ordered are listed, but only abnormal results are displayed)  Labs Reviewed  GASTROINTESTINAL PANEL BY PCR, STOOL (REPLACES STOOL CULTURE) - Abnormal; Notable for the following:       Result Value   Rotavirus A DETECTED (*)    All other components within normal limits  COMPREHENSIVE METABOLIC PANEL - Abnormal; Notable for the following:    Potassium 3.1 (*)    Chloride 100 (*)    Creatinine, Ser 1.20 (*)    Calcium 8.7 (*)    Total Protein 8.2 (*)    GFR calc non Af Amer 52 (*)    All other components within normal limits  URINALYSIS, COMPLETE (UACMP) WITH MICROSCOPIC - Abnormal; Notable for the following:    Color, Urine YELLOW (*)    APPearance HAZY (*)    Hgb urine dipstick MODERATE (*)    Bacteria, UA RARE (*)    Squamous Epithelial / LPF 0-5 (*)    All other components within normal limits  C DIFFICILE QUICK SCREEN W PCR REFLEX  LIPASE, BLOOD  CBC      RADIOLOGY I, Yemassee N Shar Paez, personally viewed and evaluated these images (plain radiographs) as part of my medical decision making, as well as reviewing the written report by the radiologist.  Ct Abdomen Pelvis Wo Contrast  Result Date: 12/01/2016 CLINICAL DATA:  Diarrhea vomiting and abdominal pain for 2 days. EXAM: CT ABDOMEN AND PELVIS WITHOUT CONTRAST TECHNIQUE: Multidetector CT imaging of the abdomen and pelvis was performed following the standard protocol without IV contrast. COMPARISON:  05/20/2016 FINDINGS: Lower chest: No acute abnormality. Hepatobiliary: No focal liver abnormality is seen. Status post cholecystectomy. No biliary dilatation. Pancreas: Unremarkable. No pancreatic ductal dilatation or surrounding inflammatory changes. Spleen: Normal in size without focal abnormality. Adrenals/Urinary Tract: Adrenal glands are unremarkable. Kidneys are normal, without renal calculi, focal lesion, or hydronephrosis. Bladder is unremarkable. Stomach/Bowel: Stomach and small bowel are normal. Appendix is normal. There is a generous volume of fluid in the right hemicolon and there is moderate mural thickening of the cecum and ascending colon. This may represent colitis. No focal inflammation of bowel. No bowel obstruction. No extraluminal air. Vascular/Lymphatic: The abdominal aorta is normal in caliber with mild atherosclerotic calcification. No adenopathy in the abdomen or pelvis. Reproductive: Status post hysterectomy. No adnexal masses. Other: No ascites.  Small fat containing umbilical hernia. Musculoskeletal: No significant skeletal lesion. IMPRESSION: 1. Mural thickening of the cecum and ascending colon, with generous intraluminal fluid. This could represent colitis. No bowel obstruction or perforation. 2. Mild aortic atherosclerosis. 3. Small fat containing umbilical hernia. Electronically Signed   By: Andreas Newport M.D.   On: 12/01/2016 06:04      Procedures   ____________________________________________   INITIAL IMPRESSION / ASSESSMENT AND PLAN / ED COURSE  Pertinent labs & imaging results that were available during my care of the patient were reviewed by me and considered in my medical decision making (see chart for details).  Patient given 2 L IV normal saline, Imodium Zofran and Phenergan with improvement of pain nausea and diarrhea. Laboratory data and CT scan consistent with colitis most likely secondary to rotavirus given stool culture findings      ____________________________________________  FINAL CLINICAL IMPRESSION(S) / ED DIAGNOSES  Final diagnoses:  Rotavirus infection     MEDICATIONS GIVEN DURING THIS VISIT:  Medications  iopamidol (ISOVUE-300) 61 % injection 30 mL (not administered)  loperamide (IMODIUM) capsule 4 mg (4 mg Oral Given 12/01/16 0521)  sodium chloride 0.9 % bolus 1,000 mL (1,000 mLs Intravenous New Bag/Given 12/01/16 0206)  sodium chloride 0.9 %  bolus 1,000 mL (1,000 mLs Intravenous New Bag/Given 12/01/16 0210)  ondansetron (ZOFRAN) injection 4 mg (4 mg Intravenous Given 12/01/16 0208)  dicyclomine (BENTYL) capsule 10 mg (10 mg Oral Given 12/01/16 0210)  morphine 2 MG/ML injection 2 mg (2 mg Intravenous Given 12/01/16 0317)  ondansetron (ZOFRAN) injection 4 mg (4 mg Intravenous Given 12/01/16 0317)  promethazine (PHENERGAN) injection 12.5 mg (12.5 mg Intravenous Given 12/01/16 0522)     NEW OUTPATIENT MEDICATIONS STARTED DURING THIS VISIT:  New Prescriptions   No medications on file    Modified Medications   No medications on file    Discontinued Medications   No medications on file     Note:  This document was prepared using Dragon voice recognition software and may include unintentional dictation errors.    Gregor Hams, MD 12/01/16 [phone]

## 2017-07-10 ENCOUNTER — Emergency Department: Payer: No Typology Code available for payment source

## 2017-07-10 ENCOUNTER — Encounter: Payer: Self-pay | Admitting: Intensive Care

## 2017-07-10 ENCOUNTER — Emergency Department
Admission: EM | Admit: 2017-07-10 | Discharge: 2017-07-10 | Disposition: A | Discharge status: Home / Self Care | Payer: No Typology Code available for payment source | Attending: Emergency Medicine | Admitting: Emergency Medicine

## 2017-07-10 DIAGNOSIS — I10 Essential (primary) hypertension: Secondary | ICD-10-CM | POA: Insufficient documentation

## 2017-07-10 DIAGNOSIS — R413 Other amnesia: Secondary | ICD-10-CM | POA: Diagnosis not present

## 2017-07-10 DIAGNOSIS — Y939 Activity, unspecified: Secondary | ICD-10-CM | POA: Insufficient documentation

## 2017-07-10 DIAGNOSIS — Z7982 Long term (current) use of aspirin: Secondary | ICD-10-CM | POA: Insufficient documentation

## 2017-07-10 DIAGNOSIS — R109 Unspecified abdominal pain: Secondary | ICD-10-CM | POA: Diagnosis present

## 2017-07-10 DIAGNOSIS — Z79899 Other long term (current) drug therapy: Secondary | ICD-10-CM | POA: Insufficient documentation

## 2017-07-10 DIAGNOSIS — R0602 Shortness of breath: Secondary | ICD-10-CM | POA: Diagnosis not present

## 2017-07-10 DIAGNOSIS — Y92410 Unspecified street and highway as the place of occurrence of the external cause: Secondary | ICD-10-CM | POA: Diagnosis not present

## 2017-07-10 DIAGNOSIS — S301XXA Contusion of abdominal wall, initial encounter: Secondary | ICD-10-CM | POA: Diagnosis not present

## 2017-07-10 DIAGNOSIS — Z041 Encounter for examination and observation following transport accident: Secondary | ICD-10-CM | POA: Diagnosis not present

## 2017-07-10 DIAGNOSIS — Y999 Unspecified external cause status: Secondary | ICD-10-CM | POA: Diagnosis not present

## 2017-07-10 DIAGNOSIS — S20219A Contusion of unspecified front wall of thorax, initial encounter: Secondary | ICD-10-CM | POA: Diagnosis not present

## 2017-07-10 DIAGNOSIS — M542 Cervicalgia: Secondary | ICD-10-CM | POA: Insufficient documentation

## 2017-07-10 LAB — COMPREHENSIVE METABOLIC PANEL
ALBUMIN: 4.4 g/dL (ref 3.5–5.0)
ALK PHOS: 84 U/L (ref 38–126)
ALT: 34 U/L (ref 14–54)
AST: 35 U/L (ref 15–41)
Anion gap: 11 (ref 5–15)
BILIRUBIN TOTAL: 0.4 mg/dL (ref 0.3–1.2)
BUN: 12 mg/dL (ref 6–20)
CALCIUM: 9 mg/dL (ref 8.9–10.3)
CO2: 23 mmol/L (ref 22–32)
Chloride: 103 mmol/L (ref 101–111)
Creatinine, Ser: 1.06 mg/dL — ABNORMAL HIGH (ref 0.44–1.00)
GFR calc Af Amer: >60 mL/min (ref >60)
GFR calc non Af Amer: >60 mL/min (ref >60)
GLUCOSE: 102 mg/dL — AB (ref 65–99)
Potassium: 3.7 mmol/L (ref 3.5–5.1)
Sodium: 137 mmol/L (ref 135–145)
Total Protein: 8.4 g/dL — ABNORMAL HIGH (ref 6.5–8.1)

## 2017-07-10 LAB — CBC
HEMATOCRIT: 36.6 % (ref 35.0–47.0)
HEMOGLOBIN: 12.5 g/dL (ref 12.0–16.0)
MCH: 29.7 pg (ref 26.0–34.0)
MCHC: 34.2 g/dL (ref 32.0–36.0)
MCV: 86.9 fL (ref 80.0–100.0)
Platelets: 298 10*3/uL (ref 150–440)
RBC: 4.22 MIL/uL (ref 3.80–5.20)
RDW: 13.4 % (ref 11.5–14.5)
WBC: 9.5 10*3/uL (ref 3.6–11.0)

## 2017-07-10 MED ORDER — CYCLOBENZAPRINE HCL 5 MG PO TABS: ORAL_TABLET | ORAL | 0 refills | Status: AC

## 2017-07-10 MED ORDER — IOPAMIDOL (ISOVUE-300) INJECTION 61%
100.0000 mL | Freq: Once | INTRAVENOUS | Status: AC | PRN
  Administered 2017-07-10: 100 mL via INTRAVENOUS
  Filled 2017-07-10: qty 100

## 2017-07-10 MED ORDER — OXYCODONE-ACETAMINOPHEN 5-325 MG PO TABS
1.0000 | ORAL_TABLET | Freq: Once | ORAL | Status: AC
  Administered 2017-07-10: 1 via ORAL
  Filled 2017-07-10: qty 1

## 2017-07-10 MED ORDER — OXYCODONE-ACETAMINOPHEN 5-325 MG PO TABS: 1.0000 | ORAL_TABLET | ORAL | 0 refills | Status: AC | PRN

## 2017-07-10 MED ORDER — IBUPROFEN 600 MG PO TABS: 600.0000 mg | ORAL_TABLET | Freq: Four times a day (QID) | ORAL | 0 refills | Status: AC | PRN
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
## 2018-03-02 ENCOUNTER — Other Ambulatory Visit: Payer: Self-pay | Admitting: Orthopedic Surgery

## 2018-03-02 DIAGNOSIS — M25562 Pain in left knee: Secondary | ICD-10-CM

## 2018-03-02 DIAGNOSIS — M25362 Other instability, left knee: Secondary | ICD-10-CM

## 2018-03-24 ENCOUNTER — Ambulatory Visit
Admission: RE | Admit: 2018-03-24 | Discharge: 2018-03-24 | Disposition: A | Discharge status: Home / Self Care | Payer: Medicare HMO | Source: Ambulatory Visit | Attending: Orthopedic Surgery | Admitting: Orthopedic Surgery

## 2018-03-24 DIAGNOSIS — M25362 Other instability, left knee: Secondary | ICD-10-CM

## 2018-03-24 DIAGNOSIS — M25562 Pain in left knee: Secondary | ICD-10-CM

## 2018-03-24 DIAGNOSIS — M1712 Unilateral primary osteoarthritis, left knee: Secondary | ICD-10-CM | POA: Insufficient documentation

## 2019-05-05 ENCOUNTER — Other Ambulatory Visit: Payer: Self-pay

## 2019-05-05 ENCOUNTER — Emergency Department
Admission: EM | Admit: 2019-05-05 | Discharge: 2019-05-05 | Disposition: A | Discharge status: Home / Self Care | Payer: Medicare HMO | Attending: Emergency Medicine | Admitting: Emergency Medicine

## 2019-05-05 DIAGNOSIS — I10 Essential (primary) hypertension: Secondary | ICD-10-CM | POA: Insufficient documentation

## 2019-05-05 DIAGNOSIS — H5713 Ocular pain, bilateral: Secondary | ICD-10-CM | POA: Diagnosis present

## 2019-05-05 DIAGNOSIS — Z79899 Other long term (current) drug therapy: Secondary | ICD-10-CM | POA: Insufficient documentation

## 2019-05-05 DIAGNOSIS — H16001 Unspecified corneal ulcer, right eye: Secondary | ICD-10-CM | POA: Diagnosis not present

## 2019-05-05 MED ORDER — TETRACAINE HCL 0.5 % OP SOLN
2.0000 [drp] | Freq: Once | OPHTHALMIC | Status: DC
  Filled 2019-05-05: qty 4

## 2019-05-05 MED ORDER — FLUORESCEIN SODIUM 1 MG OP STRP
2.0000 | ORAL_STRIP | Freq: Once | OPHTHALMIC | Status: DC
  Filled 2019-05-05: qty 2

## 2019-05-05 MED ORDER — CIPROFLOXACIN HCL 0.3 % OP SOLN
2.0000 [drp] | Freq: Once | OPHTHALMIC | Status: AC
  Administered 2019-05-05: 20:00:00 2 [drp] via OPHTHALMIC
  Filled 2019-05-05: qty 2.5

## 2019-05-05 MED ORDER — MOXIFLOXACIN HCL 0.5 % OP SOLN: 1.0000 [drp] | Freq: Three times a day (TID) | OPHTHALMIC | 0 refills | Status: AC

## 2019-05-05 MED ORDER — MOXIFLOXACIN HCL 0.5 % OP SOLN
1.0000 [drp] | Freq: Three times a day (TID) | OPHTHALMIC | Status: DC
  Filled 2019-05-05: qty 3

## 2019-05-05 NOTE — Discharge Instructions (Signed)
You have a severe bacterial infection to your eyes.  Please use Vigamox eyedrops 3 times daily.  Please call Dr. Ronaldo Miyamoto office tomorrow morning for an appointment tomorrow.  I spoke with him tonight and he will be expecting to see you in office tomorrow.

## 2019-05-05 NOTE — ED Provider Notes (Signed)
[facility] Emergency Department Provider Note  ____________________________________________  Time seen: Approximately 6:48 PM  I have reviewed the triage vital signs and the nursing notes.   HISTORY  Chief Complaint Eye Problem    HPI Ana [name] is a 53 y.o. female that presents to the emergency department for evaluation of bilateral red painful eyes, worse on the right since this morning.  Right eye is watering.  Patient wears contacts and states that she wears them daily.  Patient states that she does not have good vision baseline and this has not changed.  No significant visual changes.  No double vision, floaters, flashers.  She does not work glasses.  She gets her contacts from [facility] in [place].  Eye pain is giving her a headache.  No trauma.  No nausea, vomiting.   Past Medical History:  Diagnosis Date  . Fibromyalgia   . Hypertension   . Lupus 1990  . Lupus 08/07/2015    Patient Active Problem List   Diagnosis Date Noted  . Chronic radicular cervical pain 12/10/2015  . Cervical spondylosis 12/10/2015  . Chronic pain 10/13/2015  . Long term current use of opiate analgesic 10/13/2015  . Long term prescription opiate use 10/13/2015  . Opiate use 10/13/2015  . Encounter for therapeutic drug level monitoring 10/13/2015  . Encounter for pain management planning 10/13/2015  . Chronic shoulder pain (Location of Primary Source of Pain) (Bilateral) (L>R) 10/13/2015  . Chronic Shoulder Radicular Pain 10/13/2015  . Chronic neck pain (Location of Secondary source of pain) (Bilateral) (R>L) 10/13/2015  . Chronic knee pain (Location of Tertiary source of pain) (Bilateral) (L>R) 10/13/2015  . Chronic hip pain (Bilateral) (L>R) 10/13/2015  . Chronic low back pain (Bilateral) (midline) (L>R) 10/13/2015  . Disturbance of skin sensation 10/13/2015  . Chronic hand pain (Left) (index finger) 10/13/2015  . Chronic hand pain (Right) (middle finger) 10/13/2015  .  Abnormal MRI, cervical spine (08/07/2015) 10/13/2015  . 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 CFIDS (chronic fatigue and immune dysfunction syndrome) ([place]) 08/13/2015  . N&V (nausea and vomiting) 08/13/2015  . Trochanteric bursitis of hip (Left) 08/13/2015  . Chronic fatigue, unspecified 08/13/2015  . Lupus ([place]) 08/13/2015  . Nausea and vomiting in adult 08/13/2015  . Left arm weakness 08/07/2015  . Clinical depression 12/24/2014  . Cannot sleep 12/23/2013  . 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 Essential (primary) hypertension 07/08/2013  . Breast pain 07/08/2013  . Low serum cobalamin 04/29/2013  . Arthralgia of multiple joints 01/10/2013  . Fibromyalgia 12/11/2012  . Systemic lupus erythematosus ([place]) 12/11/2012  . Pancytopenia ([place]) 12/11/2012    Past Surgical History:  Procedure Laterality Date  . ABDOMINAL HYSTERECTOMY    . ANKLE SURGERY Right   . CESAREAN SECTION    . CHOLECYSTECTOMY    . right ankle surgery      Prior to Admission medications   Medication Sig Start Date End Date Taking? Authorizing Provider  Acetaminophen (PAIN RELIEF EXTRA STRENGTH) 500 MG coapsule Take 2 capsules by mouth every 6 (six) hours as needed for fever.    [provider]  Aspirin-Caffeine 845-65 MG PACK Take 1 packet by mouth. Takes 4-8 per day    [provider]  ciprofloxacin (CIPRO) 500 MG tablet Take 1 tablet (500 mg total) by mouth 2 (two) times daily. 05/20/16   Lisa Roca, MD  cyclobenzaprine (FLEXERIL) 5 MG tablet Take 1-2 tablets 3 times daily as needed 07/10/17   Laban Emperor, PA-C  dicyclomine (  BENTYL) 20 MG tablet Take 1 tablet (20 mg total) by mouth 3 (three) times daily as needed for spasms. 12/01/16 12/01/17  Gregor Hams, MD  fluticasone (FLONASE) 50 MCG/ACT nasal spray 2 sprays by Each Nare route daily. 03/02/16 03/02/17  [provider]  hydrochlorothiazide (HYDRODIURIL) 25 MG tablet TAKE ONE TABLET BY MOUTH ONCE DAILY 02/02/16    [provider]  HYDROcodone-acetaminophen (NORCO/VICODIN) 5-325 MG tablet Take 1 tablet by mouth every 6 (six) hours as needed for severe pain. 01/11/16   Milinda Pointer, MD  HYDROcodone-acetaminophen (NORCO/VICODIN) 5-325 MG tablet Take 1 tablet by mouth every 6 (six) hours as needed for severe pain. 01/11/16   Milinda Pointer, MD  hydroxychloroquine (PLAQUENIL) 200 MG tablet Take by mouth 2 (two) times daily.    [provider]  ibuprofen (ADVIL,MOTRIN) 600 MG tablet Take 1 tablet (600 mg total) by mouth every 6 (six) hours as needed. 07/10/17   Laban Emperor, PA-C  loperamide (IMODIUM A-D) 2 MG tablet Take 1 tablet (2 mg total) by mouth 4 (four) times daily as needed for diarrhea or loose stools. 12/01/16   Gregor Hams, MD  metroNIDAZOLE (FLAGYL) 500 MG tablet Take 1 tablet (500 mg total) by mouth 2 (two) times daily. 05/20/16   Lisa Roca, MD  moxifloxacin (VIGAMOX) 0.5 % ophthalmic solution Place 1 drop into both eyes 3 (three) times daily for 7 days. 05/05/19 05/12/19  Laban Emperor, PA-C  nortriptyline (PAMELOR) 50 MG capsule Take 50 mg by mouth at bedtime.    [provider]  ondansetron (ZOFRAN ODT) 4 MG disintegrating tablet Take 1 tablet (4 mg total) by mouth every 8 (eight) hours as needed for nausea or vomiting. 07/01/16   Harvest Dark, MD  ondansetron (ZOFRAN ODT) 4 MG disintegrating tablet Take 1 tablet (4 mg total) by mouth every 8 (eight) hours as needed for nausea or vomiting. 12/01/16   Gregor Hams, MD  ondansetron (ZOFRAN) 4 MG tablet Take 1 tablet (4 mg total) by mouth every 8 (eight) hours as needed for nausea or vomiting. 05/20/16   Lisa Roca, MD  oxyCODONE-acetaminophen (PERCOCET) 5-325 MG tablet Take 1 tablet by mouth every 4 (four) hours as needed for severe pain. 07/10/17   Laban Emperor, PA-C  traZODone (DESYREL) 100 MG tablet Take 100 mg by mouth at bedtime.  02/18/16   [provider]     Allergies Mirtazapine  Family History  Problem Relation Age of Onset  . Testicular cancer Father   . Diabetes Mother     Social History Social History   Tobacco Use  . Smoking status: Never Smoker  . Smokeless tobacco: Never Used  Substance Use Topics  . Alcohol use: Yes    Alcohol/week: 2.0 standard drinks    Types: 2 Cans of beer per week  . Drug use: No     Review of Systems  Constitutional: No fever/chills Eyes: No visual changes.  Right watery discharge.  Painful red eyes. ENT: Negative for congestion and rhinorrhea. Respiratory: No SOB. Gastrointestinal: No nausea, no vomiting.  Musculoskeletal: Negative for musculoskeletal pain. Skin: Negative for rash, abrasions, lacerations, ecchymosis. Neurological: Positive for headache.   ____________________________________________   PHYSICAL EXAM:  VITAL SIGNS: ED Triage Vitals [05/05/19 1746]  Enc Vitals Group     BP (!) 168/109     Pulse Rate 90     Resp 18     Temp 98.4 F (36.9 C)     Temp Source Oral  SpO2 100 %     Weight 188 lb (85.3 kg)     Height 5\' 2"  (1.575 m)     Head Circumference      Peak Flow      Pain Score 10     Pain Loc      Pain Edu?      Excl. in [place]?      Constitutional: Alert and oriented. Well appearing and in no acute distress. Eyes: Conjunctivae are injected. PERRL. EOMI. watery discharge.  Punctate ulcers to the 12 PM and 5 PM position of right iris.  No additional defect on fluorescein stain.  Tonometer pressures less than 30.   Visual Acuity  Right Eye Distance: 20/100 Left Eye Distance: 20/100 Bilateral Distance: 20/70  Right Eye Near:   Left Eye Near:    Bilateral Near:    Head: Atraumatic. ENT:       Ears:       Nose: No congestion/rhinnorhea.      Mouth/Throat: Mucous membranes are moist.  Neck: No stridor.   Hematological/Lymphatic/Immunilogical: No cervical lymphadenopathy. Cardiovascular: Normal rate, regular rhythm.  Good peripheral  circulation. Respiratory: Normal respiratory effort without tachypnea or retractions. Lungs CTAB. Good air entry to the bases with no decreased or absent breath sounds. Gastrointestinal: Bowel sounds 4 quadrants. Soft and nontender to palpation. No guarding or rigidity. No palpable masses. No distention. Musculoskeletal: Full range of motion to all extremities. No gross deformities appreciated. Neurologic:  Normal speech and language. No gross focal neurologic deficits are appreciated.  Skin:  Skin is warm, dry and intact. No rash noted. Psychiatric: Mood and affect are normal. Speech and behavior are normal. Patient exhibits appropriate insight and judgement.   ____________________________________________   LABS (all labs ordered are listed, but only abnormal results are displayed)  Labs Reviewed - No data to display ____________________________________________  EKG   ____________________________________________  RADIOLOGY   No results found.  ____________________________________________    PROCEDURES  Procedure(s) performed:    Procedures    Medications  fluorescein ophthalmic strip 2 strip (has no administration in time range)  tetracaine (PONTOCAINE) 0.5 % ophthalmic solution 2 drop (has no administration in time range)  moxifloxacin (VIGAMOX) 0.5 % ophthalmic solution 1 drop (has no administration in time range)  ciprofloxacin (CILOXAN) 0.3 % ophthalmic solution 2 drop (2 drops Both Eyes Given 05/05/19 2015)     ____________________________________________   INITIAL IMPRESSION / ASSESSMENT AND PLAN / ED COURSE  Pertinent labs & imaging results that were available during my care of the patient were reviewed by me and considered in my medical decision making (see chart for details).  Review of the [place] CSRS was performed in accordance of the [place] prior to dispensing any controlled drugs.    Patient's diagnosis is consistent with corneal ulcer. Vital signs  and exam are reassuring.  Exam is consistent with corneal ulcer.  I spoke with Dr. Verlene Mayer about case on the phone who plans to follow-up with patient in clinic tomorrow.  Patient is agreeable.  Patient will be discharged home with prescriptions for Vigamox. Patient is to follow up with ophthalmology as needed or otherwise directed. Patient is given ED precautions to return to the ED for any worsening or new symptoms.   LANDON MOLESKI was evaluated in Emergency Department on 05/05/2019 for the symptoms described in the history of present illness. She was evaluated in the context of the global COVID-19 pandemic, which necessitated consideration that the patient might be at risk for  infection with the SARS-CoV-2 virus that causes COVID-19. Institutional protocols and algorithms that pertain to the evaluation of patients at risk for COVID-19 are in a state of rapid change based on information released by regulatory bodies including the CDC and federal and state organizations. These policies and algorithms were followed during the patient's care in the ED.  ____________________________________________  FINAL CLINICAL IMPRESSION(S) / ED DIAGNOSES  Final diagnoses:  Ulcer of right cornea      NEW MEDICATIONS STARTED DURING THIS VISIT:  ED Discharge Orders         Ordered    moxifloxacin (VIGAMOX) 0.5 % ophthalmic solution  3 times daily     05/05/19 2037              This chart was dictated using voice recognition software/Dragon. Despite best efforts to proofread, errors can occur which can change the meaning. Any change was purely unintentional.    Laban Emperor, PA-C 05/05/19 2244    Lavonia Drafts, MD 05/05/19 2249

## 2019-05-05 NOTE — ED Triage Notes (Signed)
Pt c/o bilateral eye pain, states she wears contacts but removed them yesterday.  Pt using visine and taking tylenol. Pt does noted some clear drainage out of the right eye.

## 2019-05-05 NOTE — ED Notes (Signed)
Pt had to leave immediately and was unable to sign for discharge. Pt left before vitals could be completed as well

## 2019-05-06 ENCOUNTER — Emergency Department
Admission: EM | Admit: 2019-05-06 | Discharge: 2019-05-06 | Disposition: A | Discharge status: Home / Self Care | Payer: Medicare HMO | Attending: Emergency Medicine | Admitting: Emergency Medicine

## 2019-05-06 ENCOUNTER — Other Ambulatory Visit: Payer: Self-pay

## 2019-05-06 ENCOUNTER — Encounter: Payer: Self-pay | Admitting: Emergency Medicine

## 2019-05-06 DIAGNOSIS — Z79899 Other long term (current) drug therapy: Secondary | ICD-10-CM | POA: Diagnosis not present

## 2019-05-06 DIAGNOSIS — H16003 Unspecified corneal ulcer, bilateral: Secondary | ICD-10-CM | POA: Insufficient documentation

## 2019-05-06 DIAGNOSIS — I10 Essential (primary) hypertension: Secondary | ICD-10-CM | POA: Diagnosis not present

## 2019-05-06 DIAGNOSIS — H5713 Ocular pain, bilateral: Secondary | ICD-10-CM | POA: Diagnosis present

## 2019-05-06 NOTE — ED Triage Notes (Signed)
Pt here with c/o bilateral ulcer and infection to right eye, infection also in left eye per pt, states her eyes are draining, both hurting, photosensitive. Was told to call eye dr in am because this condition could cause blindness, however eye dr states she has to make an appointment, pt states it's worse than yesterday. Covering both eyes in triage and unable to open due to pain/light.

## 2019-05-06 NOTE — ED Provider Notes (Signed)
[facility] Emergency Department Provider Note   ____________________________________________   First MD Initiated Contact with Patient 05/06/19 1016     (approximate)  I have reviewed the triage vital signs and the nursing notes.   HISTORY  Chief Complaint Eye Pain    HPI Ana [name] is a 53 y.o. female patient reported for bilateral eye pain secondary to corneal ulcers.  Patient was seen this facility yesterday and was told to follow-up with Dr. Neville Route the ophthalmologist.  Patient said her neighbor called and was told that she needs to make an appointment.  Discharge papers revealed that Dr.Harrow was contacted yesterday and said he will see the patient today.  Patient had no change in her condition even with using the eyedrops prescribed.      Past Medical History:  Diagnosis Date  . Fibromyalgia   . Hypertension   . Lupus ([place]) 1990  . Lupus ([place]) 08/07/2015    Patient Active Problem List   Diagnosis Date Noted  . Chronic radicular cervical pain 12/10/2015  . Cervical spondylosis 12/10/2015  . Chronic pain 10/13/2015  . Long term current use of opiate analgesic 10/13/2015  . Long term prescription opiate use 10/13/2015  . Opiate use 10/13/2015  . Encounter for therapeutic drug level monitoring 10/13/2015  . Encounter for pain management planning 10/13/2015  . Chronic shoulder pain (Location of Primary Source of Pain) (Bilateral) (L>R) 10/13/2015  . Chronic Shoulder Radicular Pain 10/13/2015  . Chronic neck pain (Location of Secondary source of pain) (Bilateral) (R>L) 10/13/2015  . Chronic knee pain (Location of Tertiary source of pain) (Bilateral) (L>R) 10/13/2015  . Chronic hip pain (Bilateral) (L>R) 10/13/2015  . Chronic low back pain (Bilateral) (midline) (L>R) 10/13/2015  . Disturbance of skin sensation 10/13/2015  . Chronic hand pain (Left) (index finger) 10/13/2015  . Chronic hand pain (Right) (middle finger) 10/13/2015  .  Abnormal MRI, cervical spine (08/07/2015) 10/13/2015  . 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 CFIDS (chronic fatigue and immune dysfunction syndrome) ([place]) 08/13/2015  . N&V (nausea and vomiting) 08/13/2015  . Trochanteric bursitis of hip (Left) 08/13/2015  . Chronic fatigue, unspecified 08/13/2015  . Lupus ([place]) 08/13/2015  . Nausea and vomiting in adult 08/13/2015  . Left arm weakness 08/07/2015  . Clinical depression 12/24/2014  . Cannot sleep 12/23/2013  . 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 Essential (primary) hypertension 07/08/2013  . Breast pain 07/08/2013  . Low serum cobalamin 04/29/2013  . Arthralgia of multiple joints 01/10/2013  . Fibromyalgia 12/11/2012  . Systemic lupus erythematosus ([place]) 12/11/2012  . Pancytopenia ([place]) 12/11/2012    Past Surgical History:  Procedure Laterality Date  . ABDOMINAL HYSTERECTOMY    . ANKLE SURGERY Right   . CESAREAN SECTION    . CHOLECYSTECTOMY    . right ankle surgery      Prior to Admission medications   Medication Sig Start Date End Date Taking? Authorizing Provider  Acetaminophen (PAIN RELIEF EXTRA STRENGTH) 500 MG coapsule Take 2 capsules by mouth every 6 (six) hours as needed for fever.    [provider]  Aspirin-Caffeine 845-65 MG PACK Take 1 packet by mouth. Takes 4-8 per day    [provider]  ciprofloxacin (CIPRO) 500 MG tablet Take 1 tablet (500 mg total) by mouth 2 (two) times daily. 05/20/16   Lisa Roca, MD  cyclobenzaprine (FLEXERIL) 5 MG tablet Take 1-2 tablets 3 times daily as needed 07/10/17   Laban Emperor, PA-C  dicyclomine (BENTYL) 20 MG  tablet Take 1 tablet (20 mg total) by mouth 3 (three) times daily as needed for spasms. 12/01/16 12/01/17  Gregor Hams, MD  fluticasone (FLONASE) 50 MCG/ACT nasal spray 2 sprays by Each Nare route daily. 03/02/16 03/02/17  [provider]  hydrochlorothiazide (HYDRODIURIL) 25 MG tablet TAKE ONE TABLET BY MOUTH ONCE DAILY 02/02/16    [provider]  HYDROcodone-acetaminophen (NORCO/VICODIN) 5-325 MG tablet Take 1 tablet by mouth every 6 (six) hours as needed for severe pain. 01/11/16   Milinda Pointer, MD  HYDROcodone-acetaminophen (NORCO/VICODIN) 5-325 MG tablet Take 1 tablet by mouth every 6 (six) hours as needed for severe pain. 01/11/16   Milinda Pointer, MD  hydroxychloroquine (PLAQUENIL) 200 MG tablet Take by mouth 2 (two) times daily.    [provider]  ibuprofen (ADVIL,MOTRIN) 600 MG tablet Take 1 tablet (600 mg total) by mouth every 6 (six) hours as needed. 07/10/17   Laban Emperor, PA-C  loperamide (IMODIUM A-D) 2 MG tablet Take 1 tablet (2 mg total) by mouth 4 (four) times daily as needed for diarrhea or loose stools. 12/01/16   Gregor Hams, MD  metroNIDAZOLE (FLAGYL) 500 MG tablet Take 1 tablet (500 mg total) by mouth 2 (two) times daily. 05/20/16   Lisa Roca, MD  moxifloxacin (VIGAMOX) 0.5 % ophthalmic solution Place 1 drop into both eyes 3 (three) times daily for 7 days. 05/05/19 05/12/19  Laban Emperor, PA-C  nortriptyline (PAMELOR) 50 MG capsule Take 50 mg by mouth at bedtime.    [provider]  ondansetron (ZOFRAN ODT) 4 MG disintegrating tablet Take 1 tablet (4 mg total) by mouth every 8 (eight) hours as needed for nausea or vomiting. 07/01/16   Harvest Dark, MD  ondansetron (ZOFRAN ODT) 4 MG disintegrating tablet Take 1 tablet (4 mg total) by mouth every 8 (eight) hours as needed for nausea or vomiting. 12/01/16   Gregor Hams, MD  ondansetron (ZOFRAN) 4 MG tablet Take 1 tablet (4 mg total) by mouth every 8 (eight) hours as needed for nausea or vomiting. 05/20/16   Lisa Roca, MD  oxyCODONE-acetaminophen (PERCOCET) 5-325 MG tablet Take 1 tablet by mouth every 4 (four) hours as needed for severe pain. 07/10/17   Laban Emperor, PA-C  traZODone (DESYREL) 100 MG tablet Take 100 mg by mouth at bedtime.  02/18/16   [provider]    Allergies Mirtazapine   Family History  Problem Relation Age of Onset  . Testicular cancer Father   . Diabetes Mother     Social History Social History   Tobacco Use  . Smoking status: Never Smoker  . Smokeless tobacco: Never Used  Substance Use Topics  . Alcohol use: Yes    Alcohol/week: 2.0 standard drinks    Types: 2 Cans of beer per week  . Drug use: No    Review of Systems Constitutional: No fever/chills Eyes: Bilateral eye pain. ENT: No sore throat. Cardiovascular: Denies chest pain. Respiratory: Denies shortness of breath. Gastrointestinal: No abdominal pain.  No nausea, no vomiting.  No diarrhea.  No constipation. Genitourinary: Negative for dysuria. Musculoskeletal: Negative for back pain. Skin: Negative for rash. Neurological: Negative for headaches, focal weakness or numbness. Psychiatric:  Depression Endocrine:  Hypertension, lupus, and fibromyalgia. Hematological/Lymphatic:  Allergic/Immunilogical: Loney Hering ____________________________________________   PHYSICAL EXAM:  VITAL SIGNS: ED Triage Vitals  Enc Vitals Group     BP 05/06/19 0955 (!) 183/108     Pulse Rate 05/06/19 0955 89     Resp 05/06/19 0955 18  Temp 05/06/19 0955 98.6 F (37 C)     Temp Source 05/06/19 0955 Oral     SpO2 05/06/19 0955 96 %     Weight --      Height --      Head Circumference --      Peak Flow --      Pain Score 05/06/19 1008 10     Pain Loc --      Pain Edu? --      Excl. in [place]? --     Constitutional: Alert and oriented.  Moderate distress.   Eyes: Conjunctivae are normal. PERRL. EOMI. deferred secondary being photophobic and pain. Cardiovascular: Normal rate, regular rhythm. Grossly normal heart sounds.  Good peripheral circulation.  Elevated blood pressure. Respiratory: Normal respiratory effort.  No retractions. Lungs CTAB. Neurologic:  Normal speech and language. No gross focal neurologic deficits are appreciated. No gait instability. Skin:  Skin is warm, dry and intact. No  rash noted. Psychiatric: Mood and affect are normal. Speech and behavior are normal.  ____________________________________________   LABS (all labs ordered are listed, but only abnormal results are displayed)  Labs Reviewed - No data to display ____________________________________________  EKG   ____________________________________________  RADIOLOGY  ED MD interpretation:    Official radiology report(s): No results found.  ____________________________________________   PROCEDURES  Procedure(s) performed (including Critical Care):  Procedures   ____________________________________________   INITIAL IMPRESSION / ASSESSMENT AND PLAN / ED COURSE  As part of my medical decision making, I reviewed the following data within the [place]      Patient presents with bilateral eye pain secondary to corneal ulcers.  Patient was confused about her follow-up which supposed to be at the ophthalmology clinic.  Called ophthalmology clinic today and was told to send the patient straight over.   LILLIEANNE ZAPPA was evaluated in Emergency Department on 05/06/2019 for the symptoms described in the history of present illness. She was evaluated in the context of the global COVID-19 pandemic, which necessitated consideration that the patient might be at risk for infection with the SARS-CoV-2 virus that causes COVID-19. Institutional protocols and algorithms that pertain to the evaluation of patients at risk for COVID-19 are in a state of rapid change based on information released by regulatory bodies including the CDC and federal and state organizations. These policies and algorithms were followed during the patient's care in the ED.        ____________________________________________   FINAL CLINICAL IMPRESSION(S) / ED DIAGNOSES  Final diagnoses:  Cornea ulcer, bilateral     ED Discharge Orders    None       Note:  This document was prepared using Dragon  voice recognition software and may include unintentional dictation errors.    Sable Feil, PA-C 05/06/19 1033    Carrie Mew, MD 05/10/19 1504

## 2019-05-06 NOTE — ED Notes (Signed)
I spoke with Ana [name] center  States they will see her now    Provider aware

## 2019-05-06 NOTE — Discharge Instructions (Addendum)
Advised to go to ophthalmology clinic after leaving the ED.  Clinic is expecting you.

## 2019-12-12 ENCOUNTER — Other Ambulatory Visit: Payer: Self-pay

## 2019-12-12 ENCOUNTER — Encounter: Payer: Self-pay | Admitting: Emergency Medicine

## 2019-12-12 ENCOUNTER — Emergency Department
Admission: EM | Admit: 2019-12-12 | Discharge: 2019-12-12 | Disposition: A | Discharge status: Home / Self Care | Payer: Medicare HMO | Attending: Emergency Medicine | Admitting: Emergency Medicine

## 2019-12-12 DIAGNOSIS — Z7982 Long term (current) use of aspirin: Secondary | ICD-10-CM | POA: Insufficient documentation

## 2019-12-12 DIAGNOSIS — K649 Unspecified hemorrhoids: Secondary | ICD-10-CM

## 2019-12-12 DIAGNOSIS — Z79899 Other long term (current) drug therapy: Secondary | ICD-10-CM | POA: Diagnosis not present

## 2019-12-12 DIAGNOSIS — K625 Hemorrhage of anus and rectum: Secondary | ICD-10-CM | POA: Diagnosis not present

## 2019-12-12 DIAGNOSIS — I10 Essential (primary) hypertension: Secondary | ICD-10-CM | POA: Diagnosis not present

## 2019-12-12 LAB — COMPREHENSIVE METABOLIC PANEL
ALT: 12 U/L (ref 0–44)
AST: 12 U/L — ABNORMAL LOW (ref 15–41)
Albumin: 4.1 g/dL (ref 3.5–5.0)
Alkaline Phosphatase: 86 U/L (ref 38–126)
Anion gap: 9 (ref 5–15)
BUN: 15 mg/dL (ref 6–20)
CO2: 29 mmol/L (ref 22–32)
Calcium: 9.1 mg/dL (ref 8.9–10.3)
Chloride: 101 mmol/L (ref 98–111)
Creatinine, Ser: 1.03 mg/dL — ABNORMAL HIGH (ref 0.44–1.00)
GFR calc Af Amer: >60 mL/min (ref >60)
GFR calc non Af Amer: >60 mL/min (ref >60)
Glucose, Bld: 96 mg/dL (ref 70–99)
Potassium: 4 mmol/L (ref 3.5–5.1)
Sodium: 139 mmol/L (ref 135–145)
Total Bilirubin: 0.6 mg/dL (ref 0.3–1.2)
Total Protein: 7.7 g/dL (ref 6.5–8.1)

## 2019-12-12 LAB — CBC
HCT: 33.4 % — ABNORMAL LOW (ref 36.0–46.0)
Hemoglobin: 11.1 g/dL — ABNORMAL LOW (ref 12.0–15.0)
MCH: 29.6 pg (ref 26.0–34.0)
MCHC: 33.2 g/dL (ref 30.0–36.0)
MCV: 89.1 fL (ref 80.0–100.0)
Platelets: 202 10*3/uL (ref 150–400)
RBC: 3.75 MIL/uL — ABNORMAL LOW (ref 3.87–5.11)
RDW: 15.2 % (ref 11.5–15.5)
WBC: 3 10*3/uL — ABNORMAL LOW (ref 4.0–10.5)
nRBC: 0 % (ref 0.0–0.2)

## 2019-12-12 MED ORDER — DOCUSATE SODIUM 100 MG PO CAPS: 100.0000 mg | ORAL_CAPSULE | Freq: Two times a day (BID) | ORAL | 2 refills | Status: AC

## 2019-12-12 MED ORDER — HYDROCORTISONE ACETATE 25 MG RE SUPP: 25.0000 mg | Freq: Two times a day (BID) | RECTAL | 1 refills | Status: AC

## 2019-12-12 NOTE — ED Provider Notes (Signed)
[facility] Emergency Department Provider Note   ____________________________________________   First MD Initiated Contact with Patient 12/12/19 1249     (approximate)  I have reviewed the triage vital signs and the nursing notes.   HISTORY  Chief Complaint Rectal Bleeding    HPI Ana [name] is a 54 y.o. female with past medical history of hypertension, lupus, and fibromyalgia who presents to the ED complaining of rectal bleeding.  Patient reports that she has been dealing with persistent bleeding from her rectum for about the past 5 days.  She has not noticed any blood in her stool but will have slow leakage of blood from her rectum throughout the day.  She denies any history of similar symptoms and has not had any rectal pain.  She is not anticoagulated, denies any lightheadedness, chest pain, or shortness of breath.        Past Medical History:  Diagnosis Date  . Fibromyalgia   . Hypertension   . Lupus ([place]) 1990  . Lupus ([place]) 08/07/2015    Patient Active Problem List   Diagnosis Date Noted  . Chronic radicular cervical pain 12/10/2015  . Cervical spondylosis 12/10/2015  . Chronic pain 10/13/2015  . Long term current use of opiate analgesic 10/13/2015  . Long term prescription opiate use 10/13/2015  . Opiate use 10/13/2015  . Encounter for therapeutic drug level monitoring 10/13/2015  . Encounter for pain management planning 10/13/2015  . Chronic shoulder pain (Location of Primary Source of Pain) (Bilateral) (L>R) 10/13/2015  . Chronic Shoulder Radicular Pain 10/13/2015  . Chronic neck pain (Location of Secondary source of pain) (Bilateral) (R>L) 10/13/2015  . Chronic knee pain (Location of Tertiary source of pain) (Bilateral) (L>R) 10/13/2015  . Chronic hip pain (Bilateral) (L>R) 10/13/2015  . Chronic low back pain (Bilateral) (midline) (L>R) 10/13/2015  . Disturbance of skin sensation 10/13/2015  . Chronic hand pain (Left) (index  finger) 10/13/2015  . Chronic hand pain (Right) (middle finger) 10/13/2015  . Abnormal MRI, cervical spine (08/07/2015) 10/13/2015  . 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 CFIDS (chronic fatigue and immune dysfunction syndrome) ([place]) 08/13/2015  . N&V (nausea and vomiting) 08/13/2015  . Trochanteric bursitis of hip (Left) 08/13/2015  . Chronic fatigue, unspecified 08/13/2015  . Lupus ([place]) 08/13/2015  . Nausea and vomiting in adult 08/13/2015  . Left arm weakness 08/07/2015  . Clinical depression 12/24/2014  . Cannot sleep 12/23/2013  . 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 Essential (primary) hypertension 07/08/2013  . Breast pain 07/08/2013  . Low serum cobalamin 04/29/2013  . Arthralgia of multiple joints 01/10/2013  . Fibromyalgia 12/11/2012  . Systemic lupus erythematosus ([place]) 12/11/2012  . Pancytopenia ([place]) 12/11/2012    Past Surgical History:  Procedure Laterality Date  . ABDOMINAL HYSTERECTOMY    . ANKLE SURGERY Right   . CESAREAN SECTION    . CHOLECYSTECTOMY    . right ankle surgery      Prior to Admission medications   Medication Sig Start Date End Date Taking? Authorizing Provider  Acetaminophen (PAIN RELIEF EXTRA STRENGTH) 500 MG coapsule Take 2 capsules by mouth every 6 (six) hours as needed for fever.    [provider]  Aspirin-Caffeine 845-65 MG PACK Take 1 packet by mouth. Takes 4-8 per day    [provider]  ciprofloxacin (CIPRO) 500 MG tablet Take 1 tablet (500 mg total) by mouth 2 (two) times daily. 05/20/16   Lisa Roca, MD  cyclobenzaprine (FLEXERIL) 5 MG tablet Take  1-2 tablets 3 times daily as needed 07/10/17   Laban Emperor, PA-C  dicyclomine (BENTYL) 20 MG tablet Take 1 tablet (20 mg total) by mouth 3 (three) times daily as needed for spasms. 12/01/16 12/01/17  Gregor Hams, MD  docusate sodium (COLACE) 100 MG capsule Take 1 capsule (100 mg total) by mouth 2 (two) times daily. 12/12/19 12/11/20  Blake Divine, MD   fluticasone (FLONASE) 50 MCG/ACT nasal spray 2 sprays by Each Nare route daily. 03/02/16 03/02/17  [provider]  hydrochlorothiazide (HYDRODIURIL) 25 MG tablet TAKE ONE TABLET BY MOUTH ONCE DAILY 02/02/16   [provider]  HYDROcodone-acetaminophen (NORCO/VICODIN) 5-325 MG tablet Take 1 tablet by mouth every 6 (six) hours as needed for severe pain. 01/11/16   Milinda Pointer, MD  HYDROcodone-acetaminophen (NORCO/VICODIN) 5-325 MG tablet Take 1 tablet by mouth every 6 (six) hours as needed for severe pain. 01/11/16   Milinda Pointer, MD  hydrocortisone (ANUSOL-HC) 25 MG suppository Place 1 suppository (25 mg total) rectally every 12 (twelve) hours. 12/12/19 12/11/20  Blake Divine, MD  hydroxychloroquine (PLAQUENIL) 200 MG tablet Take by mouth 2 (two) times daily.    [provider]  ibuprofen (ADVIL,MOTRIN) 600 MG tablet Take 1 tablet (600 mg total) by mouth every 6 (six) hours as needed. 07/10/17   Laban Emperor, PA-C  loperamide (IMODIUM A-D) 2 MG tablet Take 1 tablet (2 mg total) by mouth 4 (four) times daily as needed for diarrhea or loose stools. 12/01/16   Gregor Hams, MD  metroNIDAZOLE (FLAGYL) 500 MG tablet Take 1 tablet (500 mg total) by mouth 2 (two) times daily. 05/20/16   Lisa Roca, MD  nortriptyline (PAMELOR) 50 MG capsule Take 50 mg by mouth at bedtime.    [provider]  ondansetron (ZOFRAN ODT) 4 MG disintegrating tablet Take 1 tablet (4 mg total) by mouth every 8 (eight) hours as needed for nausea or vomiting. 07/01/16   Harvest Dark, MD  ondansetron (ZOFRAN ODT) 4 MG disintegrating tablet Take 1 tablet (4 mg total) by mouth every 8 (eight) hours as needed for nausea or vomiting. 12/01/16   Gregor Hams, MD  ondansetron (ZOFRAN) 4 MG tablet Take 1 tablet (4 mg total) by mouth every 8 (eight) hours as needed for nausea or vomiting. 05/20/16   Lisa Roca, MD  oxyCODONE-acetaminophen (PERCOCET) 5-325 MG tablet Take 1 tablet by  mouth every 4 (four) hours as needed for severe pain. 07/10/17   Laban Emperor, PA-C  traZODone (DESYREL) 100 MG tablet Take 100 mg by mouth at bedtime.  02/18/16   [provider]    Allergies Mirtazapine  Family History  Problem Relation Age of Onset  . Testicular cancer Father   . Diabetes Mother     Social History Social History   Tobacco Use  . Smoking status: Never Smoker  . Smokeless tobacco: Never Used  Substance Use Topics  . Alcohol use: Yes    Alcohol/week: 2.0 standard drinks    Types: 2 Cans of beer per week  . Drug use: No    Review of Systems  Constitutional: No fever/chills Eyes: No visual changes. ENT: No sore throat. Cardiovascular: Denies chest pain. Respiratory: Denies shortness of breath. Gastrointestinal: No abdominal pain.  No nausea, no vomiting.  No diarrhea.  No constipation.  Positive for rectal bleeding. Genitourinary: Negative for dysuria. Musculoskeletal: Negative for back pain. Skin: Negative for rash. Neurological: Negative for headaches, focal weakness or numbness.  ____________________________________________   PHYSICAL EXAM:  VITAL  SIGNS: ED Triage Vitals  Enc Vitals Group     BP 12/12/19 1027 123/79     Pulse Rate 12/12/19 1019 84     Resp 12/12/19 1019 18     Temp 12/12/19 1019 98.3 F (36.8 C)     Temp Source 12/12/19 1019 Oral     SpO2 12/12/19 1019 100 %     Weight 12/12/19 1019 172 lb (78 kg)     Height 12/12/19 1019 5\' 2"  (1.575 m)     Head Circumference --      Peak Flow --      Pain Score 12/12/19 1019 5     Pain Loc --      Pain Edu? --      Excl. in [place]? --     Constitutional: Alert and oriented. Eyes: Conjunctivae are normal. Head: Atraumatic. Nose: No congestion/rhinnorhea. Mouth/Throat: Mucous membranes are moist. Neck: Normal ROM Cardiovascular: Normal rate, regular rhythm. Grossly normal heart sounds. Respiratory: Normal respiratory effort.  No retractions. Lungs CTAB. Gastrointestinal:  Soft and nontender. No distention.  Hemorrhoid noted with signs of previous bleeding, clot overlying hemorrhoid with no active bleeding. Genitourinary: deferred Musculoskeletal: No lower extremity tenderness nor edema. Neurologic:  Normal speech and language. No gross focal neurologic deficits are appreciated. Skin:  Skin is warm, dry and intact. No rash noted. Psychiatric: Mood and affect are normal. Speech and behavior are normal.  ____________________________________________   LABS (all labs ordered are listed, but only abnormal results are displayed)  Labs Reviewed  COMPREHENSIVE METABOLIC PANEL - Abnormal; Notable for the following components:      Result Value   Creatinine, Ser 1.03 (*)    AST 12 (*)    All other components within normal limits  CBC - Abnormal; Notable for the following components:   WBC 3.0 (*)    RBC 3.75 (*)    Hemoglobin 11.1 (*)    HCT 33.4 (*)    All other components within normal limits  POC OCCULT BLOOD, ED  TYPE AND SCREEN    PROCEDURES  Procedure(s) performed (including Critical Care):  Procedures   ____________________________________________   INITIAL IMPRESSION / ASSESSMENT AND PLAN / ED COURSE       54 year old female with history of hypertension, chronic pain, and lupus who presents to the ED complaining of 5 days of rectal bleeding.  Rectal exam reveals hemorrhoid that appears to have previously bled but with overlying clot and no active bleeding.  Patient's lab work is reassuring, shows hemoglobin is improved from her recent visit at primary care last month.  I doubt significant GI bleeding and remainder of lab work is unremarkable.  Will prescribe Anusol as well as Colace for her apparent bleeding hemorrhoid and refer to general surgery for follow-up.  She was counseled to return to the ED for new or worsening symptoms, patient agrees with plan.      ____________________________________________   FINAL CLINICAL IMPRESSION(S) /  ED DIAGNOSES  Final diagnoses:  Rectal bleeding  Bleeding hemorrhoid     ED Discharge Orders         Ordered    hydrocortisone (ANUSOL-HC) 25 MG suppository  Every 12 hours     Discontinue  Reprint     12/12/19 1322    docusate sodium (COLACE) 100 MG capsule  2 times daily     Discontinue  Reprint     12/12/19 1322           Note:  This document was prepared  using Systems analyst and may include unintentional dictation errors.   Blake Divine, MD 12/12/19 1326

## 2019-12-12 NOTE — ED Triage Notes (Signed)
C/O rectal bleeding x 5 days.  AAOx3.  Skin warm and dry. NAD

## 2020-08-21 ENCOUNTER — Emergency Department: Payer: 59

## 2020-08-21 ENCOUNTER — Emergency Department
Admission: EM | Admit: 2020-08-21 | Discharge: 2020-08-22 | Disposition: A | Discharge status: Home / Self Care | Payer: 59 | Attending: Emergency Medicine | Admitting: Emergency Medicine

## 2020-08-21 ENCOUNTER — Other Ambulatory Visit: Payer: Self-pay

## 2020-08-21 DIAGNOSIS — I10 Essential (primary) hypertension: Secondary | ICD-10-CM | POA: Diagnosis not present

## 2020-08-21 DIAGNOSIS — R0602 Shortness of breath: Secondary | ICD-10-CM | POA: Insufficient documentation

## 2020-08-21 DIAGNOSIS — Z7982 Long term (current) use of aspirin: Secondary | ICD-10-CM | POA: Insufficient documentation

## 2020-08-21 DIAGNOSIS — R062 Wheezing: Secondary | ICD-10-CM | POA: Diagnosis not present

## 2020-08-21 DIAGNOSIS — G8929 Other chronic pain: Secondary | ICD-10-CM | POA: Diagnosis not present

## 2020-08-21 DIAGNOSIS — R079 Chest pain, unspecified: Secondary | ICD-10-CM | POA: Diagnosis present

## 2020-08-21 DIAGNOSIS — R0789 Other chest pain: Secondary | ICD-10-CM | POA: Insufficient documentation

## 2020-08-21 DIAGNOSIS — Z79899 Other long term (current) drug therapy: Secondary | ICD-10-CM | POA: Diagnosis not present

## 2020-08-21 LAB — ETHANOL: Alcohol, Ethyl (B): 128 mg/dL — ABNORMAL HIGH (ref <10)

## 2020-08-21 LAB — COMPREHENSIVE METABOLIC PANEL
ALT: 16 U/L (ref 0–44)
AST: 31 U/L (ref 15–41)
Albumin: 4.3 g/dL (ref 3.5–5.0)
Alkaline Phosphatase: 126 U/L (ref 38–126)
Anion gap: 8 (ref 5–15)
BUN: 10 mg/dL (ref 6–20)
CO2: 25 mmol/L (ref 22–32)
Calcium: 8.9 mg/dL (ref 8.9–10.3)
Chloride: 110 mmol/L (ref 98–111)
Creatinine, Ser: 1.19 mg/dL — ABNORMAL HIGH (ref 0.44–1.00)
GFR, Estimated: 55 mL/min — ABNORMAL LOW (ref >60)
Glucose, Bld: 82 mg/dL (ref 70–99)
Potassium: 3.2 mmol/L — ABNORMAL LOW (ref 3.5–5.1)
Sodium: 143 mmol/L (ref 135–145)
Total Bilirubin: 0.5 mg/dL (ref 0.3–1.2)
Total Protein: 7.9 g/dL (ref 6.5–8.1)

## 2020-08-21 LAB — CBC WITH DIFFERENTIAL/PLATELET
Abs Immature Granulocytes: 0.01 10*3/uL (ref 0.00–0.07)
Basophils Absolute: 0 10*3/uL (ref 0.0–0.1)
Basophils Relative: 1 %
Eosinophils Absolute: 0.1 10*3/uL (ref 0.0–0.5)
Eosinophils Relative: 4 %
HCT: 36.2 % (ref 36.0–46.0)
Hemoglobin: 12 g/dL (ref 12.0–15.0)
Immature Granulocytes: 0 %
Lymphocytes Relative: 30 %
Lymphs Abs: 0.9 10*3/uL (ref 0.7–4.0)
MCH: 28.7 pg (ref 26.0–34.0)
MCHC: 33.1 g/dL (ref 30.0–36.0)
MCV: 86.6 fL (ref 80.0–100.0)
Monocytes Absolute: 0.3 10*3/uL (ref 0.1–1.0)
Monocytes Relative: 10 %
Neutro Abs: 1.7 10*3/uL (ref 1.7–7.7)
Neutrophils Relative %: 55 %
Platelets: 232 10*3/uL (ref 150–400)
RBC: 4.18 MIL/uL (ref 3.87–5.11)
RDW: 14.3 % (ref 11.5–15.5)
WBC: 3.1 10*3/uL — ABNORMAL LOW (ref 4.0–10.5)
nRBC: 0 % (ref 0.0–0.2)

## 2020-08-21 LAB — LIPASE, BLOOD: Lipase: 46 U/L (ref 11–51)

## 2020-08-21 LAB — TROPONIN I (HIGH SENSITIVITY): Troponin I (High Sensitivity): 3 ng/L (ref <18)

## 2020-08-21 MED ORDER — IPRATROPIUM-ALBUTEROL 0.5-2.5 (3) MG/3ML IN SOLN
3.0000 mL | Freq: Once | RESPIRATORY_TRACT | Status: AC
  Administered 2020-08-21: 3 mL via RESPIRATORY_TRACT
  Filled 2020-08-21: qty 3

## 2020-08-21 NOTE — ED Triage Notes (Signed)
Patient arrived via EMS for chest pain that has been going on for a few weeks. Pt states the pain is in her left side and radiates to shoulder and down left arm. States she has been having a lot stress and has been drinking today.

## 2020-08-22 DIAGNOSIS — R0789 Other chest pain: Secondary | ICD-10-CM | POA: Diagnosis not present

## 2020-08-22 LAB — TROPONIN I (HIGH SENSITIVITY): Troponin I (High Sensitivity): 3 ng/L (ref <18)

## 2020-08-22 MED ORDER — ALBUTEROL SULFATE HFA 108 (90 BASE) MCG/ACT IN AERS: 2.0000 | INHALATION_SPRAY | RESPIRATORY_TRACT | 0 refills | Status: AC | PRN

## 2020-08-22 MED ORDER — LIDOCAINE VISCOUS HCL 2 % MT SOLN
15.0000 mL | Freq: Once | OROMUCOSAL | Status: AC
  Administered 2020-08-22: 15 mL via ORAL
  Filled 2020-08-22: qty 15

## 2020-08-22 MED ORDER — PREDNISONE 20 MG PO TABS: 40.0000 mg | ORAL_TABLET | Freq: Every day | ORAL | 0 refills | Status: AC

## 2020-08-22 MED ORDER — PREDNISONE 20 MG PO TABS
40.0000 mg | ORAL_TABLET | Freq: Once | ORAL | Status: AC
  Administered 2020-08-22: 40 mg via ORAL
  Filled 2020-08-22: qty 2

## 2020-08-22 MED ORDER — ALUM & MAG HYDROXIDE-SIMETH 200-200-20 MG/5ML PO SUSP
30.0000 mL | Freq: Once | ORAL | Status: AC
  Administered 2020-08-22: 30 mL via ORAL
  Filled 2020-08-22: qty 30

## 2020-08-22 MED ORDER — POTASSIUM CHLORIDE CRYS ER 20 MEQ PO TBCR
40.0000 meq | EXTENDED_RELEASE_TABLET | Freq: Once | ORAL | Status: AC
  Administered 2020-08-22: 40 meq via ORAL
  Filled 2020-08-22: qty 2

## 2020-08-22 MED ORDER — HYDROCODONE-ACETAMINOPHEN 5-325 MG PO TABS
1.0000 | ORAL_TABLET | Freq: Once | ORAL | Status: DC
Start: 2020-08-22 — End: 2020-08-22

## 2020-08-22 MED ORDER — PANTOPRAZOLE SODIUM 40 MG PO TBEC: 40.0000 mg | DELAYED_RELEASE_TABLET | Freq: Every day | ORAL | 0 refills | Status: AC

## 2020-08-22 NOTE — ED Provider Notes (Addendum)
[facility] Emergency Department Provider Note  ____________________________________________   Event Date/Time   First MD Initiated Contact with Patient 08/21/20 2145     (approximate)  I have reviewed the triage vital signs and the nursing notes.   HISTORY  Chief Complaint Chest Pain    HPI Ana [name] is a 55 y.o. female with history of hypertension, lupus, chronic pain, here with multiple complaints.  Patient states that she has been under significant recent stressors.  She states she was at home when she began to feel a dull, aching, left upper chest pain.  She felt like she could not breathe.  She states that she began walking across the street when she felt like she could not catch her breath.  She subsequent presents for evaluation.  She has a chronic chest pain as well as intermittent shortness of breath, which she states is due to a combination of her lupus as well as her underlying fibromyalgia.  She does state chest pain is slightly worse than usual.  Denies any recent fevers or chills.  No recent medication changes.  Her shortness of breath feels like she cannot catch her breath, though she has had no coughing.  She denies any fevers or sputum production.        Past Medical History:  Diagnosis Date  . Fibromyalgia   . Hypertension   . Lupus ([place]) 1990  . Lupus ([place]) 08/07/2015    Patient Active Problem List   Diagnosis Date Noted  . Chronic radicular cervical pain 12/10/2015  . Cervical spondylosis 12/10/2015  . Chronic pain 10/13/2015  . Long term current use of opiate analgesic 10/13/2015  . Long term prescription opiate use 10/13/2015  . Opiate use 10/13/2015  . Encounter for therapeutic drug level monitoring 10/13/2015  . Encounter for pain management planning 10/13/2015  . Chronic shoulder pain (Location of Primary Source of Pain) (Bilateral) (L>R) 10/13/2015  . Chronic Shoulder Radicular Pain 10/13/2015  . Chronic neck pain  (Location of Secondary source of pain) (Bilateral) (R>L) 10/13/2015  . Chronic knee pain (Location of Tertiary source of pain) (Bilateral) (L>R) 10/13/2015  . Chronic hip pain (Bilateral) (L>R) 10/13/2015  . Chronic low back pain (Bilateral) (midline) (L>R) 10/13/2015  . Disturbance of skin sensation 10/13/2015  . Chronic hand pain (Left) (index finger) 10/13/2015  . Chronic hand pain (Right) (middle finger) 10/13/2015  . Abnormal MRI, cervical spine (08/07/2015) 10/13/2015  . 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 CFIDS (chronic fatigue and immune dysfunction syndrome) ([place]) 08/13/2015  . N&V (nausea and vomiting) 08/13/2015  . Trochanteric bursitis of hip (Left) 08/13/2015  . Chronic fatigue, unspecified 08/13/2015  . Lupus ([place]) 08/13/2015  . Nausea and vomiting in adult 08/13/2015  . Left arm weakness 08/07/2015  . Clinical depression 12/24/2014  . Cannot sleep 12/23/2013  . 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 Essential (primary) hypertension 07/08/2013  . Breast pain 07/08/2013  . Low serum cobalamin 04/29/2013  . Arthralgia of multiple joints 01/10/2013  . Fibromyalgia 12/11/2012  . Systemic lupus erythematosus ([place]) 12/11/2012  . Pancytopenia ([place]) 12/11/2012    Past Surgical History:  Procedure Laterality Date  . ABDOMINAL HYSTERECTOMY    . ANKLE SURGERY Right   . CESAREAN SECTION    . CHOLECYSTECTOMY    . right ankle surgery      Prior to Admission medications   Medication Sig Start Date End Date Taking? Authorizing Provider  albuterol (VENTOLIN HFA) 108 (90 Base) MCG/ACT inhaler Inhale 2  puffs into the lungs every 4 (four) hours as needed for wheezing or shortness of breath. 08/22/20  Yes Duffy Bruce, MD  pantoprazole (PROTONIX) 40 MG tablet Take 1 tablet (40 mg total) by mouth daily for 14 days. 08/22/20 09/05/20 Yes Duffy Bruce, MD  predniSONE (DELTASONE) 20 MG tablet Take 2 tablets (40 mg total) by mouth daily for 5 days. 08/22/20 08/27/20 Yes  Duffy Bruce, MD  Acetaminophen (PAIN RELIEF EXTRA STRENGTH) 500 MG coapsule Take 2 capsules by mouth every 6 (six) hours as needed for fever.    [provider]  Aspirin-Caffeine 845-65 MG PACK Take 1 packet by mouth. Takes 4-8 per day    [provider]  ciprofloxacin (CIPRO) 500 MG tablet Take 1 tablet (500 mg total) by mouth 2 (two) times daily. 05/20/16   Lisa Roca, MD  cyclobenzaprine (FLEXERIL) 5 MG tablet Take 1-2 tablets 3 times daily as needed 07/10/17   Laban Emperor, PA-C  dicyclomine (BENTYL) 20 MG tablet Take 1 tablet (20 mg total) by mouth 3 (three) times daily as needed for spasms. 12/01/16 12/01/17  Gregor Hams, MD  docusate sodium (COLACE) 100 MG capsule Take 1 capsule (100 mg total) by mouth 2 (two) times daily. 12/12/19 12/11/20  Blake Divine, MD  fluticasone (FLONASE) 50 MCG/ACT nasal spray 2 sprays by Each Nare route daily. 03/02/16 03/02/17  [provider]  hydrochlorothiazide (HYDRODIURIL) 25 MG tablet TAKE ONE TABLET BY MOUTH ONCE DAILY 02/02/16   [provider]  HYDROcodone-acetaminophen (NORCO/VICODIN) 5-325 MG tablet Take 1 tablet by mouth every 6 (six) hours as needed for severe pain. 01/11/16   Milinda Pointer, MD  HYDROcodone-acetaminophen (NORCO/VICODIN) 5-325 MG tablet Take 1 tablet by mouth every 6 (six) hours as needed for severe pain. 01/11/16   Milinda Pointer, MD  hydrocortisone (ANUSOL-HC) 25 MG suppository Place 1 suppository (25 mg total) rectally every 12 (twelve) hours. 12/12/19 12/11/20  Blake Divine, MD  hydroxychloroquine (PLAQUENIL) 200 MG tablet Take by mouth 2 (two) times daily.    [provider]  ibuprofen (ADVIL,MOTRIN) 600 MG tablet Take 1 tablet (600 mg total) by mouth every 6 (six) hours as needed. 07/10/17   Laban Emperor, PA-C  loperamide (IMODIUM A-D) 2 MG tablet Take 1 tablet (2 mg total) by mouth 4 (four) times daily as needed for diarrhea or loose stools. 12/01/16   Gregor Hams, MD   metroNIDAZOLE (FLAGYL) 500 MG tablet Take 1 tablet (500 mg total) by mouth 2 (two) times daily. 05/20/16   Lisa Roca, MD  nortriptyline (PAMELOR) 50 MG capsule Take 50 mg by mouth at bedtime.    [provider]  ondansetron (ZOFRAN ODT) 4 MG disintegrating tablet Take 1 tablet (4 mg total) by mouth every 8 (eight) hours as needed for nausea or vomiting. 07/01/16   Harvest Dark, MD  ondansetron (ZOFRAN ODT) 4 MG disintegrating tablet Take 1 tablet (4 mg total) by mouth every 8 (eight) hours as needed for nausea or vomiting. 12/01/16   Gregor Hams, MD  ondansetron (ZOFRAN) 4 MG tablet Take 1 tablet (4 mg total) by mouth every 8 (eight) hours as needed for nausea or vomiting. 05/20/16   Lisa Roca, MD  oxyCODONE-acetaminophen (PERCOCET) 5-325 MG tablet Take 1 tablet by mouth every 4 (four) hours as needed for severe pain. 07/10/17   Laban Emperor, PA-C  traZODone (DESYREL) 100 MG tablet Take 100 mg by mouth at bedtime.  02/18/16   [provider]    Allergies Mirtazapine  Family History  Problem Relation Age of Onset  . Testicular cancer Father   . Diabetes Mother     Social History Social History   Tobacco Use  . Smoking status: Never Smoker  . Smokeless tobacco: Never Used  Substance Use Topics  . Alcohol use: Yes    Alcohol/week: 2.0 standard drinks    Types: 2 Cans of beer per week  . Drug use: No    Review of Systems  Review of Systems  Constitutional: Positive for fatigue. Negative for fever.  HENT: Negative for congestion and sore throat.   Eyes: Negative for visual disturbance.  Respiratory: Positive for chest tightness and shortness of breath. Negative for cough.   Cardiovascular: Positive for chest pain.  Gastrointestinal: Negative for abdominal pain, diarrhea, nausea and vomiting.  Genitourinary: Negative for flank pain.  Musculoskeletal: Negative for back pain and neck pain.  Skin: Negative for rash and wound.  Neurological:  Negative for weakness.  All other systems reviewed and are negative.    ____________________________________________  PHYSICAL EXAM:      VITAL SIGNS: ED Triage Vitals  Enc Vitals Group     BP 08/21/20 2106 (!) 154/96     Pulse Rate 08/21/20 2106 89     Resp 08/21/20 2106 15     Temp 08/21/20 2103 98.6 F (37 C)     Temp Source 08/21/20 2103 Oral     SpO2 08/21/20 2057 100 %     Weight 08/21/20 2106 165 lb (74.8 kg)     Height 08/21/20 2106 5\' 2"  (1.575 m)     Head Circumference --      Peak Flow --      Pain Score 08/21/20 2106 10     Pain Loc --      Pain Edu? --      Excl. in [place]? --      Physical Exam Vitals and nursing note reviewed.  Constitutional:      General: She is not in acute distress.    Appearance: She is well-developed.  HENT:     Head: Normocephalic and atraumatic.  Eyes:     Conjunctiva/sclera: Conjunctivae normal.  Cardiovascular:     Rate and Rhythm: Normal rate and regular rhythm.     Heart sounds: Normal heart sounds. No murmur heard. No friction rub.  Pulmonary:     Effort: Pulmonary effort is normal. No respiratory distress.     Breath sounds: Wheezing (scant, expiratory) present. No rales.  Abdominal:     General: There is no distension.     Palpations: Abdomen is soft.     Tenderness: There is no abdominal tenderness.  Musculoskeletal:     Cervical back: Neck supple.  Skin:    General: Skin is warm.     Capillary Refill: Capillary refill takes less than 2 seconds.  Neurological:     Mental Status: She is alert and oriented to person, place, and time.     Motor: No abnormal muscle tone.       ____________________________________________   LABS (all labs ordered are listed, but only abnormal results are displayed)  Labs Reviewed  CBC WITH DIFFERENTIAL/PLATELET - Abnormal; Notable for the following components:      Result Value   WBC 3.1 (*)    All other components within normal limits  COMPREHENSIVE METABOLIC PANEL -  Abnormal; Notable for the following components:   Potassium 3.2 (*)    Creatinine, Ser 1.19 (*)    GFR, Estimated 55 (*)  All other components within normal limits  ETHANOL - Abnormal; Notable for the following components:   Alcohol, Ethyl (B) 128 (*)    All other components within normal limits  LIPASE, BLOOD  TROPONIN I (HIGH SENSITIVITY)  TROPONIN I (HIGH SENSITIVITY)    ____________________________________________  EKG: Normal sinus rhythm, VR 93. PR 184, QRS 95, QTc 426. No acute St elevation or depressions. No ischemia or infarct. ________________________________________  RADIOLOGY All imaging, including plain films, CT scans, and ultrasounds, independently reviewed by me, and interpretations confirmed via formal radiology reads.  ED MD interpretation:   CXR: Clear  Official radiology report(s): DG Chest 2 View  Result Date: 08/21/2020 CLINICAL DATA:  Shortness of breath, chest pain EXAM: CHEST - 2 VIEW COMPARISON:  07/10/2017 FINDINGS: The heart size and mediastinal contours are within normal limits. Both lungs are clear. The visualized skeletal structures are unremarkable. IMPRESSION: Normal study. Electronically Signed   By: Rolm Baptise M.D.   On: 08/21/2020 23:27    ____________________________________________  PROCEDURES   Procedure(s) performed (including Critical Care):  .1-3 Lead EKG Interpretation Performed by: Duffy Bruce, MD Authorized by: Duffy Bruce, MD     Interpretation: normal     ECG rate:  70-90s   ECG rate assessment: normal     Rhythm: sinus rhythm     Ectopy: none     Conduction: normal   Comments:     Indication: Chest pain    ____________________________________________  INITIAL IMPRESSION / MDM / [place] / ED COURSE  As part of my medical decision making, I reviewed the following data within the [place] notes reviewed and incorporated, Old chart reviewed, Notes from prior ED  visits, and [place] Controlled Substance Database       *Ana [name] was evaluated in Emergency Department on 08/22/2020 for the symptoms described in the history of present illness. She was evaluated in the context of the global COVID-19 pandemic, which necessitated consideration that the patient might be at risk for infection with the SARS-CoV-2 virus that causes COVID-19. Institutional protocols and algorithms that pertain to the evaluation of patients at risk for COVID-19 are in a state of rapid change based on information released by regulatory bodies including the CDC and federal and state organizations. These policies and algorithms were followed during the patient's care in the ED.  Some ED evaluations and interventions may be delayed as a result of limited staffing during the pandemic.*     Medical Decision Making:  55 yo F here with atypical CP, SOB. Suspect major component of her underlying chronic pain, as well as anxiety in setting of recent stressors. EKG nonischemic, initial trop neg, low suspicion for ACS. No s/s PE and she is not tachycardic, tachypneic. Pt also endorses EtOH use which could be causing some gastritis/epigastric discomfort. Will plan for delta trop given her age, treat for possible asthma exacerbation, and plan to d/c. CXR clear. Labs o/w very reassuring. Normal LFTs, lipase.   Plan to f/u repeat trop, d/c if negative. Meds called in including PPI, prednisone and albuterol.   ____________________________________________  FINAL CLINICAL IMPRESSION(S) / ED DIAGNOSES  Final diagnoses:  Atypical chest pain  Shortness of breath     MEDICATIONS GIVEN DURING THIS VISIT:  Medications  predniSONE (DELTASONE) tablet 40 mg (has no administration in time range)  alum & mag hydroxide-simeth (MAALOX/MYLANTA) 200-200-20 MG/5ML suspension 30 mL (has no administration in time range)    And  lidocaine (XYLOCAINE) 2 %  viscous mouth solution 15 mL (has no administration in time  range)  ipratropium-albuterol (DUONEB) 0.5-2.5 (3) MG/3ML nebulizer solution 3 mL (3 mLs Nebulization Given 08/21/20 2343)  ipratropium-albuterol (DUONEB) 0.5-2.5 (3) MG/3ML nebulizer solution 3 mL (3 mLs Nebulization Given 08/21/20 2342)     ED Discharge Orders         Ordered    pantoprazole (PROTONIX) 40 MG tablet  Daily        08/22/20 0026    predniSONE (DELTASONE) 20 MG tablet  Daily        08/22/20 0026    albuterol (VENTOLIN HFA) 108 (90 Base) MCG/ACT inhaler  Every 4 hours PRN        08/22/20 0026           Note:  This document was prepared using Dragon voice recognition software and may include unintentional dictation errors.   Duffy Bruce, MD 08/22/20 1314    Duffy Bruce, MD 08/22/20 [phone]

## 2020-08-22 NOTE — Discharge Instructions (Addendum)
Use the albuterol as needed for shortness of breath  Take the full course of steroids  The protonix is an antacid that may help as well - take this in the morning, 20 minutes before food
